# Patient Record
Sex: Male | Born: 1940
Health system: Southern US, Community
[De-identification: ages and names within clinical notes are randomized; demographics above are authoritative.]

## PROBLEM LIST (undated history)

## (undated) DIAGNOSIS — Z9289 Personal history of other medical treatment: Secondary | ICD-10-CM

## (undated) DIAGNOSIS — R001 Bradycardia, unspecified: Secondary | ICD-10-CM

## (undated) DIAGNOSIS — I209 Angina pectoris, unspecified: Secondary | ICD-10-CM

## (undated) DIAGNOSIS — Z85828 Personal history of other malignant neoplasm of skin: Secondary | ICD-10-CM

## (undated) DIAGNOSIS — M1990 Unspecified osteoarthritis, unspecified site: Secondary | ICD-10-CM

## (undated) DIAGNOSIS — L570 Actinic keratosis: Secondary | ICD-10-CM

## (undated) DIAGNOSIS — T148XXA Other injury of unspecified body region, initial encounter: Secondary | ICD-10-CM

## (undated) DIAGNOSIS — I2510 Atherosclerotic heart disease of native coronary artery without angina pectoris: Secondary | ICD-10-CM

## (undated) DIAGNOSIS — I10 Essential (primary) hypertension: Secondary | ICD-10-CM

## (undated) DIAGNOSIS — E785 Hyperlipidemia, unspecified: Secondary | ICD-10-CM

## (undated) DIAGNOSIS — I350 Nonrheumatic aortic (valve) stenosis: Secondary | ICD-10-CM

## (undated) DIAGNOSIS — Z8701 Personal history of pneumonia (recurrent): Secondary | ICD-10-CM

## (undated) HISTORY — DX: Hyperlipidemia, unspecified: E78.5

## (undated) HISTORY — DX: Actinic keratosis: L57.0

## (undated) HISTORY — PX: EYE SURGERY: SHX253

## (undated) HISTORY — DX: Essential (primary) hypertension: I10

## (undated) HISTORY — PX: INGUINAL HERNIA REPAIR: SUR1180

## (undated) HISTORY — DX: Personal history of pneumonia (recurrent): Z87.01

## (undated) HISTORY — DX: Atherosclerotic heart disease of native coronary artery without angina pectoris: I25.10

## (undated) HISTORY — DX: Other injury of unspecified body region, initial encounter: T14.8XXA

## (undated) HISTORY — DX: Nonrheumatic aortic (valve) stenosis: I35.0

## (undated) HISTORY — DX: Personal history of other medical treatment: Z92.89

## (undated) HISTORY — DX: Personal history of other malignant neoplasm of skin: Z85.828

---
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
## 2002-09-14 ENCOUNTER — Encounter: Payer: Self-pay | Admitting: Family Medicine

## 2002-09-14 ENCOUNTER — Encounter: Admission: RE | Admit: 2002-09-14 | Discharge: 2002-09-14 | Payer: Self-pay | Admitting: Family Medicine

## 2002-09-17 ENCOUNTER — Encounter: Admission: RE | Admit: 2002-09-17 | Discharge: 2002-09-17 | Payer: Self-pay | Admitting: Family Medicine

## 2002-09-17 ENCOUNTER — Encounter: Payer: Self-pay | Admitting: Family Medicine

## 2004-04-13 ENCOUNTER — Encounter: Admission: RE | Admit: 2004-04-13 | Discharge: 2004-04-13 | Payer: Self-pay | Admitting: Family Medicine

## 2007-10-13 ENCOUNTER — Encounter: Admission: RE | Admit: 2007-10-13 | Discharge: 2007-10-13 | Payer: Self-pay | Admitting: General Surgery

## 2007-10-17 ENCOUNTER — Encounter (INDEPENDENT_AMBULATORY_CARE_PROVIDER_SITE_OTHER): Payer: Self-pay | Admitting: General Surgery

## 2007-10-17 ENCOUNTER — Ambulatory Visit (HOSPITAL_BASED_OUTPATIENT_CLINIC_OR_DEPARTMENT_OTHER): Admission: RE | Admit: 2007-10-17 | Discharge: 2007-10-17 | Payer: Self-pay | Admitting: General Surgery

## 2008-12-09 ENCOUNTER — Ambulatory Visit: Payer: Self-pay | Admitting: Gastroenterology

## 2008-12-25 ENCOUNTER — Ambulatory Visit: Payer: Self-pay | Admitting: Gastroenterology

## 2010-08-18 ENCOUNTER — Encounter: Payer: Self-pay | Admitting: Cardiology

## 2010-12-11 ENCOUNTER — Encounter: Payer: Self-pay | Admitting: Cardiology

## 2010-12-22 ENCOUNTER — Encounter: Payer: Self-pay | Admitting: Cardiology

## 2010-12-23 ENCOUNTER — Encounter: Payer: Self-pay | Admitting: Cardiology

## 2010-12-23 ENCOUNTER — Ambulatory Visit
Admission: RE | Admit: 2010-12-23 | Discharge: 2010-12-23 | Payer: Self-pay | Source: Home / Self Care | Attending: Cardiology | Admitting: Cardiology

## 2010-12-23 DIAGNOSIS — R079 Chest pain, unspecified: Secondary | ICD-10-CM | POA: Insufficient documentation

## 2010-12-23 DIAGNOSIS — R0989 Other specified symptoms and signs involving the circulatory and respiratory systems: Secondary | ICD-10-CM | POA: Insufficient documentation

## 2010-12-23 DIAGNOSIS — R011 Cardiac murmur, unspecified: Secondary | ICD-10-CM | POA: Insufficient documentation

## 2011-01-04 ENCOUNTER — Telehealth (INDEPENDENT_AMBULATORY_CARE_PROVIDER_SITE_OTHER): Payer: Self-pay | Admitting: Radiology

## 2011-01-05 ENCOUNTER — Other Ambulatory Visit: Payer: Self-pay | Admitting: Cardiology

## 2011-01-05 ENCOUNTER — Ambulatory Visit
Admission: RE | Admit: 2011-01-05 | Discharge: 2011-01-05 | Payer: Self-pay | Source: Home / Self Care | Attending: Cardiology | Admitting: Cardiology

## 2011-01-05 ENCOUNTER — Encounter: Payer: Self-pay | Admitting: Cardiology

## 2011-01-05 ENCOUNTER — Ambulatory Visit: Admission: RE | Admit: 2011-01-05 | Discharge: 2011-01-05 | Payer: Self-pay | Source: Home / Self Care

## 2011-01-05 ENCOUNTER — Encounter (HOSPITAL_COMMUNITY)
Admission: RE | Admit: 2011-01-05 | Discharge: 2011-01-19 | Payer: Self-pay | Source: Home / Self Care | Attending: Cardiology | Admitting: Cardiology

## 2011-01-05 ENCOUNTER — Ambulatory Visit (HOSPITAL_COMMUNITY)
Admission: RE | Admit: 2011-01-05 | Discharge: 2011-01-05 | Payer: Self-pay | Source: Home / Self Care | Attending: Cardiology | Admitting: Cardiology

## 2011-01-05 ENCOUNTER — Encounter (INDEPENDENT_AMBULATORY_CARE_PROVIDER_SITE_OTHER): Payer: Self-pay | Admitting: *Deleted

## 2011-01-05 DIAGNOSIS — R9430 Abnormal result of cardiovascular function study, unspecified: Secondary | ICD-10-CM | POA: Insufficient documentation

## 2011-01-05 DIAGNOSIS — I359 Nonrheumatic aortic valve disorder, unspecified: Secondary | ICD-10-CM | POA: Insufficient documentation

## 2011-01-05 LAB — CBC WITH DIFFERENTIAL/PLATELET
Basophils Absolute: 0 10*3/uL (ref 0.0–0.1)
Basophils Relative: 0.4 % (ref 0.0–3.0)
Eosinophils Absolute: 0.1 10*3/uL (ref 0.0–0.7)
Eosinophils Relative: 1.8 % (ref 0.0–5.0)
HCT: 41 % (ref 39.0–52.0)
Hemoglobin: 14.2 g/dL (ref 13.0–17.0)
Lymphocytes Relative: 25.8 % (ref 12.0–46.0)
Lymphs Abs: 2.1 10*3/uL (ref 0.7–4.0)
MCHC: 34.6 g/dL (ref 30.0–36.0)
MCV: 95 fl (ref 78.0–100.0)
Monocytes Absolute: 0.9 10*3/uL (ref 0.1–1.0)
Monocytes Relative: 11 % (ref 3.0–12.0)
Neutro Abs: 5 10*3/uL (ref 1.4–7.7)
Neutrophils Relative %: 61 % (ref 43.0–77.0)
Platelets: 216 10*3/uL (ref 150.0–400.0)
RBC: 4.32 Mil/uL (ref 4.22–5.81)
RDW: 13.6 % (ref 11.5–14.6)
WBC: 8.3 10*3/uL (ref 4.5–10.5)

## 2011-01-05 LAB — BASIC METABOLIC PANEL
BUN: 14 mg/dL (ref 6–23)
CO2: 30 mEq/L (ref 19–32)
Calcium: 9.7 mg/dL (ref 8.4–10.5)
Chloride: 103 mEq/L (ref 96–112)
Creatinine, Ser: 1.1 mg/dL (ref 0.4–1.5)
GFR: 74.28 mL/min (ref >60.00)
Glucose, Bld: 88 mg/dL (ref 70–99)
Potassium: 5.3 mEq/L — ABNORMAL HIGH (ref 3.5–5.1)
Sodium: 139 mEq/L (ref 135–145)

## 2011-01-05 LAB — PROTIME-INR
INR: 1 ratio (ref 0.8–1.0)
Prothrombin Time: 11.3 s (ref 10.2–12.4)

## 2011-01-05 LAB — APTT: aPTT: 30.1 s — ABNORMAL HIGH (ref 21.7–28.8)

## 2011-01-06 ENCOUNTER — Telehealth: Payer: Self-pay | Admitting: Cardiology

## 2011-01-07 ENCOUNTER — Other Ambulatory Visit: Payer: Self-pay | Admitting: Cardiology

## 2011-01-07 DIAGNOSIS — I719 Aortic aneurysm of unspecified site, without rupture: Secondary | ICD-10-CM

## 2011-01-12 ENCOUNTER — Telehealth: Payer: Self-pay | Admitting: Cardiology

## 2011-01-12 ENCOUNTER — Ambulatory Visit
Admission: RE | Admit: 2011-01-12 | Payer: Self-pay | Source: Home / Self Care | Attending: Cardiology | Admitting: Cardiology

## 2011-01-21 ENCOUNTER — Other Ambulatory Visit: Payer: Self-pay | Admitting: Cardiology

## 2011-01-21 DIAGNOSIS — I719 Aortic aneurysm of unspecified site, without rupture: Secondary | ICD-10-CM

## 2011-01-21 NOTE — Miscellaneous (Signed)
  Clinical Lists Changes  Observations: Added new observation of CT SCAN:  IMPRESSION:   1.  Diverticula in the distal descending colon with minimal   surrounding strandiness suggestive of a very mild diverticulitis.  No   abscess.   2.  Degenerative change in the lower lumbar spine.  (04/13/2004 16:31)      Allergies: 1)  ! Pcn    CT Scan  Procedure date:  04/13/2004  Findings:       IMPRESSION:   1.  Diverticula in the distal descending colon with minimal   surrounding strandiness suggestive of a very mild diverticulitis.  No   abscess.   2.  Degenerative change in the lower lumbar spine.

## 2011-01-21 NOTE — Letter (Signed)
Summary: [facility] - Office Note  [facility] - Office Note   Imported By: Marylou Mccoy 12/22/2010 16:54:15  _____________________________________________________________________  External Attachment:    Type:   Image     Comment:   External Document

## 2011-01-21 NOTE — Letter (Signed)
Summary: [facility] - Office Note  [facility] - Office Note   Imported By: Marylou Mccoy 12/22/2010 16:53:05  _____________________________________________________________________  External Attachment:    Type:   Image     Comment:   External Document

## 2011-01-21 NOTE — Assessment & Plan Note (Signed)
Summary: Cardiology Nuclear Testing  Nuclear Med Background Indications for Stress Test: Evaluation for Ischemia    History Comments: No documented CAD  Symptoms: Chest Pain, Chest Pain with Exertion, Chest Tightness, Chest Tightness with Exertion  Symptoms Comments: CP radiating down lt. arm. Last episode of ZO:[id number].   Nuclear Pre-Procedure Cardiac Risk Factors: Family History - CAD, History of Smoking, Hypertension, TIA Caffeine/Decaff Intake: None NPO After: 8:00 PM Lungs: Slight expiratory wheeze.  O2 Sat 96% on RA. IV 0.9% NS with Angio Cath: 20g     IV Site: R Antecubital IV Started by: Irean Hong, RN Chest Size (in) 44     Height (in): 71 Weight (lb): 191 BMI: 26.74 Tech Comments: Carotid bruit  Nuclear Med Study 1 or 2 day study:  1 day     Stress Test Type:  Stress Reading MD:  Olga Millers, MD     Referring MD:  [name], MD Resting Radionuclide:  Technetium 43m Tetrofosmin     Resting Radionuclide Dose:  11 mCi  Stress Radionuclide:  Technetium 43m Tetrofosmin     Stress Radionuclide Dose:  33 mCi   Stress Protocol Exercise Time (min):  8:01 min     Max HR:  148 bpm     Predicted Max HR:  151 bpm  Max Systolic BP: 193 mm Hg     Percent Max HR:  98.01 %     METS: 10.1 Rate Pressure Product:  98119    Stress Test Technologist:  Rea College, CMA-N     Nuclear Technologist:  Domenic Polite, CNMT  Rest Procedure  Myocardial perfusion imaging was performed at rest 45 minutes following the intravenous administration of Technetium 3m Tetrofosmin.  Stress Procedure  The patient exercised for 8:01.  The patient stopped due to fatigue.  He did c/o chest tightness, 3/10, with exercise.  There were nonspecific ST-T wave changes and occasional PVC's and PAC's.  Technetium 68m Tetrofosmin was injected at peak exercise and myocardial perfusion imaging was performed after a brief delay.  QPS Raw Data Images:  Acquisition technically good; normal left  ventricular size. Stress Images:  There is decreased uptake in the inferior wall Rest Images:  Normal homogeneous uptake in all areas of the myocardium. Subtraction (SDS):  These findings are consistent with mild to moderate ischemia inferior. Transient Ischemic Dilatation:  1.08  (Normal <1.22)  Lung/Heart Ratio:  .27  (Normal <0.45)  Quantitative Gated Spect Images QGS EDV:  95 ml QGS ESV:  36 ml QGS EF:  62 % QGS cine images:  Normal wall motion.   Overall Impression  Exercise Capacity: Good exercise capacity. BP Response: Normal blood pressure response. Clinical Symptoms: There is chest pain ECG Impression: Significant ST abnormalities consistent with ischemia. Overall Impression: Abnormal stress nuclear study with mild to moderate inferior ischemia.  Appended Document: Cardiology Nuclear Testing cath 01/12/11

## 2011-01-21 NOTE — Progress Notes (Signed)
Summary: refill request  Phone Note Refill Request Message from:  Patient on January 06, 2011 10:19 AM  Refills Requested: Medication #1:  VALIUM 5 MG TABS take one tablet 30 minutes prior to MRA. [phone]   Method Requested: Telephone to Pharmacy Initial call taken by: Glynda Jaeger,  January 06, 2011 10:19 AM Caller: [facility] [address] Summary of Call: [phone] [facility]

## 2011-01-21 NOTE — Progress Notes (Signed)
Summary: Pravastatin increased  Phone Note Other Incoming   Caller: Mark in [employer] of Call: Per Loraine Leriche, Dr. Shirlee Latch just cathed the pt and is going to increase Pravastatin to 80mg  once daily. The pt wants this RX sent to [facility] on [address] w/ 3 rf's.  Initial call taken by: Sherri Rad, RN, BSN,  January 12, 2011 12:34 PM    New/Updated Medications: PRAVASTATIN SODIUM 80 MG TABS (PRAVASTATIN SODIUM) Take one tablet by mouth daily at bedtime Prescriptions: PRAVASTATIN SODIUM 80 MG TABS (PRAVASTATIN SODIUM) Take one tablet by mouth daily at bedtime  #90 x 3   Entered by:   Sherri Rad, RN, BSN   Authorized by:   [name], MD   Signed by:   Sherri Rad, RN, BSN on 01/12/2011   Method used:   Electronically to        [facility]  [phone]* (retail)       [address]       [place], [place]  [address]       Ph: [phone] or [phone]       Fax: [phone]   RxID:   [id number]

## 2011-01-21 NOTE — Letter (Signed)
Summary: Cardiac Catheterization Instructions- [facility]  [facility], Main Office  [address]   [place], [place] [address]   Phone: [phone]  Fax: [phone]     01/05/2011 MRN: [id number]  [facility] [address] [place], [place]  [address]  Dear Mr. Travis Armstrong,   You are scheduled for a Cardiac Catheterization on Tues 01/12/11 with Dr. Shirlee Latch.  Please arrive to the 1st floor of the Heart and Vascular Center at [facility] at 7:30 am on the day of your procedure. Please do not arrive before 6:30 a.m. Call the Heart and Vascular Center at [phone] if you are unable to make your appointmnet. The Code to get into the parking garage under the building is 0030. Take the elevators to the 1st floor. You must have someone to drive you home. Someone must be with you for the first 24 hours after you arrive home. Please wear clothes that are easy to get on and off and wear slip-on shoes. Do not eat or drink after midnight except water with your medications that morning. Bring all your medications and current insurance cards with you.  ___ DO NOT take these medications before your procedure: ________________________________________________________________  _x__ Make sure you take your aspirin.  __x_ You may take ALL of your medications with water that morning. ________________________________________________________________________________________________________________________________  ___ DO NOT take ANY medications before your procedure.  ___ Pre-med instructions:  ________________________________________________________________________________________________________________________________  The usual length of stay after your procedure is 2 to 3 hours. This can vary.  If you have any questions, please call the office at the number listed above.   Sherri Rad, RN, BSN

## 2011-01-21 NOTE — Letter (Signed)
Summary: Pre-Cath Orders  Pre-Cath Orders   Imported By: Marylou Mccoy 01/14/2011 10:50:23  _____________________________________________________________________  External Attachment:    Type:   Image     Comment:   External Document

## 2011-01-21 NOTE — Assessment & Plan Note (Signed)
Summary:  f/u echo/cartiod/nuc   Visit Type:  Follow-up Primary Provider:  Benedetto Goad, MD  CC:  chest pain.  History of Present Illness: 70 yo with history of HTN presented initially for evaluation of chest pain.  Patient has been having mild exertional chest pain since 8/11.  It has occurred with splitting wood and after walking about 1/4 mile on flat ground.  Given these exertional symptoms, I set him up for an ETT-myoview.  I reviewed this today.  This showed evidence for ischemia by exercise ECG, and there was a reversible apical perfusion defect suggestive of ischemia as well.  Echo to assess for cause of systolic murmur showed a bicuspid aortic valve with preserved LV systolic function and no significant aortic stenosis.    Labs (10/11): LDL 113, HDL 46, K 5.1, creatinine 1.1, LFTs normal  Current Medications (verified): 1)  Aspirin 81 Mg Tbec (Aspirin) .... Take One Tablet By Mouth Daily 2)  Lisinopril 10 Mg Tabs (Lisinopril) .... Take One Tablet By Mouth Daily 3)  Calcium Carbonate   Powd (Calcium Carbonate) .Marland Kitchen.. 1 Tab Qd 4)  Magnesium 500 Mg Tabs (Magnesium) .Marland Kitchen.. 1 Tab Qd 5)  Loratadine Allergy Relief .Marland Kitchen.. 1 Tab Once Daily 6)  Fish Oil 1000 Mg Caps (Omega-3 Fatty Acids) .... Once Daily 7)  Ocuvite-Lutein  Caps (Multiple Vitamins-Minerals) .... Once Daily  Allergies (verified): 1)  ! Pcn  Past History:  Past Medical History: 1. Actinic Keratosis 2. Multiple Muscle Strains  3. HTN 4. CAD: Anginal-type chest pain.  ETT-myoview (1/12) showed good exercise tolerance but also inferior and V5/V6 1 mm ST depression and chest pain with exercise.  Perfusion images showed reversible apical perfusion defect.  Findings suggest ischemia.   5. Bicuspid aortic valve: Echo (1/12) showed EF 60-65%, bicuspid aortic valve with minimal stenosis, grade II diastolic dysfunction, mild MR, mild LAE.   6. Carotid dopplers (1/12): No significant stenosis.  7.  Hyperlipidemia: Myalgias with Lipitor.     Family History: Reviewed history from 12/23/2010 and no changes required. 2 brothers with CAD diagnosed in their 27s.  Sister with diabetes, died of complications from this disease.   Social History: Reviewed history from 12/23/2010 and no changes required. 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   Vital Signs:  Patient profile:   70 year old male Height:      71 inches Weight:      191 pounds Pulse rate:   72 / minute BP sitting:   132 / 72  (left arm) Cuff size:   large  Vitals Entered By: Marrion Coy, CNA (January 05, 2011 11:18 AM)  Physical Exam  General:  Well developed, well nourished, in no acute distress. Neck:  Neck supple, no JVD. No masses, thyromegaly or abnormal cervical nodes. Lungs:  Clear bilaterally to auscultation and percussion. Heart:  Non-displaced PMI, chest non-tender; regular rate and rhythm, S1, S2.  2/6 systolic crescendo-decrescendo murmur RUSB.  S2 heard clearly. +S4. Carotid upstroke normal, bilateral carotid bruits versus radiated from the heart.  Pedals normal pulses. No edema, no varicosities. Abdomen:  Bowel sounds positive; abdomen soft and non-tender without masses, organomegaly, or hernias noted. No hepatosplenomegaly. Extremities:  No clubbing or cyanosis. Neurologic:  Alert and oriented x 3. Psych:  Normal affect.   Impression & Recommendations:  Problem # 1:  CHEST PAIN (ICD-786.50) Exertional chest pain suggestive of angina.  ETT-myoview suggestive of ischemia.  We discussed the findings and decided on cardiac catheterization to further evaluate.  I discussed with him the 12/998 risk of significant adverse events associated with catheterization.  I will have him continue ASA and lisinopril.  He will begin a statin (pravastatin, has had myalgias with Lipitor).  He  will begin Lopressor 25 mg two times a day to raise anginal threshold.   Problem # 2:  AORTIC VALVE DISORDERS (ICD-424.1) Bicuspid aortic valve with minimal stenosis and no regurgitation.  Given the association with aortic aneurysm, we will need an MRA chest to rule out significant dilatation of the thoracic aorta.    Other Orders: Cardiac Catheterization (Cardiac Cath) MRA (MRA) TLB-BMP (Basic Metabolic Panel-BMET) (80048-METABOL) TLB-CBC Platelet - w/Differential (85025-CBCD) TLB-PT (Protime) (85610-PTP) TLB-PTT (85730-PTTL)  Patient Instructions: 1)  Labwork today: bmet/cbc/pt/ptt (786.50;424.1). 2)  Your physician has requested that you have a cardiac catheterization.  Cardiac catheterization is used to diagnose and/or treat various heart conditions. Doctors may recommend this procedure for a number of different reasons. The most common reason is to evaluate chest pain. Chest pain can be a symptom of coronary artery disease (CAD), and cardiac catheterization can show whether plaque is narrowing or blocking your heart's arteries. This procedure is also used to evaluate the valves, as well as measure the blood flow and oxygen levels in different parts of your heart.  For further information please visit [email or web address]  Please follow instruction sheet, as given. 3)  You will need to have a MRA of your chest. 4)  Start metoprolol tart 25mg  two times a day. 5)  Start Pravastatin 40mg  once daily. 6)  Your physician recommends that you schedule a follow-up appointment in: 3 weeks. Prescriptions: VALIUM 5 MG TABS (DIAZEPAM) take one tablet 30 minutes prior to MRA  #1 x 0   Entered by:   Sherri Rad, RN, BSN   Authorized by:   [name], MD   Signed by:   Sherri Rad, RN, BSN on 01/05/2011   Method used:   Print then Give to Patient   RxID:   [id number] PRAVASTATIN SODIUM 40 MG TABS (PRAVASTATIN SODIUM) Take one tablet by mouth daily at bedtime  #90 x 3   Entered by:    Sherri Rad, RN, BSN   Authorized by:   [name], MD   Signed by:   Sherri Rad, RN, BSN on 01/05/2011   Method used:   Print then Give to Patient   RxID:   [id number] METOPROLOL TARTRATE 25 MG TABS (METOPROLOL TARTRATE) Take one tablet by mouth twice a day  #180 x 3   Entered by:   Sherri Rad, RN, BSN   Authorized by:   [name], MD   Signed by:   Sherri Rad, RN, BSN on 01/05/2011   Method used:   Print then Give to Patient   RxID:   [id number] LISINOPRIL 10 MG TABS (LISINOPRIL) Take one tablet by mouth daily  #90 x 3   Entered by:   Sherri Rad, RN, BSN   Authorized by:   [name], MD   Signed by:   Sherri Rad, RN, BSN on 01/05/2011   Method used:   Print then Give to Patient   RxID:   [id number]

## 2011-01-21 NOTE — Assessment & Plan Note (Signed)
Summary: np6/chest tightness -mb   Visit Type:  np Primary Provider:  Benedetto Goad, MD  CC:  some chest pain, down left arm, and .  History of Present Illness: 70 yo with history of HTN presents for evaluation of chest pain.  In 8/11, patient developed chest tightness that would come on after walking about 1/4 mile.  The pain would resolve with rest.  Nothing else really brought on the pain.  After a month or so, this seemed to subside.  He attributes it to straining his chest and arm working on a lawnmower.  Then about a month ago, he was splitting wood for the first time this year.  1-2 days later, his chest, right arm and right upper back were sore.  This continued on and off for about a week then completely resolved.  He still gets occasional, mild central chest tightness after walking 1/4-1/2 mile.  This now only occurs about once a week.  He actually split wood again last weekend with no chest pain.  He does not get chest pain playing golf or walking up steps.  No exertional dyspnea, no lightheadedness.   ECG: NSR, normal  Labs (10/11): LDL 113, HDL 46, K 5.1, creatinine 1.1, LFTs normal  Current Medications (verified): 1)  Aspirin 81 Mg Tbec (Aspirin) .... Take One Tablet By Mouth Daily 2)  Lisinopril 10 Mg Tabs (Lisinopril) .... Take One Tablet By Mouth Daily 3)  Calcium Carbonate   Powd (Calcium Carbonate) .Marland Kitchen.. 1 Tab Qd 4)  Magnesium 500 Mg Tabs (Magnesium) .Marland Kitchen.. 1 Tab Qd 5)  Loratadine Allergy Relief .Marland Kitchen.. 1 Tab Once Daily 6)  Fish Oil 1000 Mg Caps (Omega-3 Fatty Acids) .... Once Daily 7)  Ocuvite-Lutein  Caps (Multiple Vitamins-Minerals) .... Once Daily  Allergies (verified): 1)  ! Pcn  Past History:  Family History: Last updated: 12/23/2010 2 brothers with CAD diagnosed in their 36s.  Sister with diabetes, died of complications from this disease.   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 Actinic Keratosis 2. Multiple Muscle Strains  3. HTN 4. Chest pain  Family History: 2 brothers with CAD diagnosed in their 32s.  Sister with diabetes, died of complications from this disease.   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   Vital Signs:  Patient profile:   70 year old male Height:      71 inches Weight:      199 pounds BMI:     27.86 Pulse rate:   74 / minute BP sitting:   126 / 72  (left arm) Cuff size:   regular  Vitals Entered By: Caralee Ates CMA (December 23, 2010 1:48 PM)  Physical Exam  General:  Well developed, well nourished, in no acute distress. Head:  normocephalic and atraumatic Nose:  no deformity, discharge, inflammation, or lesions Mouth:  Teeth, gums and palate normal. Oral mucosa normal. Neck:  Neck supple, no JVD. No masses, thyromegaly or abnormal cervical nodes. Lungs:  Clear bilaterally to auscultation and percussion. Heart:  Non-displaced PMI, chest non-tender; regular rate and rhythm, S1, S2.  2/6 systolic crescendo-decrescendo murmur RUSB.  S2 heard clearly. +S4. Carotid upstroke  normal, bilateral carotid bruits versus radiated from the heart.  Pedals normal pulses. No edema, no varicosities. Abdomen:  Bowel sounds positive; abdomen soft and non-tender without masses, organomegaly, or hernias noted. No hepatosplenomegaly. Msk:  Back normal, normal gait. Muscle strength and tone normal. Extremities:  No clubbing or cyanosis. Neurologic:  Alert and oriented x 3. Skin:  Intact without lesions or rashes. Psych:  Normal affect.   Impression & Recommendations:  Problem # 1:  CHEST PAIN (ICD-786.50) Patient has exertional chest pain.  This seems to be rather mild and only  occurs about once a week now.  It always resolves with rest.  It happened more often in 8/11 and 9/11 but seems to have lessened considerably since then. The pain he got about a month ago chopping wood may have been muscle strain.   Overall, he has good exercise tolerance.  The chest tightness with walking certainly could represent mild angina.  I am going to get an ETT-myoview to risk stratify.  If myoview is normal or low risk, will treat medically.  Continue ASA.  May start statin/beta blocker but will await results of workup.   Problem # 2:  CARDIAC MURMUR (ICD-785.2) Aortic area murmur.  Will get echo to assess.   Problem # 3:  CAROTID BRUIT (ICD-785.9) Carotid bruits versus radiation from heart murmur.  Will get carotid dopplers.  No stroke-like symptoms.   Other Orders: Nuclear Stress Test (Nuc Stress Test) Echocardiogram (Echo) Carotid Duplex (Carotid Duplex)  Patient Instructions: 1)  Your physician has requested that you have an echocardiogram.  Echocardiography is a painless test that uses sound waves to create images of your heart. It provides your doctor with information about the size and shape of your heart and how well your heart's chambers and valves are working.  This procedure takes approximately one hour. There are no restrictions for this procedure. 2)  Your physician has requested that you have a carotid duplex. This test is an ultrasound of the carotid arteries in your neck. It looks at blood flow through these arteries that supply the brain with blood. Allow one hour for this exam. There are no restrictions or special instructions. 3)  Your physician has requested that you have an exercise stress myoview.  For further information please visit [email or web address]  Please follow instruction sheet, as given. 4)  Your physician recommends that you schedule a follow-up appointment in: 2 weeks with Dr Shirlee Latch.

## 2011-01-21 NOTE — Progress Notes (Signed)
Summary: nuc pre-procedure  Phone Note Outgoing Call   Call placed by: Domenic Polite, CNMT,  January 04, 2011 11:37 AM Call placed to: Patient Reason for Call: Confirm/change Appt Summary of Call: Reviewed information on Myoview Information Sheet (see scanned document for further details).  Spoke with patient's wife.      Nuclear Med Background Indications for Stress Test: Evaluation for Ischemia     Symptoms: Chest Pain, Chest Tightness with Exertion  Symptoms Comments: CP radiating down lt. arm   Nuclear Pre-Procedure Cardiac Risk Factors: Family History - CAD, History of Smoking, Hypertension Height (in): 71 Tech Comments: Carotid Bruit

## 2011-01-25 ENCOUNTER — Other Ambulatory Visit: Payer: Self-pay | Admitting: Family Medicine

## 2011-01-25 ENCOUNTER — Other Ambulatory Visit (HOSPITAL_COMMUNITY): Payer: Self-pay | Admitting: Oncology

## 2011-01-27 ENCOUNTER — Ambulatory Visit (HOSPITAL_COMMUNITY)
Admission: RE | Admit: 2011-01-27 | Discharge: 2011-01-27 | Disposition: A | Discharge status: Home / Self Care | Payer: Medicare Other | Source: Ambulatory Visit | Attending: Cardiology | Admitting: Cardiology

## 2011-01-27 ENCOUNTER — Inpatient Hospital Stay (HOSPITAL_COMMUNITY): Admission: RE | Admit: 2011-01-27 | Payer: Self-pay | Source: Ambulatory Visit

## 2011-01-27 DIAGNOSIS — Q231 Congenital insufficiency of aortic valve: Secondary | ICD-10-CM | POA: Insufficient documentation

## 2011-01-27 DIAGNOSIS — I719 Aortic aneurysm of unspecified site, without rupture: Secondary | ICD-10-CM

## 2011-01-27 MED ORDER — GADOBENATE DIMEGLUMINE 529 MG/ML IV SOLN
20.0000 mL | Freq: Once | INTRAVENOUS | Status: AC
  Administered 2011-01-27: 20 mL via INTRAVENOUS

## 2011-01-28 ENCOUNTER — Other Ambulatory Visit (HOSPITAL_COMMUNITY): Payer: Self-pay

## 2011-01-28 NOTE — Procedures (Signed)
NAMEJAMAEL, Travis Armstrong                  ACCOUNT NO.:  [id number]  MEDICAL RECORD NO.:  [id number]          PATIENT TYPE:  OIB  LOCATION:  1962                         FACILITY:  [facility]  PHYSICIAN:  [name], MD      DATE OF BIRTH:  28-Jun-1941  DATE OF PROCEDURE:  01/12/2011 DATE OF DISCHARGE:  01/12/2011                           CARDIAC CATHETERIZATION   PROCEDURES: 1. Left heart catheterization. 2. Coronary angiography 3. Left Ventriculography.  INDICATIONS:  This is a 70 year old with a history of stable exertional chest pain and abnormal Myoview suggesting apical ischemia.  PROCEDURE NOTE:  After informed consent was obtained, the patient underwent Allen testing on his right wrist.  This showed good collateral circulation from the ulnar artery to the radial side of the hand.  The patient's right wrist was then sterilely prepped and draped.  Lidocaine 1% was used to locally anesthetize the right radial area.  The right radial artery was entered using modified Seldinger technique and a 5- French arterial sheath was placed.  The patient then received 3 mg of intraarterial verapamil and 4000 units of IV heparin.  The right coronary artery was engaged using a JR-4 catheter and the left coronary artery was engaged using the JL-3.5 catheter.  Left ventricle was entered using angled pigtail catheter.  There were no complications.  FINDINGS: 1. Hemodynamics:  LV 110/22, aorta 97/51, mean gradient across the     aortic valve was 11 mmHg consistent with known mild aortic stenosis     from echocardiogram. 2. Left ventriculography:  EF was estimated to be 55% with no regional     wall motion abnormalities in the RAO projection.  The aortic valve     is bicuspid. 3. Right coronary artery:  The right coronary artery has luminal     irregularities and was a dominant vessel.  There was about 30%     proximal PDA stenosis. 4. Left main:  The left main had about 25% midvessel stenosis and  was     a fairly long vessel. 5. Left circumflex system:  The AV circumflex itself was a small     vessel with about 30% ostial stenosis and luminal irregularities     throughout it body.  There was a large ramus intermedius that     branched several times.  There was about 30% proximal stenosis in     this vessel. 6. LAD system:  The LAD was fairly heavily calcified with luminal     irregularities only in the proximal portion of the vessel.  Two     small to moderate diagonals came off the proximal LAD.  After the     second diagonal and the mid LAD, there was a long calcified up to     90% stenosis in the vessel.  The vessel was relatively small at     this point from 2-0.5 mm.  We did give intraarterial nitroglycerin     without much change in vessel caliber.  IMPRESSION:  The patient has calcified left anterior descending with up to 90% midvessel stenosis after the second diagonal.  The left anterior descending is small, measuring 2-0.5 mm.  I discussed the situation with Dr. Clifton James.  Given the small size of the vessel and the fact the patient has stable exertional pain with moderate activity, we will try medical management to begin with.  The patient actually has had significantly less chest pain since starting on metoprolol, therefore we will continue metoprolol at 25 mg b.i.d. and increase his pravastatin to 80 mg daily as he has tolerated 40 mg daily.     [name], MD     DM/MEDQ  D:  01/12/2011  T:  01/12/2011  Job:  [id number]  cc:   Gloriajean Dell. Andrey Campanile, M.D.  Electronically Signed by [name] MD on 01/28/2011 08:34:55 AM

## 2011-01-30 ENCOUNTER — Other Ambulatory Visit (HOSPITAL_COMMUNITY): Payer: Self-pay

## 2011-02-01 ENCOUNTER — Ambulatory Visit: Payer: Self-pay | Admitting: Cardiology

## 2011-02-01 ENCOUNTER — Encounter: Payer: Self-pay | Admitting: Cardiology

## 2011-02-01 ENCOUNTER — Ambulatory Visit (INDEPENDENT_AMBULATORY_CARE_PROVIDER_SITE_OTHER): Payer: Medicare Other | Admitting: Cardiology

## 2011-02-01 DIAGNOSIS — I2510 Atherosclerotic heart disease of native coronary artery without angina pectoris: Secondary | ICD-10-CM

## 2011-02-01 DIAGNOSIS — E785 Hyperlipidemia, unspecified: Secondary | ICD-10-CM | POA: Insufficient documentation

## 2011-02-05 ENCOUNTER — Other Ambulatory Visit (HOSPITAL_COMMUNITY): Payer: Self-pay

## 2011-02-10 NOTE — Assessment & Plan Note (Signed)
Summary: F3W/10:45/PER CHECK OUT/F/U MRA/SAF/AMD   Primary Provider:  Benedetto Goad, MD  CC:  F/U 3 WEEKS.  History of Present Illness: 70 yo with history of HTN presented initially for evaluation of chest pain.  Patient had been having mild exertional chest pain since 8/11.  It occurred with splitting wood and after walking about 1/4 mile on flat ground.  Given these exertional symptoms, I set him up for an ETT-myoview. This showed evidence for ischemia by exercise ECG, and there was a reversible inferior perfusion defect suggestive of ischemia as well.  Echo to assess for cause of systolic murmur showed a bicuspid aortic valve with preserved LV systolic function and no significant aortic stenosis.  I set him up for a left heart cath in 1/12, which showed heavy calcification in the LAD.  There was a long 90% mid LAD stenosis.  The LAD was small at this point, a 2-2.5 mm vessel.  After discussion with Dr. Clifton James, I decided to proceed with medical treatment as his symptoms were only with moderate exertion and as the LAD was a small caliber vessel at the site of the stenosis.  Given the bicuspid aortic valve, I did an MR angiogram of the thoracic aorta, which ruied out aortic aneurysm.    After the cath, I increased Mr Burdo pravastatin to 80 mg daily and started him on metoprolol 25 mg two times a day.  SInce that time, he has had no further chest pain.  He does occasionally get lightheaded immediately upon standing up.  This tends to resolve quickly.  No falls or syncope. HR is 49 today but has been running in the 50s-60s at home.   Labs (10/11): LDL 113, HDL 46, K 5.1, creatinine 1.1, LFTs normal Labs (1/12): K 5.3, creatinine 1.1  Current Medications (verified): 1)  Aspirin 81 Mg Tbec (Aspirin) .... Take One Tablet By Mouth Daily 2)  Lisinopril 10 Mg Tabs (Lisinopril) .... Take One Tablet By Mouth Daily 3)  Calcium Carbonate   Powd (Calcium Carbonate) .Marland Kitchen.. 1 Tab Qd 4)  Magnesium 500 Mg Tabs  (Magnesium) .Marland Kitchen.. 1 Tab Qd 5)  Loratadine Allergy Relief .Marland Kitchen.. 1 Tab Once Daily 6)  Fish Oil 1000 Mg Caps (Omega-3 Fatty Acids) .... Once Daily 7)  Ocuvite-Lutein  Caps (Multiple Vitamins-Minerals) .... 2 Capsules Once Daily 8)  Metoprolol Tartrate 25 Mg Tabs (Metoprolol Tartrate) .... Take One Tablet By Mouth Twice A Day 9)  Pravastatin Sodium 80 Mg Tabs (Pravastatin Sodium) .... Take One Tablet By Mouth Daily At Bedtime  Allergies (verified): 1)  ! Pcn  Past History:  Past Medical History: 1. Actinic Keratosis 2. Muscle strains  3. HTN 4. CAD: Anginal-type chest pain.  ETT-myoview (1/12) showed good exercise tolerance but also inferior and V5/V6 1 mm ST depression and chest pain with exercise.  Perfusion images showed reversible apical perfusion defect.  Findings suggested ischemia.  LHC (1/12): LAD heavily calcified with 90% calcified mid LAD stenosis after D2.  The LAD was small caliber (2-2.5 mm) at this point.  Because of this and his lack of unstable symptoms, I elected to manage him medically initially.  5. Bicuspid aortic valve: Echo (1/12) showed EF 60-65%, bicuspid aortic valve with minimal stenosis (mean gradient 9 mmHg), grade II diastolic dysfunction, mild MR, mild LAE.  MR angiogram chest: No thoracic aortic aneurysm.   6. Carotid dopplers (1/12): No significant stenosis.  7.  Hyperlipidemia: Myalgias with Lipitor.   Family History: Reviewed history from 12/23/2010 and no  changes required. 2 brothers with CAD diagnosed in their 1s.  Sister with diabetes, died of complications from this disease.   Social History: Reviewed history from 12/23/2010 and no changes required. 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   Vital Signs:  Patient profile:   70 year old male Height:      71 inches Weight:       200 pounds BMI:     28.00 O2 Sat:      98 % on Room air Pulse rate:   49 / minute Pulse rhythm:   regular BP sitting:   118 / 66  (left arm) Cuff size:   large  Vitals Entered By: Judithe Modest CMA (February 01, 2011 11:16 AM)  O2 Flow:  Room air  Physical Exam  General:  Well developed, well nourished, in no acute distress. Neck:  Neck supple, no JVD. No masses, thyromegaly or abnormal cervical nodes. Lungs:  Clear bilaterally to auscultation and percussion. Heart:  Non-displaced PMI, chest non-tender; regular rate and rhythm, S1, S2.  2/6 systolic crescendo-decrescendo murmur RUSB.  S2 heard clearly. +S4. Carotid upstroke normal, bilateral carotid bruits versus radiated from the heart.  Pedals normal pulses. No edema, no varicosities. Abdomen:  Bowel sounds positive; abdomen soft and non-tender without masses, organomegaly, or hernias noted. No hepatosplenomegaly. Extremities:  No clubbing or cyanosis. Neurologic:  Alert and oriented x 3. Psych:  Normal affect.   Impression & Recommendations:  Problem # 1:  AORTIC VALVE DISORDERS (ICD-424.1) Bicuspid aortic valve with minimal stenosis and no associated thoracic aortic aneurysm.  Will need to monitor valve probably every other year unless stenosis appears progressive.   Problem # 2:  CHEST PAIN (ICD-786.50) Coronary disease with 90% calcified mid LAD stenosis.  The vessel was small caliber at this point.  Given the small-sized vessel and stable symptoms, I planned to manage him medically initially.  After starting metoprolol, exertional chest pain seems to have resolved.  He has very mild orthostatic symptoms that are not causing him any significant problem. Continue ASA, statin, ACEI, metoprolol.  If exertional chest pain returns/worsens, PCI would be an option but with higher risk of need for revascularization down the road given the small caliber vessel.    Problem # 3:  HYPERLIPIDEMIA-MIXED (ICD-272.4) He is tolerating  pravastatin without recurrent myalgias.  Needs repeat lipids/LFTs in 3/12. We discussed a heart healthy diet for him to follow.    Followup in 3 months unless concerning symptoms develop.   Patient Instructions: 1)  Your physician recommends that you return for a FASTING lipid profile/liver profile   March 2012--414.01 2)  Your physician recommends that you schedule a follow-up appointment in: 3 months with Dr Shirlee Latch.

## 2011-02-25 NOTE — Progress Notes (Signed)
Summary: [facility]: Office Visit  [facility]: Office Visit   Imported By: Earl Many 02/15/2011 17:53:42  _____________________________________________________________________  External Attachment:    Type:   Image     Comment:   External Document

## 2011-02-25 NOTE — Progress Notes (Signed)
Summary: [facility]: Office Visit  [facility]: Office Visit   Imported By: Earl Many 02/15/2011 18:01:15  _____________________________________________________________________  External Attachment:    Type:   Image     Comment:   External Document

## 2011-02-26 ENCOUNTER — Telehealth: Payer: Self-pay | Admitting: Cardiology

## 2011-03-02 ENCOUNTER — Other Ambulatory Visit: Payer: Self-pay | Admitting: Cardiology

## 2011-03-02 ENCOUNTER — Other Ambulatory Visit (INDEPENDENT_AMBULATORY_CARE_PROVIDER_SITE_OTHER): Payer: Medicare Other

## 2011-03-02 ENCOUNTER — Encounter: Payer: Self-pay | Admitting: Cardiology

## 2011-03-02 DIAGNOSIS — I2510 Atherosclerotic heart disease of native coronary artery without angina pectoris: Secondary | ICD-10-CM

## 2011-03-02 LAB — HEPATIC FUNCTION PANEL
ALT: 19 U/L (ref 0–53)
Bilirubin, Direct: 0.2 mg/dL (ref 0.0–0.3)
Total Protein: 6.3 g/dL (ref 6.0–8.3)

## 2011-03-02 LAB — LIPID PANEL
Cholesterol: 101 mg/dL (ref 0–200)
HDL: 37.1 mg/dL — ABNORMAL LOW (ref >39.00)
LDL Cholesterol: 51 mg/dL (ref 0–99)
VLDL: 12.8 mg/dL (ref 0.0–40.0)

## 2011-03-02 NOTE — Progress Notes (Signed)
Summary: pt needs to discuss medication  Phone Note Call from Patient Call back at Home Phone [phone]   Caller: Patient Reason for Call: Talk to Nurse, Talk to Doctor Summary of Call: pt has a medication question cause when dose went up price went up on pravastatin and he wants to get lower dose and double the amount so it wont cost Initial call taken by: Omer Jack,  February 26, 2011 10:06 AM  Follow-up for Phone Call        Phone Call Completed PT WANTING PRAVASTAIN 40 MG  CALLED INTO [facility] WILL HAVE TO TAKE 2 TABS TO EQUAL THE  80 MG PT AWARE TOHE COST OF 80 MG WENT UP TO $52.00 FOR 90 DAY SUPPLY  WAS TOLD CAN GET THE  40 MG FOR $10.00 FOR 90 DAY SUPPLY Follow-up by: Scherrie Bateman, LPN,  February 26, 2011 11:18 AM    New/Updated Medications: PRAVASTATIN SODIUM 40 MG TABS (PRAVASTATIN SODIUM) 2 TABS once daily Prescriptions: PRAVASTATIN SODIUM 40 MG TABS (PRAVASTATIN SODIUM) 2 TABS once daily  #180 x 3   Entered by:   Scherrie Bateman, LPN   Authorized by:   [name], MD   Signed by:   Scherrie Bateman, LPN on 09/81/1914   Method used:   Electronically to        [facility]  [phone]* (retail)       [address]       [place], [place]  [address]       Ph: [phone] or [phone]       Fax: [phone]   RxID:   [id number]   Appended Document: pt needs to discuss medication That is fine.

## 2011-03-04 ENCOUNTER — Telehealth: Payer: Self-pay | Admitting: Cardiology

## 2011-03-08 ENCOUNTER — Telehealth: Payer: Self-pay | Admitting: Cardiology

## 2011-03-09 NOTE — Progress Notes (Addendum)
Summary: pt calling re dizzy spells  Phone Note Call from Patient   Caller: Patient [phone] Reason for Call: Talk to Nurse Summary of Call: pt having dizzy spells -last fews days after sitting or lying down-wonders if med Initial call taken by: Glynda Jaeger,  March 04, 2011 1:42 PM  Follow-up for Phone Call        pt states he has had problems with dizziness for the past week or so----he seems to be OK when he first gets up in the morning but during the day has dizziness--he states he felt dizzy while playing golf today--when he got home he checked his BP and it was 113/60 pulse 51--the dizziness had resolved by this time-he states he never feels like he is going to pass out and the dizziness seems to be short lasting--he states he felt this way some when he saw Dr Shirlee Latch in February and the dizziness had gotten better--he is asking if any of his medications may be the cause of the dizziness and if they should be adjusted--I will forward to Dr Shirlee Latch for review-     Appended Document: pt calling re dizzy spells Cut metoprolol in half to 12.5 mg two times a day.   Appended Document: pt calling re dizzy spells Tried to contact patient-no answer.  Will continue to try patient.   Appended Document: pt calling re dizzy spells LMTCB  Appended Document: pt calling re dizzy spells Left message to call back   Appended Document: pt calling re dizzy spells I talked with pt--he will decrease Metoprolol to 12.5mg  twice a day   Clinical Lists Changes  Medications: Changed medication from METOPROLOL TARTRATE 25 MG TABS (METOPROLOL TARTRATE) Take one tablet by mouth twice a day to METOPROLOL TARTRATE 25 MG TABS (METOPROLOL TARTRATE) Take one-half  tablet by mouth twice a day Observations: Added new observation of MEDRECON: current updated (03/08/2011 9:39)       Current Medications (verified): 1)  Aspirin 81 Mg Tbec (Aspirin) .... Take One Tablet By Mouth Daily 2)  Lisinopril 10 Mg  Tabs (Lisinopril) .... Take One Tablet By Mouth Daily 3)  Calcium Carbonate   Powd (Calcium Carbonate) .Marland Kitchen.. 1 Tab Qd 4)  Magnesium 500 Mg Tabs (Magnesium) .Marland Kitchen.. 1 Tab Qd 5)  Loratadine Allergy Relief .Marland Kitchen.. 1 Tab Once Daily 6)  Fish Oil 1000 Mg Caps (Omega-3 Fatty Acids) .... Once Daily 7)  Ocuvite-Lutein  Caps (Multiple Vitamins-Minerals) .... 2 Capsules Once Daily 8)  Metoprolol Tartrate 25 Mg Tabs (Metoprolol Tartrate) .... Take One-Half  Tablet By Mouth Twice A Day 9)  Pravastatin Sodium 40 Mg Tabs (Pravastatin Sodium) .... 2 Tabs Once Daily  Allergies: 1)  ! Pcn

## 2011-03-18 NOTE — Progress Notes (Signed)
Summary: pt rtn call from friday  Phone Note Call from Patient Call back at Home Phone [phone]   Caller: Patient Reason for Call: Talk to Nurse, Talk to Doctor Summary of Call: pt rtn call from friday Initial call taken by: Omer Jack,  March 08, 2011 8:32 AM  Follow-up for Phone Call        I talked with pt--see phone note 03/04/11

## 2011-05-01 ENCOUNTER — Encounter: Payer: Self-pay | Admitting: *Deleted

## 2011-05-01 ENCOUNTER — Encounter: Payer: Self-pay | Admitting: Cardiology

## 2011-05-04 ENCOUNTER — Ambulatory Visit (INDEPENDENT_AMBULATORY_CARE_PROVIDER_SITE_OTHER): Payer: Medicare Other | Admitting: Cardiology

## 2011-05-04 VITALS — BP 137/76 | HR 58 | Ht 71.0 in | Wt 192.0 lb

## 2011-05-04 DIAGNOSIS — I2510 Atherosclerotic heart disease of native coronary artery without angina pectoris: Secondary | ICD-10-CM

## 2011-05-04 DIAGNOSIS — I498 Other specified cardiac arrhythmias: Secondary | ICD-10-CM

## 2011-05-04 DIAGNOSIS — R001 Bradycardia, unspecified: Secondary | ICD-10-CM

## 2011-05-04 DIAGNOSIS — E785 Hyperlipidemia, unspecified: Secondary | ICD-10-CM

## 2011-05-04 DIAGNOSIS — R0989 Other specified symptoms and signs involving the circulatory and respiratory systems: Secondary | ICD-10-CM

## 2011-05-04 DIAGNOSIS — I359 Nonrheumatic aortic valve disorder, unspecified: Secondary | ICD-10-CM

## 2011-05-04 NOTE — Patient Instructions (Signed)
Your physician wants you to follow-up in: 6 months with Dr Shirlee Latch. (November 2012). You will receive a reminder letter in the mail two months in advance. If you don't receive a letter, please call our office to schedule the follow-up appointment.

## 2011-05-04 NOTE — Op Note (Signed)
NAMEDEMPSEY, AHONEN                  ACCOUNT NO.:  [id number]   MEDICAL RECORD NO.:  [id number]          PATIENT TYPE:  AMB   LOCATION:  [facility]                         FACILITY:  [facility]   PHYSICIAN:  Anselm Pancoast. Weatherly, M.D.DATE OF BIRTH:  Feb 11, 1941   DATE OF PROCEDURE:  10/17/2007  DATE OF DISCHARGE:                               OPERATIVE REPORT   PREOPERATIVE DIAGNOSIS:  Left inguinal hernia.   POSTOPERATIVE DIAGNOSIS:  Left inguinal hernia, it was an indirect.   OPERATION:  Left inguinal herniorrhaphy with mesh reinforcement.   Local with sedation.   SURGEON:  Anselm Pancoast. Zachery Dakins, M.D.   HISTORY:  Travis Armstrong is a 70 year old male who was referred to me by Dr.  Andrey Campanile at [facility] after the patient presented there.  He said for about a year he had had a little bit of discomfort in the  left groin.  Over the last few months he had been doing some strenuous  activities, doing some landscape work, Catering manager., and then was noticing that  he was definitely having a bulge in the left groin.  He had previously  been a Naval architect at [employer] and was not significantly overweight.  I could feel a definite indirect or weakness protruding down through the  external ring area and recommended that we repair this with local and  sedation.  He is here for the planned procedure.  His laboratory studies  and chest x-rays were all normal and an EKG was unremarkable.   He was given 400 mg of Cipro, being allergic to PENICILLIN.  The patient  was taken to the operative suite.  Sedation was given intravenously by  Dr. Rica Mast and then the left groin area had been marked and the patient  identified with the time-out, etc., and then we clipped the pubic hair  and then prepped him with Betadine surgical solution and draped him in a  sterile manner.  I first anesthetized the skin where the incision would  be with the Marcaine mixture 0.5% with 0.25% Xylocaine plain, and the  ilioinguinal nerve area was infiltrated with a blunted 21-gauge needle,  about 10 mL total used, all total about probably 25-30 mL of solution  was used.  Sharp dissection down through the skin, subcutaneous tissue,  the external oblique fascia identified, and then we opened this through  the external ring.  There was a little ilioinguinal nerve that came up  high, then went inferiorly, and we were able to kind of protect that  during the dissection.  I could elevate the cord structures.  There was  a little vein down at the base that was divided after clamping it,  ligated with fine Vicryl and then the cord structure was elevated.  There was an indirect hernia sac about 5-6 inches in length that was  separated from the cord structures, the sac opened, and then a high sac  ligation performed with 2-0 Surgilon.  I put a second suture just distal  and then removed the hernia sac.  The cord structures were under direct  vision and  I did not think we compromised them in any way.  Next I  reinforced the floor, this is predominantly an indirect hernia, with  kind of a running 2-0 Prolene starting at the symphysis, kind of  reconstructed the internal ring area, and then going back and suturing  the two ends together.  A piece of Prolene mesh shaped like a sail, slit  laterally, was then used to reinforce the floor starting at the  symphysis pubis and sutured in a with a running 2-0 Prolene.  The two  tails were then placed around the internal ring and sutured together  laterally but I did not get them tight at the internal ring area.  I  then used interrupted 2-0 Prolenes on the superior flaps, suturing them  to the conjoined tendon and rectus area and is lying flat and providing  __________  tension.  The little nerve that we had protected was still  intact.  The main iliohypogastric mesh is lying under that superiorly.  I then closed the external oblique with a running 2-0 Vicryl, Scarpa   fascia was closed using interrupted 2-0 Vicryl, 4-0 Dexon subcuticular,  Benzoin and Steri-Strips on the skin.  The testis was in its normal  position, and  we will release him after a short stay in recovery.  I will see him back  in the office in approximately 10 days.  He should not drive for the  next 2 or 3 days and limited activities, waiting about a month before  doing any real strenuous activities again.           ______________________________  Anselm Pancoast. Zachery Dakins, M.D.     WJW/MEDQ  D:  10/17/2007  T:  10/17/2007  Job:  [id number]   cc:   Gloriajean Dell. Andrey Campanile, M.D.  Fax: [phone]

## 2011-05-05 ENCOUNTER — Encounter: Payer: Self-pay | Admitting: Cardiology

## 2011-05-05 DIAGNOSIS — R001 Bradycardia, unspecified: Secondary | ICD-10-CM | POA: Insufficient documentation

## 2011-05-05 DIAGNOSIS — I2510 Atherosclerotic heart disease of native coronary artery without angina pectoris: Secondary | ICD-10-CM | POA: Insufficient documentation

## 2011-05-05 NOTE — Assessment & Plan Note (Signed)
Referred from AS murmur probably.  No significant stenosis on carotid dopplers.

## 2011-05-05 NOTE — Assessment & Plan Note (Signed)
Patient is mildly bradycardic.  Angina is well-suppressed with metoprolol.  We discussed changing metoprolol to Imdur but will continue metoprolol for now.  If he gets more frequent orthostatic-type lightheadedness, will stop metoprolol.

## 2011-05-05 NOTE — Assessment & Plan Note (Signed)
Coronary disease with 90% calcified mid LAD stenosis.  The vessel was small caliber at this point.  Given the small-sized vessel and stable symptoms, I planned to manage him medically initially.  After starting metoprolol, exertional chest pain seems to have resolved.  He has very mild orthostatic symptoms rarely that are not causing him any significant problem and he is not orthostatic in the office. Continue ASA, statin, ACEI, metoprolol at lower dose.  If exertional chest pain returns/worsens, PCI would be an option but with higher risk of need for revascularization down the road given the small caliber vessel.

## 2011-05-05 NOTE — Progress Notes (Signed)
PCP: Dr. Andrey Campanile  70 yo with history of HTN presented initially for evaluation of chest pain.  Patient had been having mild exertional chest pain since 8/11.  It occurred with splitting wood and after walking about 1/4 mile on flat ground.  Given these exertional symptoms, I set him up for an ETT-myoview. This showed evidence for ischemia by exercise ECG, and there was a reversible inferior perfusion defect suggestive of ischemia as well.  Echo to assess for cause of systolic murmur showed a bicuspid aortic valve with preserved LV systolic function and no significant aortic stenosis.  I set him up for a left heart cath in 1/12, which showed heavy calcification in the LAD.  There was a long 90% mid LAD stenosis.  The LAD was small at this point, a 2-2.5 mm vessel.  After discussion with Dr. Clifton James, I decided to proceed with medical treatment as his symptoms were only with moderate exertion and as the LAD was a small caliber vessel at the site of the stenosis.  Given the bicuspid aortic valve, I did an MR angiogram of the thoracic aorta, which ruied out aortic aneurysm.    After the cath, I had started Mr. Varkey on metoprolol 25 mg bid.  His HR was in the 50s and he reported occasional lightheadedness with standing, so I cut back on his metoprolol to 12.5 mg bid.  Today, he is doing well in general.  He still occasionally gets lightheaded with standing but not as significantly as before cutting down on metoprolol.  HR is in the high 40s-50s today.  He was not orthostatic when we checked in the office today.  Patient has had no chest pain or exertional dyspnea.  He just finished competing in the senior games where he ran track, played golf and horseshoes.  He got the silver medal in the 100 meter run.  He plays golf regularly and sometimes walks the course.  He feels like his exercise tolerance is excellent.  Labs (10/11): LDL 113, HDL 46, K 5.1, creatinine 1.1, LFTs normal Labs (1/12): K 5.3, creatinine  1.1 Labs (3/12): LDL 51, HDL 37  ECG: NSR at 48  Allergies (verified):  1)  ! Pcn  Past Medical History: 1. Actinic Keratosis 2. Muscle strains  3. HTN 4. CAD: Anginal-type chest pain.  ETT-myoview (1/12) showed good exercise tolerance but also inferior and V5/V6 1 mm ST depression and chest pain with exercise.  Perfusion images showed reversible apical perfusion defect.  Findings suggested ischemia.  LHC (1/12): LAD heavily calcified with 90% calcified mid LAD stenosis after D2.  The LAD was small caliber (2-2.5 mm) at this point.  Because of this and his lack of unstable symptoms, I elected to manage him medically initially.  5. Bicuspid aortic valve: Echo (1/12) showed EF 60-65%, bicuspid aortic valve with minimal stenosis (mean gradient 9 mmHg), grade II diastolic dysfunction, mild MR, mild LAE.  MR angiogram chest: No thoracic aortic aneurysm.   6. Carotid dopplers (1/12): No significant stenosis.  7.  Hyperlipidemia: Myalgias with Lipitor.  8.  Mild bradycardia  Family History: 2 brothers with CAD diagnosed in their 59s.  Sister with diabetes, died of complications from this disease.   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 aspirin 81 MG tablet Take 81 mg by mouth daily.        Marland Kitchen  lisinopril (PRINIVIL,ZESTRIL) 10 MG tablet Take 10 mg by mouth daily.        . magnesium gluconate (MAGONATE) 500 MG tablet Take 500 mg by mouth daily.        . metoprolol tartrate (LOPRESSOR) 25 MG tablet 1/2 tab po bid       . Multiple Vitamins-Minerals (OCUVITE PO) 2 tabs po qd       . Omega-3 Fatty Acids (FISH OIL) 1000 MG CAPS 1 tab po qd       . pravastatin (PRAVACHOL) 40 MG tablet 2 tabs po qd         BP 137/76  Pulse 58  Ht 5\' 11"  (1.803 m)  Wt 192 lb (87.091 kg)  BMI 26.78 kg/m2 General:  Well developed,  well nourished, in no acute distress. Neck:  Neck supple, no JVD. No masses, thyromegaly or abnormal cervical nodes. Lungs:  Clear bilaterally to auscultation and percussion. Heart:  Non-displaced PMI, chest non-tender; regular rate and rhythm, S1, S2.  2/6 systolic crescendo-decrescendo murmur RUSB.  S2 heard clearly. +S4. Carotid upstroke normal, bilateral carotid bruits versus radiated from the heart.  Pedals normal pulses. No edema, no varicosities. Abdomen:  Bowel sounds positive; abdomen soft and non-tender without masses, organomegaly, or hernias noted. No hepatosplenomegaly. Extremities:  No clubbing or cyanosis. Neurologic:  Alert and oriented x 3. Psych:  Normal affect.

## 2011-05-05 NOTE — Assessment & Plan Note (Signed)
LDL at goal (< 70).  Continue statin.

## 2011-05-05 NOTE — Assessment & Plan Note (Signed)
Bicuspid aortic valve with minimal stenosis and no associated thoracic aortic aneurysm.  Will need to monitor valve probably every other year unless stenosis appears progressive.

## 2011-09-29 LAB — BASIC METABOLIC PANEL
Calcium: 10.1
Chloride: 104
Creatinine, Ser: 1.18
GFR calc Af Amer: >60
GFR calc non Af Amer: >60

## 2011-09-29 LAB — DIFFERENTIAL
Lymphs Abs: 1.7
Monocytes Relative: 11
Neutro Abs: 3.9
Neutrophils Relative %: 61

## 2011-09-29 LAB — CBC
RBC: 4.34
WBC: 6.4

## 2011-10-15 ENCOUNTER — Telehealth: Payer: Self-pay | Admitting: Cardiology

## 2011-10-15 NOTE — Telephone Encounter (Signed)
Travis Armstrong is calling complaining of frequent urination x a couple months and bil back pain (mid-lower) x 2-3 weeks.  He is also complaining of concentrated urine but admits to not drinking a lot.  Denies pain with voiding, urgency or hematuria.  He states the back pain gets worse with lifting.  I recommended he contact his pcp or go to urgent care to be evaluated.

## 2011-10-15 NOTE — Telephone Encounter (Signed)
New message: He has a question about some of the medication he is on.  May be having kidney problems. Please call him regarding same.

## 2011-11-02 ENCOUNTER — Ambulatory Visit (INDEPENDENT_AMBULATORY_CARE_PROVIDER_SITE_OTHER): Payer: Medicare Other | Admitting: Cardiology

## 2011-11-02 ENCOUNTER — Encounter: Payer: Self-pay | Admitting: Cardiology

## 2011-11-02 ENCOUNTER — Telehealth: Payer: Self-pay | Admitting: Cardiology

## 2011-11-02 DIAGNOSIS — R079 Chest pain, unspecified: Secondary | ICD-10-CM

## 2011-11-02 DIAGNOSIS — I2510 Atherosclerotic heart disease of native coronary artery without angina pectoris: Secondary | ICD-10-CM

## 2011-11-02 DIAGNOSIS — E785 Hyperlipidemia, unspecified: Secondary | ICD-10-CM

## 2011-11-02 MED ORDER — RANOLAZINE ER 500 MG PO TB12: 500.0000 mg | ORAL_TABLET | Freq: Two times a day (BID) | ORAL | Status: DC

## 2011-11-02 NOTE — Patient Instructions (Addendum)
Start Ranexa (ranolazine) 500mg  twice a day. You have the prescription.  Your physician has requested that you have en exercise stress myoview. For further information please visit [email or web address] Please follow instruction sheet, as given.    Your physician recommends that you schedule a follow-up appointment in: 2 weeks with Dr Shirlee Latch.

## 2011-11-02 NOTE — Telephone Encounter (Signed)
I talked with pt. Pt states Ranexa 500mg  bid at [facility] with co-pay card was going to be $165.00 out of pocket. Pt is requesting a prescription for Ranexa 500mg  bid for  30 days with refills  be faxed to Dr Alphonsa Gin at the [facility] [phone]. I will fax this in the morning along with Dr Alford Highland note from today.

## 2011-11-02 NOTE — Assessment & Plan Note (Addendum)
Coronary disease with 90% calcified mid LAD stenosis on prior cath.  The vessel was small caliber at this point.  Given the small-sized vessel and stable symptoms, I planned to manage him medically initially.  After starting metoprolol, exertional chest pain resolved.  He has been having chest pain again for the last two weeks.  This time, the chest pain is somewhat atypical (not exertional).  I am going to have him start ranolazine 500 mg bid (he does not want to take Imdur as he occasionally uses Viagra).  He will continue ASA 81, lisinopril, metoprolol, and statin.  Will get ETT-myoview to see if there are any new findings that would be suggestive of progressive disease.  His LAD lesion is not a great PCI target; however, PCI would be an option but with higher risk of need for revascularization down the road given the small caliber vessel.  Followup in 2 weeks.

## 2011-11-02 NOTE — Assessment & Plan Note (Signed)
Goal LDL < 70.  Had lipids done recently at [facility].  I will try to obtain a copy from the [place].

## 2011-11-02 NOTE — Telephone Encounter (Signed)
New message: pt called and was to give info regarding his visit this am. Please call him for this information.

## 2011-11-02 NOTE — Progress Notes (Signed)
PCP: Dr. Andrey Campanile  70 yo with history of HTN presented initially for evaluation of chest pain in 2011.  It occurred with splitting wood and after walking about 1/4 mile on flat ground.  Given these exertional symptoms, I set him up for an ETT-myoview. This showed evidence for ischemia by exercise ECG, and there was a small reversible apical perfusion defect possibly suggestive of ischemia as well.  Echo to assess for cause of systolic murmur showed a bicuspid aortic valve with preserved LV systolic function and no significant aortic stenosis.  I set him up for a left heart cath in 1/12, which showed heavy calcification in the LAD.  There was a long 90% mid LAD stenosis.  The LAD was small at this point, a 2-2.5 mm vessel.  After discussion with Dr. Clifton James, I decided to proceed with medical treatment as his symptoms were only with moderate exertion and as the LAD was a small caliber vessel at the site of the stenosis.  Given the bicuspid aortic valve, I did an Travis angiogram of the thoracic aorta, which ruied out aortic aneurysm.    Travis Armstrong initially did very well on low dose metoprolol with no further chest discomfort.  He is quite active, playing golf and walking for exercise.  However, for the last two weeks, he has noted episodes of mild substernal chest tightness.  This time, it does not seem to be exertional.  About a week ago, he had very mild chest pain that lasted for several hours.  However, the pain typically lasts a few minutes then resolves.  The chest pain seems to be able to happen at any time.  It is not related to meals. He has played golf and bowled in the last 2 weeks with no exertional chest pain.   BP is under good control.  Patient is taking all his meds.   Labs (10/11): LDL 113, HDL 46, K 5.1, creatinine 1.1, LFTs normal Labs (1/12): K 5.3, creatinine 1.1 Labs (3/12): LDL 51, HDL 37  ECG: NSR at 53, QTc normal  Allergies (verified):  1)  ! Pcn  Past Medical History: 1. Actinic  Keratosis 2. Muscle strains  3. HTN 4. CAD: Anginal-type chest pain.  ETT-myoview (1/12) showed good exercise tolerance but also inferior and V5/V6 1 mm ST depression and chest pain with exercise.  Perfusion images showed reversible apical perfusion defect.  Findings suggested ischemia.  LHC (1/12): LAD heavily calcified with 90% calcified mid LAD stenosis after D2.  The LAD was small caliber (2-2.5 mm) at this point.  Because of this and his lack of unstable symptoms, I elected to manage him medically initially.  5. Bicuspid aortic valve: Echo (1/12) showed EF 60-65%, bicuspid aortic valve with minimal stenosis (mean gradient 9 mmHg), grade II diastolic dysfunction, mild Travis, mild LAE.  Travis angiogram chest: No thoracic aortic aneurysm.   6. Carotid dopplers (1/12): No significant stenosis.  7.  Hyperlipidemia: Myalgias with Lipitor.  8.  Mild bradycardia  Family History: 2 brothers with CAD diagnosed in their 20s.  Sister with diabetes, died of complications from this disease.   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   Current Outpatient Prescriptions  Medication Sig Dispense Refill  . aspirin 81 MG tablet Take 81 mg by mouth daily.        Marland Kitchen  ciprofloxacin (CIPRO) 500 MG tablet Take 500 mg by mouth as directed.        . fluticasone (FLONASE) 50 MCG/ACT nasal spray Place 2 sprays into the nose daily.        Marland Kitchen lisinopril (PRINIVIL,ZESTRIL) 10 MG tablet Take 10 mg by mouth daily.        Marland Kitchen loratadine (CLARITIN) 10 MG tablet Take 10 mg by mouth daily.        . magnesium gluconate (MAGONATE) 500 MG tablet Take 500 mg by mouth daily.        . metoprolol tartrate (LOPRESSOR) 25 MG tablet 1/2 tab po bid       . Multiple Vitamins-Minerals (OCUVITE PO) 2 tabs po qd       . Omega-3 Fatty Acids (FISH OIL) 1000 MG CAPS 1 tab po qd         . pravastatin (PRAVACHOL) 40 MG tablet 2 tabs po qd       . ranolazine (RANEXA) 500 MG 12 hr tablet Take 1 tablet (500 mg total) by mouth 2 (two) times daily.  60 tablet  6    BP 126/64  Pulse 53  Ht 5\' 11"  (1.803 m)  Wt 87.544 kg (193 lb)  BMI 26.92 kg/m2 General:  Well developed, well nourished, in no acute distress. Neck:  Neck supple, no JVD. No masses, thyromegaly or abnormal cervical nodes. Lungs:  Clear bilaterally to auscultation and percussion. Heart:  Non-displaced PMI, chest non-tender; regular rate and rhythm, S1, S2.  2/6 systolic crescendo-decrescendo murmur RUSB.  S2 heard clearly. +S4. Carotid upstroke normal, bilateral carotid bruits versus radiated from the heart.  Pedals normal pulses. No edema, no varicosities. Abdomen:  Bowel sounds positive; abdomen soft and non-tender without masses, organomegaly, or hernias noted. No hepatosplenomegaly. Extremities:  No clubbing or cyanosis. Neurologic:  Alert and oriented x 3. Psych:  Normal affect.

## 2011-11-03 MED ORDER — RANOLAZINE ER 500 MG PO TB12: 500.0000 mg | ORAL_TABLET | Freq: Two times a day (BID) | ORAL | Status: DC

## 2011-11-03 NOTE — Telephone Encounter (Signed)
Pt is aware that I faxed prescription for Ranexa to the [place].

## 2011-11-03 NOTE — Telephone Encounter (Signed)
[facility] (follow up from previous call): Returning call to [facility]. Please call back.

## 2011-11-05 ENCOUNTER — Encounter (HOSPITAL_COMMUNITY): Payer: Medicare Other | Admitting: Radiology

## 2011-11-08 ENCOUNTER — Telehealth: Payer: Self-pay | Admitting: Cardiology

## 2011-11-08 NOTE — Telephone Encounter (Signed)
Have him start Imdur 30 mg daily instead.  He needs to hold off on Imdur x 24 hours if he plans to use Viagra.

## 2011-11-08 NOTE — Telephone Encounter (Signed)
Pt calling wanting to speak with nurse regarding pt medicaiton. Please return pt call to discuss further.

## 2011-11-08 NOTE — Telephone Encounter (Signed)
LMTCB

## 2011-11-08 NOTE — Telephone Encounter (Signed)
I talked with pt. Pt states he is unable to get Ranexa from the [place]. [facility] is [facility] (he thinks this is right)  instead of Ranexa. I will forward to Dr Shirlee Latch for review.

## 2011-11-09 NOTE — Telephone Encounter (Signed)
Pt rtning your call from yesterday °

## 2011-11-09 NOTE — Telephone Encounter (Signed)
I talked with pt. Pt states he feels his chest pain is better and he declined Imdur right now. Pt will have myoview 11/10/11 and will see Dr Shirlee Latch in follow-up 11/19/11. Pt will call if any changes in his symptoms. I will forward to Dr Shirlee Latch for review.

## 2011-11-10 ENCOUNTER — Ambulatory Visit (HOSPITAL_COMMUNITY): Payer: Medicare Other | Attending: Cardiology | Admitting: Radiology

## 2011-11-10 DIAGNOSIS — I10 Essential (primary) hypertension: Secondary | ICD-10-CM | POA: Insufficient documentation

## 2011-11-10 DIAGNOSIS — R5383 Other fatigue: Secondary | ICD-10-CM | POA: Insufficient documentation

## 2011-11-10 DIAGNOSIS — Z8249 Family history of ischemic heart disease and other diseases of the circulatory system: Secondary | ICD-10-CM | POA: Insufficient documentation

## 2011-11-10 DIAGNOSIS — R079 Chest pain, unspecified: Secondary | ICD-10-CM

## 2011-11-10 DIAGNOSIS — R5381 Other malaise: Secondary | ICD-10-CM | POA: Insufficient documentation

## 2011-11-10 DIAGNOSIS — E785 Hyperlipidemia, unspecified: Secondary | ICD-10-CM | POA: Insufficient documentation

## 2011-11-10 DIAGNOSIS — I2510 Atherosclerotic heart disease of native coronary artery without angina pectoris: Secondary | ICD-10-CM

## 2011-11-10 DIAGNOSIS — R0789 Other chest pain: Secondary | ICD-10-CM | POA: Insufficient documentation

## 2011-11-10 DIAGNOSIS — Z87891 Personal history of nicotine dependence: Secondary | ICD-10-CM | POA: Insufficient documentation

## 2011-11-10 MED ORDER — TECHNETIUM TC 99M TETROFOSMIN IV KIT
33.0000 | PACK | Freq: Once | INTRAVENOUS | Status: AC | PRN
  Administered 2011-11-10: 33 via INTRAVENOUS

## 2011-11-10 MED ORDER — TECHNETIUM TC 99M TETROFOSMIN IV KIT
11.0000 | PACK | Freq: Once | INTRAVENOUS | Status: AC | PRN
  Administered 2011-11-10: 11 via INTRAVENOUS

## 2011-11-10 NOTE — Progress Notes (Addendum)
[facility] SITE 3 NUCLEAR MED [address] [place] [address] [phone]  Cardiology Nuclear Med Thor Nannini Harville is a 70 y.o. male [id number] 1941/05/08   Nuclear Med Background Indication for Stress Test:  Evaluation for Ischemia History: 1/12  Echo:EF=60-65%, 01/05/11 Myocardial Perfusion Study: Apical ischemia, EF=62%> Cath: LAD mid 90%, residual N/O CAD, treat medically Cardiac Risk Factors: Family History - CAD, History of Smoking, Hypertension and Lipids  Symptoms:  Chest Tightness with and without  Exertion (last date of chest discomfort 2 days ago) and Fatigue with Exertion   Nuclear Pre-Procedure Caffeine/Decaff Intake:  None NPO After: 7:00pm   Lungs: Clear IV 0.9% NS with Angio Cath:  20g  IV Site: R Antecubital  IV Started by:  Milana Na, EMT-P  Chest Size (in):  44 Cup Size: n/a  Height: 6\' 1"  (1.854 m)  Weight:  191 lb (86.637 kg)  BMI:  Body mass index is 25.20 kg/(m^2). Tech Comments:  Held Lopressor x 24 hrs    Nuclear Med Study 1 or 2 day study: 1 day  Stress Test Type:  Stress  Reading MD: Cassell Clement, MD  Order Authorizing Provider:  [name], MD  Resting Radionuclide: Technetium 64m Tetrofosmin  Resting Radionuclide Dose: 11.0 mCi   Stress Radionuclide:  Technetium 38m Tetrofosmin  Stress Radionuclide Dose: 33.0 mCi           Stress Protocol Rest HR: 49 Stress HR: 137  Rest BP: 123/62 Stress BP: 176/72  Exercise Time (min): 9:30 METS: 10.9   Predicted Max HR: 150 bpm % Max HR: 91.33 bpm Rate Pressure Product: 21308   Dose of Adenosine (mg):  n/a Dose of Lexiscan: n/a mg  Dose of Atropine (mg): n/a Dose of Dobutamine: n/a mcg/kg/min (at max HR)  Stress Test Technologist: Irean Hong, RN  Nuclear Technologist:  Domenic Polite, CNMT     Rest Procedure:  Myocardial perfusion imaging was performed at rest 45 minutes following the intravenous administration of Technetium 80m Tetrofosmin. Rest ECG:  Marked Sinus Bradycardia  Stress Procedure:  The patient exercised for 9 minutes and 30 seconds, RPE=15.  The patient stopped due to Fatigue and complained of  chest tightness 2-3/10.  There were significant ST-T wave changes. There were occasional PVC's, PAC's and 3 beat PSVT. Technetium 25m Tetrofosmin was injected at peak exercise and myocardial perfusion imaging was performed after a brief delay. Stress ECG: Significant ST abnormalities consistent with ischemia.  QPS Raw Data Images:  Normal; no motion artifact; normal heart/lung ratio. Stress Images:  There is decreased uptake in the apex. Rest Images:  Normal homogeneous uptake in all areas of the myocardium. Subtraction (SDS):  These findings are consistent with ischemia. Transient Ischemic Dilatation (Normal <1.22):  0.99 Lung/Heart Ratio (Normal <0.45):  0.30  Quantitative Gated Spect Images QGS EDV:  100 ml QGS ESV:  32 ml QGS cine images:  Apical hypokinesis QGS EF: 67%  Impression Exercise Capacity:  Good exercise capacity. BP Response:  Normal blood pressure response. Clinical Symptoms:  Mild chest pain/dyspnea. ECG Impression:  Significant ST abnormalities consistent with ischemia. Comparison with Prior Nuclear Study: No significant change from previous study  Overall Impression:  Abnormal stress nuclear study.  Patient has reversible apical ischemia associated with EKG changes.  Images are similar to prior study of 01/05/11.  Both studies also show small amount of reversible inferior wall ischemia. EF normal.  Cassell Clement     Similar to prior nuclear study (no change).  If still having chest pain after starting Imdur, will cath.  Plan to see next week.  [place] [employer]

## 2011-11-12 MED ORDER — ISOSORBIDE MONONITRATE ER 30 MG PO TB24: 30.0000 mg | ORAL_TABLET | Freq: Every day | ORAL | Status: DC

## 2011-11-12 NOTE — Telephone Encounter (Signed)
F/u:  Pt called and stated that he would like to discuss the  Medication that was suggested.

## 2011-11-12 NOTE — Telephone Encounter (Signed)
Pt requesting prescription for imdur now. He states he hasn't felt just right after the [facility] 11/10/11. He has an occ tightness in his chest that is relieved with rubbing the area on his chest that hurts. Pt states he was not symptomatic during the myoview and he started feeling this way about the middle of the day yesterday. He also states he has not had to limit his physical activity because of this.Pt denies any other symptoms. I reviewed with Dr Shirlee Latch. Pt will start imdur 30mg  daily. Pt is aware he cannot use viagra  within 24 hours of taking imdur. He has an appt to see Dr Shirlee Latch 11/19/11.

## 2011-11-15 NOTE — Progress Notes (Signed)
I talked with pt 11/12/11. Prescription for Imdur to pharmacy. Pt has an appt with Dr Shirlee Latch 11/19/11

## 2011-11-16 ENCOUNTER — Telehealth: Payer: Self-pay | Admitting: Cardiology

## 2011-11-16 NOTE — Telephone Encounter (Signed)
FU Call: Pt returning call to [facility]. Please return pt call to discuss further.

## 2011-11-16 NOTE — Telephone Encounter (Signed)
I talked with pt about recent myoview. Pt states he has started Imdur. He states his chest feels OK.

## 2011-11-19 ENCOUNTER — Ambulatory Visit (INDEPENDENT_AMBULATORY_CARE_PROVIDER_SITE_OTHER): Payer: Medicare Other | Admitting: Cardiology

## 2011-11-19 ENCOUNTER — Encounter: Payer: Self-pay | Admitting: *Deleted

## 2011-11-19 ENCOUNTER — Encounter: Payer: Self-pay | Admitting: Cardiology

## 2011-11-19 DIAGNOSIS — R079 Chest pain, unspecified: Secondary | ICD-10-CM

## 2011-11-19 DIAGNOSIS — I2510 Atherosclerotic heart disease of native coronary artery without angina pectoris: Secondary | ICD-10-CM

## 2011-11-19 DIAGNOSIS — I359 Nonrheumatic aortic valve disorder, unspecified: Secondary | ICD-10-CM

## 2011-11-19 DIAGNOSIS — E785 Hyperlipidemia, unspecified: Secondary | ICD-10-CM

## 2011-11-19 LAB — CBC WITH DIFFERENTIAL/PLATELET
Eosinophils Relative: 2.2 % (ref 0.0–5.0)
HCT: 39.7 % (ref 39.0–52.0)
Hemoglobin: 13.4 g/dL (ref 13.0–17.0)
Lymphs Abs: 1.3 10*3/uL (ref 0.7–4.0)
MCV: 96.2 fl (ref 78.0–100.0)
Monocytes Absolute: 0.7 10*3/uL (ref 0.1–1.0)
Neutro Abs: 4.3 10*3/uL (ref 1.4–7.7)
Platelets: 168 10*3/uL (ref 150.0–400.0)
RDW: 13.6 % (ref 11.5–14.6)
WBC: 6.5 10*3/uL (ref 4.5–10.5)

## 2011-11-19 LAB — BASIC METABOLIC PANEL
BUN: 12 mg/dL (ref 6–23)
Chloride: 106 mEq/L (ref 96–112)
Glucose, Bld: 113 mg/dL — ABNORMAL HIGH (ref 70–99)
Potassium: 4.2 mEq/L (ref 3.5–5.1)
Sodium: 139 mEq/L (ref 135–145)

## 2011-11-19 LAB — PROTIME-INR: Prothrombin Time: 11 s (ref 10.2–12.4)

## 2011-11-19 MED ORDER — SODIUM CHLORIDE 0.9 % IJ SOLN: 3.0000 mL | INTRAMUSCULAR | Status: DC | PRN

## 2011-11-19 MED ORDER — SODIUM CHLORIDE 0.9 % IV SOLN: 250.0000 mL | INTRAVENOUS | Status: DC | PRN

## 2011-11-19 MED ORDER — AMLODIPINE BESYLATE 2.5 MG PO TABS: 2.5000 mg | ORAL_TABLET | Freq: Every day | ORAL | Status: DC

## 2011-11-19 MED ORDER — SODIUM CHLORIDE 0.9 % IJ SOLN: 3.0000 mL | Freq: Two times a day (BID) | INTRAMUSCULAR | Status: DC

## 2011-11-19 NOTE — Assessment & Plan Note (Signed)
Coronary disease with 90% calcified mid LAD stenosis on prior cath.  The vessel was small caliber.  Given the small-sized vessel and stable symptoms, I planned to manage him medically initially.  After starting metoprolol, exertional chest pain initially resolved.  However, over the last few weeks, he has redeveloped mild exertional and nonexertional chest pain that occurs on most days.  ETT-myoview is similar to the prior study in 1/12 with ischemic ECG and a small area of apical ischemia on perfusion images.  He was unable to tolerate Imdur and ranolazine was too expensive.  Given the worsening symptoms, I think we need to go ahead and repeat a coronary angiogram.  If he has progressive disease, would re-explore PCI, but given the small caliber of the vessel (with increased risk of developing problems with the stent), LIMA-LAD may be a better option for long-term durability and symptom control.   - LHC early next week - start amlodipine 2.5 mg daily for angina - Continue ASA, statin, metoprolol, ACEI.  Have not been able to increase metoprolol farther due to bradycardia.

## 2011-11-19 NOTE — Assessment & Plan Note (Signed)
Goal LDL < 70.  Had lipids done recently at [facility].  Still trying to obtain copy from [place].

## 2011-11-19 NOTE — Progress Notes (Signed)
PCP: Dr. Wilson  70 yo with history of HTN presented initially for evaluation of chest pain in 2011.  It occurred with splitting wood and after walking about 1/4 mile on flat ground.  Given these exertional symptoms, I set him up for an ETT-myoview. This showed evidence for ischemia by exercise ECG, and there was a small reversible apical perfusion defect possibly suggestive of ischemia as well.  Echo to assess for cause of systolic murmur showed a bicuspid aortic valve with preserved LV systolic function and no significant aortic stenosis.  I set him up for a left heart cath in 1/12, which showed heavy calcification in the LAD.  There was a long 90% mid LAD stenosis.  The LAD was small at this point, a 2-2.5 mm vessel.  I decided to proceed with medical treatment as his symptoms were only with moderate exertion and as the LAD was a small caliber vessel at the site of the stenosis.  Given the bicuspid aortic valve, I did an Travis angiogram of the thoracic aorta, which ruied out aortic aneurysm.    Travis Armstrong initially did very well on low dose metoprolol with no further chest discomfort.  He is quite active, playing golf and walking for exercise.  However, for the last several weeks, he has noted episodes of mild substernal chest tightness.  Sometimes this is with exertion and sometimes it is at rest.  It is happening every day and is clearly worse.  It is not definitively triggered by a certain amount of exertion: he has been bowling and playing golf without chest discomfort, but has noted some chest discomfort with walking on occasion. He is fairly stoic and per his wife may be minimizing his symptoms.  I initially wanted him to try ranolazine, but it was too expensive. Imdur gave him a bad headache.  I had him do an ETT-myoview: he had good exercise tolerance but developed mild chest discomfort.  There were significant ECG changes.  There was a small area of apical ischemia on myoview images that was similar to the  prior myoview done before his cath.   Labs (10/11): LDL 113, HDL 46, K 5.1, creatinine 1.1, LFTs normal Labs (1/12): K 5.3, creatinine 1.1 Labs (3/12): LDL 51, HDL 37  ECG: NSR at 53, QTc normal  Allergies (verified):  1)  ! Pcn  Past Medical History: 1. Actinic Keratosis 2. Muscle strains  3. HTN 4. CAD: Anginal-type chest pain.  ETT-myoview (1/12) showed good exercise tolerance but also inferior and V5/V6 1 mm ST depression and chest pain with exercise.  Perfusion images showed reversible apical perfusion defect.  Findings suggested ischemia.  LHC (1/12): LAD heavily calcified with 90% calcified mid LAD stenosis after D2.  The LAD was small caliber (2-2.5 mm) at this point.  Because of this and his lack of unstable symptoms, I elected to manage him medically initially.  ETT-myoview (11/12): 9'30" exercise with mild chest pain and ischemic ST changes.  There was a small reversible apical perfusion defect, similar to the 1/12 study.  5. Bicuspid aortic valve: Echo (1/12) showed EF 60-65%, bicuspid aortic valve with minimal stenosis (mean gradient 9 mmHg), grade II diastolic dysfunction, mild Travis, mild LAE.  Travis angiogram chest: No thoracic aortic aneurysm.   6. Carotid dopplers (1/12): No significant stenosis.  7.  Hyperlipidemia: Myalgias with Lipitor.  8.  Mild bradycardia  Family History: 2 brothers with CAD diagnosed in their 60s.  Sister with diabetes, died of complications from this   disease.   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   Current Outpatient Prescriptions  Medication Sig Dispense Refill  . aspirin 81 MG tablet Take 81 mg by mouth daily.        . ciprofloxacin (CIPRO) 500 MG tablet Take 500 mg by mouth as directed.        . fluticasone (FLONASE) 50 MCG/ACT nasal spray Place 2 sprays into the nose  daily.        . lisinopril (PRINIVIL,ZESTRIL) 10 MG tablet Take 10 mg by mouth daily.        . loratadine (CLARITIN) 10 MG tablet Take 10 mg by mouth daily.        . magnesium gluconate (MAGONATE) 500 MG tablet Take 500 mg by mouth daily.        . metoprolol tartrate (LOPRESSOR) 25 MG tablet 1/2 tab po bid       . Multiple Vitamins-Minerals (OCUVITE PO) 2 tabs po qd       . Omega-3 Fatty Acids (FISH OIL) 1000 MG CAPS 1 tab po qd       . pravastatin (PRAVACHOL) 40 MG tablet 2 tabs po qd       . amLODipine (NORVASC) 2.5 MG tablet Take 1 tablet (2.5 mg total) by mouth daily.  30 tablet  6   Current Facility-Administered Medications  Medication Dose Route Frequency Provider Last Rate Last Dose  . 0.9 %  sodium chloride infusion  250 mL Intravenous PRN Shanai Lartigue, MD      . sodium chloride 0.9 % injection 3 mL  3 mL Intravenous Q12H Matraca Hunkins, MD      . sodium chloride 0.9 % injection 3 mL  3 mL Intravenous PRN Lessly Stigler, MD        BP 144/64  Pulse 64  Ht 5' 11" (1.803 m)  Wt 88.451 kg (195 lb)  BMI 27.20 kg/m2 General:  Well developed, well nourished, in no acute distress. Neck:  Neck supple, no JVD. No masses, thyromegaly or abnormal cervical nodes. Lungs:  Clear bilaterally to auscultation and percussion. Heart:  Non-displaced PMI, chest non-tender; regular rate and rhythm, S1, S2.  2/6 early systolic crescendo-decrescendo murmur RUSB.  S2 heard clearly. +S4. Carotid upstroke normal, bilateral carotid bruits versus radiated from the heart.  Pedals normal pulses. No edema, no varicosities. Abdomen:  Bowel sounds positive; abdomen soft and non-tender without masses, organomegaly, or hernias noted. No hepatosplenomegaly. Extremities:  No clubbing or cyanosis. Neurologic:  Alert and oriented x 3. Psych:  Normal affect. 

## 2011-11-19 NOTE — Assessment & Plan Note (Signed)
Bicuspid aortic valve with minimal stenosis and no associated thoracic aortic aneurysm.  Will need to monitor valve probably every other year unless stenosis appears progressive.

## 2011-11-19 NOTE — Patient Instructions (Signed)
Start amlodipine 2.5mg  daily.  Lab today--BMET/CBC/PT 786.50  414.01  Your physician has requested that you have a cardiac catheterization. Cardiac catheterization is used to diagnose and/or treat various heart conditions. Doctors may recommend this procedure for a number of different reasons. The most common reason is to evaluate chest pain. Chest pain can be a symptom of coronary artery disease (CAD), and cardiac catheterization can show whether plaque is narrowing or blocking your heart's arteries. This procedure is also used to evaluate the valves, as well as measure the blood flow and oxygen levels in different parts of your heart. For further information please visit [email or web address] Please follow instruction sheet, as given. Tuesday December 4,2012  Your physician recommends that you schedule a follow-up appointment in: 3 weeks with Dr Shirlee Latch.

## 2011-11-23 ENCOUNTER — Encounter (HOSPITAL_COMMUNITY): Payer: Self-pay | Admitting: General Practice

## 2011-11-23 ENCOUNTER — Encounter (HOSPITAL_BASED_OUTPATIENT_CLINIC_OR_DEPARTMENT_OTHER)
Admission: RE | Disposition: A | Discharge status: Hospice | Payer: Self-pay | Source: Ambulatory Visit | Attending: Cardiology

## 2011-11-23 ENCOUNTER — Inpatient Hospital Stay (HOSPITAL_BASED_OUTPATIENT_CLINIC_OR_DEPARTMENT_OTHER)
Admission: RE | Admit: 2011-11-23 | Discharge: 2011-11-23 | Disposition: A | Discharge status: Hospice | Payer: Medicare Other | Source: Ambulatory Visit | Attending: Cardiology | Admitting: Cardiology

## 2011-11-23 ENCOUNTER — Other Ambulatory Visit: Payer: Self-pay

## 2011-11-23 ENCOUNTER — Ambulatory Visit (HOSPITAL_COMMUNITY)
Admission: RE | Admit: 2011-11-23 | Discharge: 2011-11-24 | Disposition: A | Discharge status: Home / Self Care | Payer: Medicare Other | Source: Other Acute Inpatient Hospital | Attending: Cardiology | Admitting: Cardiology

## 2011-11-23 ENCOUNTER — Encounter (HOSPITAL_COMMUNITY)
Admission: RE | Disposition: A | Discharge status: Home / Self Care | Payer: Self-pay | Source: Other Acute Inpatient Hospital | Attending: Cardiology

## 2011-11-23 DIAGNOSIS — I209 Angina pectoris, unspecified: Secondary | ICD-10-CM | POA: Insufficient documentation

## 2011-11-23 DIAGNOSIS — I200 Unstable angina: Secondary | ICD-10-CM | POA: Insufficient documentation

## 2011-11-23 DIAGNOSIS — I2510 Atherosclerotic heart disease of native coronary artery without angina pectoris: Secondary | ICD-10-CM | POA: Insufficient documentation

## 2011-11-23 DIAGNOSIS — R079 Chest pain, unspecified: Secondary | ICD-10-CM

## 2011-11-23 DIAGNOSIS — Z01810 Encounter for preprocedural cardiovascular examination: Secondary | ICD-10-CM | POA: Insufficient documentation

## 2011-11-23 HISTORY — DX: Bradycardia, unspecified: R00.1

## 2011-11-23 HISTORY — PX: CORONARY ANGIOPLASTY WITH STENT PLACEMENT: SHX49

## 2011-11-23 HISTORY — PX: PERCUTANEOUS CORONARY STENT INTERVENTION (PCI-S): SHX5485

## 2011-11-23 SURGERY — PERCUTANEOUS CORONARY STENT INTERVENTION (PCI-S)
Anesthesia: LOCAL

## 2011-11-23 SURGERY — JV LEFT HEART CATHETERIZATION WITH CORONARY ANGIOGRAM
Anesthesia: Moderate Sedation

## 2011-11-23 SURGERY — JV CORONARY ANGIOGRAM
Anesthesia: Moderate Sedation | Laterality: Bilateral

## 2011-11-23 MED ORDER — CLOPIDOGREL BISULFATE 75 MG PO TABS
75.0000 mg | ORAL_TABLET | Freq: Every day | ORAL | Status: DC
  Administered 2011-11-24: 75 mg via ORAL
  Filled 2011-11-23: qty 1

## 2011-11-23 MED ORDER — OMEGA-3-ACID ETHYL ESTERS 1 G PO CAPS
1.0000 g | ORAL_CAPSULE | Freq: Every day | ORAL | Status: DC
  Administered 2011-11-24: 1 g via ORAL
  Filled 2011-11-23 (×2): qty 1

## 2011-11-23 MED ORDER — FLUTICASONE PROPIONATE 50 MCG/ACT NA SUSP
2.0000 | Freq: Every day | NASAL | Status: DC
  Administered 2011-11-24: 2 via NASAL
  Filled 2011-11-23: qty 16

## 2011-11-23 MED ORDER — FENTANYL CITRATE 0.05 MG/ML IJ SOLN
INTRAMUSCULAR | Status: AC
Start: 2011-11-23 — End: 2011-11-23
  Filled 2011-11-23: qty 2

## 2011-11-23 MED ORDER — AMLODIPINE BESYLATE 2.5 MG PO TABS
2.5000 mg | ORAL_TABLET | Freq: Every day | ORAL | Status: DC
  Administered 2011-11-24: 2.5 mg via ORAL
  Filled 2011-11-23 (×2): qty 1

## 2011-11-23 MED ORDER — ASPIRIN 81 MG PO TABS: 81.0000 mg | ORAL_TABLET | Freq: Every day | ORAL | Status: DC

## 2011-11-23 MED ORDER — NITROGLYCERIN 0.2 MG/ML ON CALL CATH LAB
INTRAVENOUS | Status: AC
  Filled 2011-11-23: qty 1

## 2011-11-23 MED ORDER — TICAGRELOR 90 MG PO TABS
180.0000 mg | ORAL_TABLET | Freq: Once | ORAL | Status: AC
  Administered 2011-11-23: 180 mg via ORAL
  Filled 2011-11-23: qty 2

## 2011-11-23 MED ORDER — ASPIRIN EC 81 MG PO TBEC
81.0000 mg | DELAYED_RELEASE_TABLET | Freq: Every day | ORAL | Status: DC
  Filled 2011-11-23: qty 1

## 2011-11-23 MED ORDER — ONDANSETRON HCL 4 MG/2ML IJ SOLN: 4.0000 mg | Freq: Four times a day (QID) | INTRAMUSCULAR | Status: DC | PRN

## 2011-11-23 MED ORDER — MAGNESIUM OXIDE 400 MG PO TABS
400.0000 mg | ORAL_TABLET | Freq: Every day | ORAL | Status: DC
  Administered 2011-11-24 (×2): 400 mg via ORAL
  Filled 2011-11-23 (×2): qty 1

## 2011-11-23 MED ORDER — SODIUM CHLORIDE 0.9 % IV SOLN
INTRAVENOUS | Status: AC
  Administered 2011-11-23: 15:00:00 via INTRAVENOUS

## 2011-11-23 MED ORDER — OCUVITE PO TABS
1.0000 | ORAL_TABLET | Freq: Every day | ORAL | Status: DC
  Administered 2011-11-24: 1 via ORAL
  Filled 2011-11-23 (×2): qty 1

## 2011-11-23 MED ORDER — LIDOCAINE HCL (PF) 1 % IJ SOLN
INTRAMUSCULAR | Status: AC
  Filled 2011-11-23: qty 30

## 2011-11-23 MED ORDER — CIPROFLOXACIN HCL 500 MG PO TABS: 500.0000 mg | ORAL_TABLET | Freq: Two times a day (BID) | ORAL | Status: DC

## 2011-11-23 MED ORDER — ASPIRIN 81 MG PO CHEW: 324.0000 mg | CHEWABLE_TABLET | ORAL | Status: DC

## 2011-11-23 MED ORDER — MIDAZOLAM HCL 2 MG/2ML IJ SOLN
INTRAMUSCULAR | Status: AC
  Filled 2011-11-23: qty 2

## 2011-11-23 MED ORDER — HEPARIN (PORCINE) IN NACL 2-0.9 UNIT/ML-% IJ SOLN
INTRAMUSCULAR | Status: AC
  Filled 2011-11-23: qty 2000

## 2011-11-23 MED ORDER — SODIUM CHLORIDE 0.9 % IV SOLN: INTRAVENOUS | Status: DC

## 2011-11-23 MED ORDER — MORPHINE SULFATE 2 MG/ML IJ SOLN: 1.0000 mg | INTRAMUSCULAR | Status: DC | PRN

## 2011-11-23 MED ORDER — LISINOPRIL 10 MG PO TABS
10.0000 mg | ORAL_TABLET | Freq: Every day | ORAL | Status: DC
  Administered 2011-11-24: 10 mg via ORAL
  Filled 2011-11-23 (×2): qty 1

## 2011-11-23 MED ORDER — ACETAMINOPHEN 325 MG PO TABS: 650.0000 mg | ORAL_TABLET | ORAL | Status: DC | PRN

## 2011-11-23 MED ORDER — TICAGRELOR 90 MG PO TABS: 180.0000 mg | ORAL_TABLET | Freq: Once | ORAL | Status: DC

## 2011-11-23 MED ORDER — METOPROLOL TARTRATE 25 MG PO TABS: 25.0000 mg | ORAL_TABLET | Freq: Two times a day (BID) | ORAL | Status: DC

## 2011-11-23 MED ORDER — BIVALIRUDIN 250 MG IV SOLR
INTRAVENOUS | Status: AC
  Filled 2011-11-23: qty 250

## 2011-11-23 MED ORDER — LORATADINE 10 MG PO TABS
10.0000 mg | ORAL_TABLET | Freq: Every day | ORAL | Status: DC
  Filled 2011-11-23: qty 1

## 2011-11-23 MED ORDER — METOPROLOL TARTRATE 12.5 MG HALF TABLET
12.5000 mg | ORAL_TABLET | Freq: Two times a day (BID) | ORAL | Status: DC
  Administered 2011-11-24: 12.5 mg via ORAL
  Filled 2011-11-23 (×4): qty 1

## 2011-11-23 MED ORDER — VERAPAMIL HCL 2.5 MG/ML IV SOLN
INTRAVENOUS | Status: AC
  Filled 2011-11-23: qty 2

## 2011-11-23 MED ORDER — MAGNESIUM GLUCONATE 500 MG PO TABS: 500.0000 mg | ORAL_TABLET | Freq: Every day | ORAL | Status: DC

## 2011-11-23 MED ORDER — ROSUVASTATIN CALCIUM 20 MG PO TABS
20.0000 mg | ORAL_TABLET | Freq: Every day | ORAL | Status: DC
  Filled 2011-11-23 (×2): qty 1

## 2011-11-23 NOTE — Op Note (Signed)
CARDIAC CATH NOTE  Name: Travis Armstrong MRN: [id number] DOB: 06/25/1941  Procedure: PTCA and stenting of the LAD  Indication: 70 year old white male who presents with progressive angina. Diagnostic cardiac catheterization demonstrated high-grade stenosis in the mid LAD of 95% following the second diagonal branch. The LAD was heavily calcified.  Procedural Details: The right wrist was prepped, draped, and anesthetized with 1% lidocaine. A 6 Fr sheath was introduced into the radial artery in exchange for his 5 [place] diagnostic sheath. 3 mg verapamil was administered through the radial sheath. Weight-based bivalirudin was given for anticoagulation. Once a therapeutic ACT was achieved, a 6 [place] XB LAD 3.5 guide catheter was inserted.  A [place] coronary guidewire was used to cross the lesion.  The lesion was predilated with a 2.0 balloon.  However, the lesion could not be crossed with the stent. There was a focal area of stenosis at the takeoff of the second diagonal which was heavily calcified and resistant to dilatation. We attempted to dilate this with a 2.5 x 15 mm [place] Quantum balloon up to 18 atmospheres, a 2.5 x 12 mm [place] track balloon up to 18 atmospheres, and finally got the lesion to yield with a 2.5 x 10 mm Empira balloon at 20 atmospheres. The lesion was then stented with a 2.5 x 28 mm Promus element stent.  The stent was postdilated with a 2.5 mm noncompliant balloon to 14 atmospheres.  Following PCI, there was 0% residual stenosis and TIMI-3 flow. Final angiography confirmed an excellent result. The patient tolerated the procedure well. There were no immediate procedural complications. A TR band was used for radial hemostasis. The patient was transferred to the post catheterization recovery area for further monitoring.  Lesion Data: Vessel: Mid LAD Percent stenosis (pre): 95% TIMI-flow (pre):  3 Stent:  2.5 x 28 mm Promus element Percent stenosis (post): 0% TIMI-flow (post): 3  Conclusions:  Successful intracoronary stenting of the mid LAD with a drug-eluting stent.  Recommendations: Dual antiplatelet therapy for 1 year.  [name] [place] 11/23/2011, 3:05 PM

## 2011-11-23 NOTE — H&P (View-Only) (Signed)
PCP: Dr. Andrey Campanile  70 yo with history of HTN presented initially for evaluation of chest pain in 2011.  It occurred with splitting wood and after walking about 1/4 mile on flat ground.  Given these exertional symptoms, I set him up for an ETT-myoview. This showed evidence for ischemia by exercise ECG, and there was a small reversible apical perfusion defect possibly suggestive of ischemia as well.  Echo to assess for cause of systolic murmur showed a bicuspid aortic valve with preserved LV systolic function and no significant aortic stenosis.  I set him up for a left heart cath in 1/12, which showed heavy calcification in the LAD.  There was a long 90% mid LAD stenosis.  The LAD was small at this point, a 2-2.5 mm vessel.  I decided to proceed with medical treatment as his symptoms were only with moderate exertion and as the LAD was a small caliber vessel at the site of the stenosis.  Given the bicuspid aortic valve, I did an MR angiogram of the thoracic aorta, which ruied out aortic aneurysm.    [name] initially did very well on low dose metoprolol with no further chest discomfort.  He is quite active, playing golf and walking for exercise.  However, for the last several weeks, he has noted episodes of mild substernal chest tightness.  Sometimes this is with exertion and sometimes it is at rest.  It is happening every day and is clearly worse.  It is not definitively triggered by a certain amount of exertion: he has been bowling and playing golf without chest discomfort, but has noted some chest discomfort with walking on occasion. He is fairly stoic and per his wife may be minimizing his symptoms.  I initially wanted him to try ranolazine, but it was too expensive. Imdur gave him a bad headache.  I had him do an ETT-myoview: he had good exercise tolerance but developed mild chest discomfort.  There were significant ECG changes.  There was a small area of apical ischemia on myoview images that was similar to the  prior myoview done before his cath.   Labs (10/11): LDL 113, HDL 46, K 5.1, creatinine 1.1, LFTs normal Labs (1/12): K 5.3, creatinine 1.1 Labs (3/12): LDL 51, HDL 37  ECG: NSR at 53, QTc normal  Allergies (verified):  1)  ! Pcn  Past Medical History: 1. Actinic Keratosis 2. Muscle strains  3. HTN 4. CAD: Anginal-type chest pain.  ETT-myoview (1/12) showed good exercise tolerance but also inferior and V5/V6 1 mm ST depression and chest pain with exercise.  Perfusion images showed reversible apical perfusion defect.  Findings suggested ischemia.  LHC (1/12): LAD heavily calcified with 90% calcified mid LAD stenosis after D2.  The LAD was small caliber (2-2.5 mm) at this point.  Because of this and his lack of unstable symptoms, I elected to manage him medically initially.  ETT-myoview (11/12): 9'30" exercise with mild chest pain and ischemic ST changes.  There was a small reversible apical perfusion defect, similar to the 1/12 study.  5. Bicuspid aortic valve: Echo (1/12) showed EF 60-65%, bicuspid aortic valve with minimal stenosis (mean gradient 9 mmHg), grade II diastolic dysfunction, mild MR, mild LAE.  MR angiogram chest: No thoracic aortic aneurysm.   6. Carotid dopplers (1/12): No significant stenosis.  7.  Hyperlipidemia: Myalgias with Lipitor.  8.  Mild bradycardia  Family History: 2 brothers with CAD diagnosed in their 1s.  Sister with diabetes, died of complications from this  disease.   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   Current Outpatient Prescriptions  Medication Sig Dispense Refill  . aspirin 81 MG tablet Take 81 mg by mouth daily.        . ciprofloxacin (CIPRO) 500 MG tablet Take 500 mg by mouth as directed.        . fluticasone (FLONASE) 50 MCG/ACT nasal spray Place 2 sprays into the nose  daily.        Marland Kitchen lisinopril (PRINIVIL,ZESTRIL) 10 MG tablet Take 10 mg by mouth daily.        Marland Kitchen loratadine (CLARITIN) 10 MG tablet Take 10 mg by mouth daily.        . magnesium gluconate (MAGONATE) 500 MG tablet Take 500 mg by mouth daily.        . metoprolol tartrate (LOPRESSOR) 25 MG tablet 1/2 tab po bid       . Multiple Vitamins-Minerals (OCUVITE PO) 2 tabs po qd       . Omega-3 Fatty Acids (FISH OIL) 1000 MG CAPS 1 tab po qd       . pravastatin (PRAVACHOL) 40 MG tablet 2 tabs po qd       . amLODipine (NORVASC) 2.5 MG tablet Take 1 tablet (2.5 mg total) by mouth daily.  30 tablet  6   Current Facility-Administered Medications  Medication Dose Route Frequency Provider Last Rate Last Dose  . 0.9 %  sodium chloride infusion  250 mL Intravenous PRN [name], MD      . sodium chloride 0.9 % injection 3 mL  3 mL Intravenous Q12H [name], MD      . sodium chloride 0.9 % injection 3 mL  3 mL Intravenous PRN [name], MD        BP 144/64  Pulse 64  Ht 5\' 11"  (1.803 m)  Wt 88.451 kg (195 lb)  BMI 27.20 kg/m2 General:  Well developed, well nourished, in no acute distress. Neck:  Neck supple, no JVD. No masses, thyromegaly or abnormal cervical nodes. Lungs:  Clear bilaterally to auscultation and percussion. Heart:  Non-displaced PMI, chest non-tender; regular rate and rhythm, S1, S2.  2/6 early systolic crescendo-decrescendo murmur RUSB.  S2 heard clearly. +S4. Carotid upstroke normal, bilateral carotid bruits versus radiated from the heart.  Pedals normal pulses. No edema, no varicosities. Abdomen:  Bowel sounds positive; abdomen soft and non-tender without masses, organomegaly, or hernias noted. No hepatosplenomegaly. Extremities:  No clubbing or cyanosis. Neurologic:  Alert and oriented x 3. Psych:  Normal affect.

## 2011-11-23 NOTE — Interval H&P Note (Signed)
History and Physical Interval Note:  11/23/2011 12:14 PM  Travis Armstrong  has presented today for surgery, with the diagnosis of Angina  The various methods of treatment have been discussed with the patient and family. After consideration of risks, benefits and other options for treatment, the patient has consented to  Procedure(s): JV CORONARY ANGIOGRAM as a surgical intervention .  The patients' history has been reviewed, patient examined, no change in status, stable for surgery.  I have reviewed the patients' chart and labs.  Questions were answered to the patient's satisfaction.     Keeven Matty [employer]

## 2011-11-23 NOTE — Progress Notes (Signed)
Transported to main cath lab via stretcher and monitor.  Clothing and family went with patient.

## 2011-11-23 NOTE — Procedures (Signed)
Cardiac Catheterization Procedure Note  Name: NICHOLIS STEPANEK MRN: [id number] DOB: 1941/03/31  Procedure: Left Heart Cath, Selective Coronary Angiography, LV angiography  Indication: Worsening exertional chest pain, known CAD.    Procedural Details: The right wrist was prepped, draped, and anesthetized with 1% lidocaine. Using the modified Seldinger technique, a 5 French sheath was introduced into the right radial artery. 3 mg of verapamil was administered through the sheath, weight-based unfractionated heparin was administered intravenously. Standard Judkins catheters were used for selective coronary angiography and left ventriculography. Catheter exchanges were performed over an exchange length guidewire. There were no immediate procedural complications. A TR band was used for radial hemostasis at the completion of the procedure.  The patient was transferred to the post catheterization recovery area for further monitoring.  Procedural Findings: Hemodynamics: AO 106/50 LV 122/16  Coronary angiography: Coronary dominance: right  Left mainstem: 20% mid vessel stenosis.  Left anterior descending (LAD): Heavy LAD calcification.  50% ostial small D2.  Long diseased segment in the mid LAD reaching 95% stenosis.  The mid to distal LAD appeared small initially but did increase in caliber with intracoronary nitroglycerine.   Left circumflex (LCx): Large ramus with 30% ostial stenosis.  Small AV circumflex with minimal disease.   Right coronary artery (RCA): Luminal irregularities in the RCA with 50% proximal PDA stenosis.   Left ventriculography: Left ventricular systolic function is normal, LVEF is estimated at 55-65%, there is no significant mitral regurgitation   Final Conclusions:  Worsening exertional chest pain with inability to tolerate Imdur.  Coronary angiography shows some progression of the tight mid-LAD lesion.  The mid to distal LAD appeared small on initial imaging but increased in  caliber with intracoronary nitroglycerin.   Recommendations: Plan PCI today to mid LAD.  Discussed with Dr. [place] who will do the procedure.   [name] 11/23/2011, 1:08 PM

## 2011-11-24 ENCOUNTER — Other Ambulatory Visit: Payer: Self-pay

## 2011-11-24 DIAGNOSIS — R079 Chest pain, unspecified: Secondary | ICD-10-CM

## 2011-11-24 DIAGNOSIS — I2510 Atherosclerotic heart disease of native coronary artery without angina pectoris: Secondary | ICD-10-CM

## 2011-11-24 LAB — CBC
HCT: 35.3 % — ABNORMAL LOW (ref 39.0–52.0)
MCHC: 35.1 g/dL (ref 30.0–36.0)
MCV: 90.3 fL (ref 78.0–100.0)
Platelets: 120 10*3/uL — ABNORMAL LOW (ref 150–400)
RDW: 13 % (ref 11.5–15.5)

## 2011-11-24 LAB — BASIC METABOLIC PANEL
BUN: 15 mg/dL (ref 6–23)
Calcium: 8.8 mg/dL (ref 8.4–10.5)
Creatinine, Ser: 0.98 mg/dL (ref 0.50–1.35)
GFR calc Af Amer: >90 mL/min (ref >90)
GFR calc non Af Amer: 81 mL/min — ABNORMAL LOW (ref >90)

## 2011-11-24 MED ORDER — NITROGLYCERIN 0.4 MG SL SUBL: 0.4000 mg | SUBLINGUAL_TABLET | SUBLINGUAL | Status: DC | PRN

## 2011-11-24 MED ORDER — CLOPIDOGREL BISULFATE 75 MG PO TABS: 75.0000 mg | ORAL_TABLET | Freq: Every day | ORAL | Status: DC

## 2011-11-24 MED FILL — Dextrose Inj 5%: INTRAVENOUS | Qty: 50 | Status: AC

## 2011-11-24 NOTE — Progress Notes (Signed)
CARDIAC REHAB PHASE I   PRE:  Rate/Rhythm: 53 SB  BP:  Supine:   Sitting: R 118/96 L 130/57  Standing:    SaO2: 95% RA  MODE:  Ambulation: 340  ft   POST:  Rate/Rhythem: 54 SB  BP:  Supine:   Sitting: L 143/68  Standing:    SaO2: 100% RA 0730 - 0830 Pt ambulated steady with minimal assist, tolerated well. Education done with understanding. Ok for out pt rehab.   Rosalie Doctor

## 2011-11-24 NOTE — Progress Notes (Signed)
Subjective:  No chest pain.  Doing well.  No new symptoms.  Objective:  Vital Signs in the last 24 hours: Temp:  [97.4 F (36.3 C)-98.5 F (36.9 C)] 97.7 F (36.5 C) (12/05 0417) Pulse Rate:  [48-77] 77  (12/05 0417) Resp:  [11-21] 16  (12/05 0417) BP: (114-139)/(44-80) 127/58 mmHg (12/05 0417) SpO2:  [96 %-100 %] 98 % (12/05 0417) Weight:  [86.2 kg (190 lb 0.6 oz)-88 kg (194 lb 0.1 oz)] 190 lb 0.6 oz (86.2 kg) (12/05 0500)  Intake/Output from previous day: 12/04 0701 - 12/05 0700 In: 1760 [P.O.:960; I.V.:800] Out: 2550 [Urine:2550]   Physical Exam: General: Well developed, well nourished, in no acute distress. Head:  Normocephalic and atraumatic. Lungs: Clear to auscultation and percussion. Heart: Normal S1 and S2.  No murmur, rubs or gallops.  Pulses:  Radial site looks good.  Extremities: No clubbing or cyanosis. No edema. Neurologic: Alert and oriented x 3.    Lab Results:  [facility] 11/24/11 0549  WBC 8.2  HGB 12.4*  PLT 120*    Basename 11/24/11 0549  NA 137  K 4.1  CL 104  CO2 26  GLUCOSE 89  BUN 15  CREATININE 0.98   No results found for this basename: TROPONINI:2,CK,MB:2 in the last 72 hours Hepatic Function Panel No results found for this basename: PROT,ALBUMIN,AST,ALT,ALKPHOS,BILITOT,BILIDIR,IBILI in the last 72 hours No results found for this basename: CHOL in the last 72 hours No results found for this basename: PROTIME in the last 72 hours  Imaging: No results found.  EKG:  SB, otherwise normal  Cardiac Studies:  Neg enzymes.   Assessment/Plan:  CAD Status post PCI for Class III AP.   Plan:  Discharge today.             Follow up Dr. Shirlee Latch           DAPT          Continue pravachol. Importance of DAPT stressed.    Patient Active Hospital Problem List: No active hospital problems.      Shawnie Pons, MD, [facility], FSCAI 11/24/2011, 8:36 AM

## 2011-11-24 NOTE — Discharge Summary (Signed)
Physician Discharge Summary  Patient ID: Jessejames Steelman Feeley MRN: [id number] DOB/AGE: 1941/06/19 70 y.o.  Admit date: 11/23/2011 Discharge date: 11/24/2011  Primary Discharge Diagnosis: Chest pain Secondary Discharge Diagnosis:  Patient Active Problem List  Diagnoses  . AORTIC VALVE DISORDERS  . CARDIAC MURMUR  . CAROTID BRUIT  . CHEST PAIN  . NONSPECIFIC ABNORMAL UNSPEC CV FUNCTION STUDY  . HYPERLIPIDEMIA-MIXED  . Coronary artery disease 90% LAD by cath in January 2012, medical therapy recommended   . Bradycardia     Significant Diagnostic Studies: Cardiac catheterization, coronary arteriogram, left ventriculogram, PTCA and 2.5 x 28 mm part PROMUS drug-eluting stent to the LAD  Hospital Course: Mr. Hetzer is a 70 year old male with a history of coronary artery disease. His last cath was in January 2012 and medical therapy was recommended. He did not tolerate Imdur or and Ranexa was cost prohibitive. He had EKG changes on an exercise Myoview and was brought to the hospital for  Cath.  Cardiac cath:Left mainstem: 20% mid vessel stenosis.  Left anterior descending (LAD): Heavy LAD calcification. 50% ostial small D2. Long diseased segment in the mid LAD reaching 95% stenosis. The mid to distal LAD appeared small initially but did increase in caliber with intracoronary nitroglycerine.  Left circumflex (LCx): Large ramus with 30% ostial stenosis. Small AV circumflex with minimal disease.  Right coronary artery (RCA): Luminal irregularities in the RCA with 50% proximal PDA stenosis.  Left ventriculography: Left ventricular systolic function is normal, LVEF is estimated at 55-65%, there is no significant mitral regurgitation  Final Conclusions: Worsening exertional chest pain with inability to tolerate Imdur. Coronary angiography shows some progression of the tight mid-LAD lesion. The mid to distal LAD appeared small on initial imaging but increased in caliber with intracoronary nitroglycerin.   Dr.  [place] reviewed the results with Dr. Lucien Mons and elected to proceed with percutaneous intervention. Mr. Gallon had the drug-eluting stent described above placed to the mid LAD with 0% residual stenosis.  On 11/24/2011 Mr. Kramlich was seen by Dr. Riley Kill. He was ambulating without chest pain or shortness of breath. His post procedure labs were stable. Dr. Riley Kill feels that Mr. Grabel is stable for discharge, to followup as an outpatient.  Labs: Lab Results  Component Value Date   WBC 8.2 11/24/2011   HGB 12.4* 11/24/2011   HCT 35.3* 11/24/2011   MCV 90.3 11/24/2011   PLT 120* 11/24/2011    Lab 11/24/11 0549  NA 137  K 4.1  CL 104  CO2 26  BUN 15  CREATININE 0.98  CALCIUM 8.8  PROT --  BILITOT --  ALKPHOS --  ALT --  AST --  GLUCOSE 89    FOLLOW UP PLANS AND APPOINTMENTS Discharge Orders    Future Appointments: Provider: Department: Dept Phone: Center:   12/09/2011 8:30 AM [name], MD [facility] [phone] [facility]     Future Orders Please Complete By Expires   Diet - low sodium heart healthy      Increase activity slowly      Call MD for:  redness, tenderness, or signs of infection (pain, swelling, redness, odor or green/yellow discharge around incision site)        Allergies  Allergen Reactions    Imdur   headache   . Penicillins      Current Discharge Medication List    START taking these medications   Details  clopidogrel (PLAVIX) 75 MG tablet Take 1 tablet (75 mg total) by mouth daily with breakfast. Qty:  30 tablet, Refills: 11    nitroGLYCERIN (NITROSTAT) 0.4 MG SL tablet Place 1 tablet (0.4 mg total) under the tongue every 5 (five) minutes as needed for chest pain. Qty: 25 tablet, Refills: 3      CONTINUE these medications which have NOT CHANGED   Details  amLODipine (NORVASC) 2.5 MG tablet Take 1 tablet (2.5 mg total) by mouth daily. Qty: 30 tablet, Refills: 6   Associated Diagnoses: Chest pain, unspecified; Coronary atherosclerosis of  native coronary artery    aspirin 81 MG tablet Take 81 mg by mouth daily.      fluticasone (FLONASE) 50 MCG/ACT nasal spray Place 2 sprays into the nose daily.      lisinopril (PRINIVIL,ZESTRIL) 10 MG tablet Take 10 mg by mouth daily.      loratadine (CLARITIN) 10 MG tablet Take 10 mg by mouth daily.      magnesium gluconate (MAGONATE) 500 MG tablet Take 500 mg by mouth daily.      metoprolol tartrate (LOPRESSOR) 25 MG tablet Take 12.5 mg by mouth 2 (two) times daily.      Multiple Vitamins-Minerals (MULTIVITAMINS THER. W/MINERALS) TABS Take 2 tablets by mouth daily.      omega-3 acid ethyl esters (LOVAZA) 1 G capsule Take 1 g by mouth daily.      pravastatin (PRAVACHOL) 40 MG tablet Take 80 mg by mouth daily.         BRING ALL MEDICATIONS WITH YOU TO FOLLOW UP APPOINTMENTS  Time spent with patient to include physician time: 45 min Signed: Theodore Demark 11/24/2011, 9:02 AM Co-Sign MD

## 2011-12-06 ENCOUNTER — Other Ambulatory Visit: Payer: Self-pay | Admitting: *Deleted

## 2011-12-06 MED ORDER — LISINOPRIL 10 MG PO TABS: 10.0000 mg | ORAL_TABLET | Freq: Every day | ORAL | Status: DC

## 2011-12-09 ENCOUNTER — Ambulatory Visit (INDEPENDENT_AMBULATORY_CARE_PROVIDER_SITE_OTHER): Payer: Medicare Other | Admitting: Cardiology

## 2011-12-09 ENCOUNTER — Encounter: Payer: Self-pay | Admitting: Cardiology

## 2011-12-09 VITALS — BP 120/68 | HR 64 | Ht 71.0 in | Wt 193.0 lb

## 2011-12-09 DIAGNOSIS — I359 Nonrheumatic aortic valve disorder, unspecified: Secondary | ICD-10-CM

## 2011-12-09 DIAGNOSIS — R0989 Other specified symptoms and signs involving the circulatory and respiratory systems: Secondary | ICD-10-CM

## 2011-12-09 DIAGNOSIS — E785 Hyperlipidemia, unspecified: Secondary | ICD-10-CM

## 2011-12-09 DIAGNOSIS — I2510 Atherosclerotic heart disease of native coronary artery without angina pectoris: Secondary | ICD-10-CM

## 2011-12-09 MED ORDER — CLOPIDOGREL BISULFATE 75 MG PO TABS: 75.0000 mg | ORAL_TABLET | Freq: Every day | ORAL | Status: DC

## 2011-12-09 NOTE — Progress Notes (Signed)
PCP: Dr. Andrey Campanile  70 yo with history of HTN presented initially for evaluation of chest pain in 2011.  It occurred with splitting wood and after walking about 1/4 mile on flat ground.  Given these exertional symptoms, I set him up for an ETT-myoview. This showed evidence for ischemia by exercise ECG, and there was a small reversible apical perfusion defect possibly suggestive of ischemia as well.  Echo to assess for cause of systolic murmur showed a bicuspid aortic valve with preserved LV systolic function and no significant aortic stenosis.  I set him up for a left heart cath in 1/12, which showed heavy calcification in the LAD.  There was a long 90% mid LAD stenosis.  The LAD was small at this point, a 2-2.5 mm vessel.  I decided to proceed with medical treatment as his symptoms were only with moderate exertion and as the LAD was a small caliber vessel at the site of the stenosis.  Given the bicuspid aortic valve, I did an Travis angiogram of the thoracic aorta, which ruied out aortic aneurysm.    Travis Armstrong initially did very well on low dose metoprolol with no further chest discomfort.  He is quite active, playing golf and walking for exercise.  However, this fall, he noted episodes of mild substernal chest tightness.  Sometimes this was with exertion and sometimes it was at rest.  I initially wanted him to try ranolazine, but it was too expensive. Imdur gave him a bad headache.  I had him do an ETT-myoview: he had good exercise tolerance but developed mild chest discomfort.  There were significant ECG changes.  There was a small area of apical ischemia on myoview images that was similar to the prior myoview done before his cath.  I repeated a cath, this showed worsening of the mid LAD stenosis up to 95%.  He underwent PCI with Promus DES to the mid LAD.    Since PCI, his chest pain has resolved.  He has noted some lightheadedness with standing.  At last appointment prior to PCI, I had started him on amlodipine  for angina.    Labs (10/11): LDL 113, HDL 46, K 5.1, creatinine 1.1, LFTs normal Labs (1/12): K 5.3, creatinine 1.1 Labs (3/12): LDL 51, HDL 37 Labs (12/12): K 4.1, creatinine 0.98  Allergies (verified):  1)  ! Pcn  Past Medical History: 1. Actinic Keratosis 2. Muscle strains  3. HTN 4. CAD: Anginal-type chest pain.  ETT-myoview (1/12) showed good exercise tolerance but also inferior and V5/V6 1 mm ST depression and chest pain with exercise.  Perfusion images showed reversible apical perfusion defect.  Findings suggested ischemia.  LHC (1/12): LAD heavily calcified with 90% calcified mid LAD stenosis after D2.  The LAD was small caliber (2-2.5 mm) at this point.  Because of this and his lack of unstable symptoms, I elected to manage him medically initially.  ETT-myoview (11/12): 9'30" exercise with mild chest pain and ischemic ST changes.  There was a small reversible apical perfusion defect, similar to the 1/12 study.  LHC repeated 12/12 with 95% mid LAD stenosis after D2.  Promus DES to mid LAD.  5. Bicuspid aortic valve: Echo (1/12) showed EF 60-65%, bicuspid aortic valve with minimal stenosis (mean gradient 9 mmHg), grade II diastolic dysfunction, mild Travis, mild LAE.  Travis angiogram chest: No thoracic aortic aneurysm.   6. Carotid dopplers (1/12): No significant stenosis.  7.  Hyperlipidemia: Myalgias with Lipitor.  8.  Mild bradycardia  Family  History: 2 brothers with CAD diagnosed in their 45s.  Sister with diabetes, died of complications from this disease.   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   Current Outpatient Prescriptions  Medication Sig Dispense Refill  . amLODipine (NORVASC) 2.5 MG tablet Take 1 tablet (2.5 mg total) by mouth daily.  30 tablet  6  . aspirin 81 MG tablet Take 81 mg by mouth daily.         . clopidogrel (PLAVIX) 75 MG tablet Take 1 tablet (75 mg total) by mouth daily with breakfast.  30 tablet  11  . fluticasone (FLONASE) 50 MCG/ACT nasal spray Place 2 sprays into the nose daily.        Marland Kitchen lisinopril (PRINIVIL,ZESTRIL) 10 MG tablet Take 1 tablet (10 mg total) by mouth daily.  30 tablet  11  . loratadine (CLARITIN) 10 MG tablet Take 10 mg by mouth daily.        . magnesium gluconate (MAGONATE) 500 MG tablet Take 500 mg by mouth daily.        . metoprolol tartrate (LOPRESSOR) 25 MG tablet Take 12.5 mg by mouth 2 (two) times daily.        . Multiple Vitamins-Minerals (MULTIVITAMINS THER. W/MINERALS) TABS Take 2 tablets by mouth daily.        . nitroGLYCERIN (NITROSTAT) 0.4 MG SL tablet Place 1 tablet (0.4 mg total) under the tongue every 5 (five) minutes as needed for chest pain.  25 tablet  3  . omega-3 acid ethyl esters (LOVAZA) 1 G capsule Take 1 g by mouth daily.        . pravastatin (PRAVACHOL) 40 MG tablet Take 80 mg by mouth daily.          BP 120/68  Pulse 64  Ht 5\' 11"  (1.803 m)  Wt 87.544 kg (193 lb)  BMI 26.92 kg/m2 General:  Well developed, well nourished, in no acute distress. Neck:  Neck supple, no JVD. No masses, thyromegaly or abnormal cervical nodes. Lungs:  Clear bilaterally to auscultation and percussion. Heart:  Non-displaced PMI, chest non-tender; regular rate and rhythm, S1, S2.  2/6 early systolic crescendo-decrescendo murmur RUSB.  S2 heard clearly. +S4. Carotid upstroke normal, bilateral carotid bruits versus radiated from the heart.  Pedals normal pulses. No edema, no varicosities. Abdomen:  Bowel sounds positive; abdomen soft and non-tender without masses, organomegaly, or hernias noted. No hepatosplenomegaly. Extremities:  No clubbing or cyanosis. Neurologic:  Alert and oriented x 3. Psych:  Normal affect.

## 2011-12-09 NOTE — Assessment & Plan Note (Signed)
Bicuspid aortic valve with minimal stenosis and no associated thoracic aortic aneurysm.  Will need to monitor valve probably every other year unless stenosis appears progressive. 

## 2011-12-09 NOTE — Assessment & Plan Note (Signed)
Chest pain has resolved since LAD PCI.  He will need to continue Plavix for at least a year.  He has had some lightheaded that seems orthostatic on amlodipine.  I will have him stop amlodipine (he was on this for anginal relief).  Continue ASA 81, metoprolol, lisinopril, and statin.  He will start cardiac rehab.

## 2011-12-09 NOTE — Assessment & Plan Note (Signed)
Still trying to get lipids results from the [place].  He will bring a copy by.  Goal LDL < 70.

## 2011-12-09 NOTE — Assessment & Plan Note (Signed)
Mild stenosis only in 1/12.

## 2011-12-09 NOTE — Patient Instructions (Signed)
Stop amlodipine.  Dr Shirlee Latch recommends participation in Cardiac Rehab at [facility].  Your physician wants you to follow-up in: 4 months with Dr Shirlee Latch. (April 2013). You will receive a reminder letter in the mail two months in advance. If you don't receive a letter, please call our office to schedule the follow-up appointment.

## 2011-12-15 ENCOUNTER — Telehealth: Payer: Self-pay | Admitting: Cardiology

## 2011-12-15 NOTE — Telephone Encounter (Signed)
Patient's wife was called .[facility] [place] [facility] reason for Plavix [facility]. Copy of 12/09/11 office note faxed to [phone].

## 2011-12-15 NOTE — Telephone Encounter (Signed)
New Msg: Pt calling wanting to discuss clopidogrel. [facility] said [facility] reason why we put in RX request of this medication. Please return pt call to discuss further.

## 2012-01-06 ENCOUNTER — Encounter (HOSPITAL_COMMUNITY)
Admission: RE | Admit: 2012-01-06 | Discharge: 2012-01-06 | Disposition: A | Discharge status: Home / Self Care | Payer: Medicare Other | Source: Ambulatory Visit | Attending: Cardiology | Admitting: Cardiology

## 2012-01-06 ENCOUNTER — Encounter (HOSPITAL_COMMUNITY): Payer: Self-pay

## 2012-01-06 DIAGNOSIS — Z5189 Encounter for other specified aftercare: Secondary | ICD-10-CM | POA: Insufficient documentation

## 2012-01-06 DIAGNOSIS — I359 Nonrheumatic aortic valve disorder, unspecified: Secondary | ICD-10-CM | POA: Insufficient documentation

## 2012-01-06 DIAGNOSIS — R0989 Other specified symptoms and signs involving the circulatory and respiratory systems: Secondary | ICD-10-CM | POA: Insufficient documentation

## 2012-01-06 DIAGNOSIS — Z9861 Coronary angioplasty status: Secondary | ICD-10-CM | POA: Insufficient documentation

## 2012-01-06 DIAGNOSIS — R011 Cardiac murmur, unspecified: Secondary | ICD-10-CM | POA: Insufficient documentation

## 2012-01-06 DIAGNOSIS — E785 Hyperlipidemia, unspecified: Secondary | ICD-10-CM | POA: Insufficient documentation

## 2012-01-06 DIAGNOSIS — I2510 Atherosclerotic heart disease of native coronary artery without angina pectoris: Secondary | ICD-10-CM | POA: Insufficient documentation

## 2012-01-06 DIAGNOSIS — I200 Unstable angina: Secondary | ICD-10-CM | POA: Insufficient documentation

## 2012-01-10 ENCOUNTER — Encounter (HOSPITAL_COMMUNITY)
Admission: RE | Admit: 2012-01-10 | Discharge: 2012-01-10 | Disposition: A | Discharge status: Home / Self Care | Payer: Medicare Other | Source: Ambulatory Visit | Attending: Cardiology | Admitting: Cardiology

## 2012-01-10 NOTE — Progress Notes (Signed)
Pt started cardiac rehab today.  Pt tolerated light exercise without difficulty. Monitor shows SR with no noted ectopy during exercise.  Continue to monitor. Karlene Lineman RN

## 2012-01-12 ENCOUNTER — Encounter (HOSPITAL_COMMUNITY)
Admission: RE | Admit: 2012-01-12 | Discharge: 2012-01-12 | Disposition: A | Discharge status: Home / Self Care | Payer: Medicare Other | Source: Ambulatory Visit | Attending: Cardiology | Admitting: Cardiology

## 2012-01-14 ENCOUNTER — Encounter (HOSPITAL_COMMUNITY)
Admission: RE | Admit: 2012-01-14 | Discharge: 2012-01-14 | Disposition: A | Discharge status: Home / Self Care | Payer: Medicare Other | Source: Ambulatory Visit | Attending: Cardiology | Admitting: Cardiology

## 2012-01-17 ENCOUNTER — Encounter (HOSPITAL_COMMUNITY)
Admission: RE | Admit: 2012-01-17 | Discharge: 2012-01-17 | Disposition: A | Discharge status: Home / Self Care | Payer: Medicare Other | Source: Ambulatory Visit | Attending: Cardiology | Admitting: Cardiology

## 2012-01-17 NOTE — Progress Notes (Signed)
Travis Armstrong 71 y.o. male Nutrition Note  Spoke with pt.  Nutrition Plan and Nutrition Survey reviewed with pt. Pt is following a step 1 heart healthy diet. Weight loss tips discussed. Pt expressed understanding.   Nutrition Diagnosis   Food-and nutrition-related knowledge deficit related to lack of exposure to information as related to diagnosis of: ? CVD   Overweight related to excessive energy intake as evidenced by a BMI of 27.0  Nutrition RX/ Estimated Daily Nutrition Needs for: wt loss  1500-2000 Kcal , Total Fat 40-55gm, Saturated Fat 11-15 gm, Trans Fat 1.4-2.0 gm,  Sodium less than 1500 mg   Nutrition Intervention   Pt's individual nutrition plan including cholesterol goals reviewed with pt.   Benefits of adopting Therapeutic Lifestyle Changes discussed when Medficts reviewed.   Pt to attend the Portion Distortion class   Pt to attend the  ? Nutrition I class                     ? Nutrition II class   Pt given handouts for: ? wt loss    Continue client-centered nutrition education by RD, as part of interdisciplinary care. Goal(s)   Pt to identify food quantities necessary to achieve: ? wt loss to a goal wt of 182-186 lb (82.7-84.5 kg) at graduation from cardiac rehab.    Pt to describe the benefit of including fruits, vegetables, whole grains, and low-fat dairy products in a heart healthy meal plan. Monitor and Evaluate progress toward nutrition goal with team.

## 2012-01-17 NOTE — Progress Notes (Signed)
Travis Armstrong 71 y.o. male       Nutrition Screen                                                                    YES  NO Do you live in a nursing home?  X   Do you eat out more than 3 times/week?    X If yes, how many times per week do you eat out?  Do you have food allergies?   X If yes, what are you allergic to?  Have you gained or lost more than 10 lbs without trying?               X If yes, how much weight have you lost and over what time period?  lbs gained or lost over  weeks/month  Do you want to lose weight?    X  If yes, what is a goal weight or amount of weight you would like to lose? 8 lbs  Do you eat alone most of the time?   X   Do you eat less than 2 meals/day?  X If yes, how many meals do you eat?  Do you drink more than 3 alcohol drinks/day?  X If yes, how many drinks per day?  Are you having trouble with constipation? *  X If yes, what are you doing to help relieve constipation?  Do you have financial difficulties with buying food?*    X   Are you experiencing regular nausea/ vomiting?*     X   Do you have a poor appetite? *                                        X   Do you have trouble chewing/swallowing? *   X    Pt with diagnoses of:  X Stent/ PTCA X Dyslipidemia  / HDL< 40 / LDL>70 / High TG      X %  Body fat >goal / Body Mass Index >25 X HTN / BP >120/80       Pt Risk Score   1       Diagnosis Risk Score  20       Total Risk Score   21                         High Risk               X Low Risk              HT: 71" Ht Readings from Last 1 Encounters:  01/06/12 5\' 11"  (1.803 m)    WT:   193.2 lb (87.8 kg) Wt Readings from Last 3 Encounters:  01/06/12 193 lb 9 oz (87.8 kg)  12/09/11 193 lb (87.544 kg)  11/24/11 190 lb 0.6 oz (86.2 kg)     IBW 78.2 112%IBW BMI 27 29.1%body fat   Meds reviewed: Fish oil, Lutein, Calcium, Magnesium  Past Medical History  Diagnosis Date  . Keratosis, actinic   . Muscle strain   . HTN (hypertension)   . CAD  (coronary  artery disease)      Anginal-type chest pain.  ETT-myoview (1/12) showed good exercise tolerance but also inferior and  V5/V6 1 mm ST depression and chest pain with exercise.  Perfusion images showed reversible apical  perfusion defect.  Findings suggested ischemia.  LHC (1/12): LAD heavily calcified with 90% calcified mid  LAD stenosis after D2.  The LAD was small caliber (2-2.5 mm) at this point.    . Hyperlipidemia   . Angina   . Pneumonia 1958  . Bradycardia, sinus       Activity level: Pt is active  Wt goal: 182-186 lb ( 82.7-84.5 kg) Current tobacco use? No      Food/Drug Interaction? No      Labs:  Lipid Panel     Component Value Date/Time   CHOL 101 03/02/2011 0830   TRIG 64.0 03/02/2011 0830   HDL 37.10* 03/02/2011 0830   CHOLHDL 3 03/02/2011 0830   VLDL 12.8 03/02/2011 0830   LDLCALC 51 03/02/2011 0830   No results found for this basename: HGBA1C   11/24/11 Glucose 89   LDL goal: < 100      MI, DM, Carotid or PVD and > 2:      HTN, HDL, family h/o, > 71 yo male Estimated Daily Nutrition Needs for: ? wt loss  1500-2000 Kcal , Total Fat 40-55gm, Saturated Fat 11-15 gm, Trans Fat 1.4-2.0 gm,  Sodium less than 1500 mg

## 2012-01-19 ENCOUNTER — Encounter (HOSPITAL_COMMUNITY)
Admission: RE | Admit: 2012-01-19 | Discharge: 2012-01-19 | Disposition: A | Discharge status: Home / Self Care | Payer: Medicare Other | Source: Ambulatory Visit | Attending: Cardiology | Admitting: Cardiology

## 2012-01-21 ENCOUNTER — Encounter (HOSPITAL_COMMUNITY)
Admission: RE | Admit: 2012-01-21 | Discharge: 2012-01-21 | Disposition: A | Discharge status: Home / Self Care | Payer: Medicare Other | Source: Ambulatory Visit | Attending: Cardiology | Admitting: Cardiology

## 2012-01-21 DIAGNOSIS — I200 Unstable angina: Secondary | ICD-10-CM | POA: Insufficient documentation

## 2012-01-21 DIAGNOSIS — Z5189 Encounter for other specified aftercare: Secondary | ICD-10-CM | POA: Insufficient documentation

## 2012-01-21 DIAGNOSIS — R0989 Other specified symptoms and signs involving the circulatory and respiratory systems: Secondary | ICD-10-CM | POA: Insufficient documentation

## 2012-01-21 DIAGNOSIS — E785 Hyperlipidemia, unspecified: Secondary | ICD-10-CM | POA: Insufficient documentation

## 2012-01-21 DIAGNOSIS — I2510 Atherosclerotic heart disease of native coronary artery without angina pectoris: Secondary | ICD-10-CM | POA: Insufficient documentation

## 2012-01-21 DIAGNOSIS — Z9861 Coronary angioplasty status: Secondary | ICD-10-CM | POA: Insufficient documentation

## 2012-01-21 DIAGNOSIS — I359 Nonrheumatic aortic valve disorder, unspecified: Secondary | ICD-10-CM | POA: Insufficient documentation

## 2012-01-21 DIAGNOSIS — R011 Cardiac murmur, unspecified: Secondary | ICD-10-CM | POA: Insufficient documentation

## 2012-01-24 ENCOUNTER — Encounter (HOSPITAL_COMMUNITY)
Admission: RE | Admit: 2012-01-24 | Discharge: 2012-01-24 | Disposition: A | Discharge status: Home / Self Care | Payer: Medicare Other | Source: Ambulatory Visit | Attending: Cardiology | Admitting: Cardiology

## 2012-01-26 ENCOUNTER — Encounter (HOSPITAL_COMMUNITY)
Admission: RE | Admit: 2012-01-26 | Discharge: 2012-01-26 | Disposition: A | Discharge status: Home / Self Care | Payer: Medicare Other | Source: Ambulatory Visit | Attending: Cardiology | Admitting: Cardiology

## 2012-01-28 ENCOUNTER — Encounter (HOSPITAL_COMMUNITY)
Admission: RE | Admit: 2012-01-28 | Discharge: 2012-01-28 | Disposition: A | Discharge status: Home / Self Care | Payer: Medicare Other | Source: Ambulatory Visit | Attending: Cardiology | Admitting: Cardiology

## 2012-01-31 ENCOUNTER — Encounter (HOSPITAL_COMMUNITY)
Admission: RE | Admit: 2012-01-31 | Discharge: 2012-01-31 | Disposition: A | Discharge status: Home / Self Care | Payer: Medicare Other | Source: Ambulatory Visit | Attending: Cardiology | Admitting: Cardiology

## 2012-02-02 ENCOUNTER — Encounter (HOSPITAL_COMMUNITY)
Admission: RE | Admit: 2012-02-02 | Discharge: 2012-02-02 | Disposition: A | Discharge status: Home / Self Care | Payer: Medicare Other | Source: Ambulatory Visit | Attending: Cardiology | Admitting: Cardiology

## 2012-02-04 ENCOUNTER — Encounter (HOSPITAL_COMMUNITY): Payer: Medicare Other

## 2012-02-07 ENCOUNTER — Encounter (HOSPITAL_COMMUNITY): Payer: Medicare Other

## 2012-02-09 ENCOUNTER — Encounter (HOSPITAL_COMMUNITY): Payer: Medicare Other

## 2012-02-11 ENCOUNTER — Encounter (HOSPITAL_COMMUNITY): Payer: Medicare Other

## 2012-02-14 ENCOUNTER — Encounter (HOSPITAL_COMMUNITY)
Admission: RE | Admit: 2012-02-14 | Discharge: 2012-02-14 | Disposition: A | Discharge status: Home / Self Care | Payer: Medicare Other | Source: Ambulatory Visit | Attending: Cardiology | Admitting: Cardiology

## 2012-02-14 NOTE — Progress Notes (Signed)
Pt returned to exercise today after brief absence due to travel.  Pt tolerated exercise with no complaints.

## 2012-02-14 NOTE — Progress Notes (Signed)
Reviewed home exercise with pt today.  Pt plans to walk at home and go bowling ]for exercise.  Reviewed THR, pulse, RPE, sign and symptoms, NTG use, and when to call 911 or MD.  Pt voiced understanding. Electronically signed by Harriett Sine MS on Monday February 14 2012 at 0739 EST

## 2012-02-16 ENCOUNTER — Encounter (HOSPITAL_COMMUNITY)
Admission: RE | Admit: 2012-02-16 | Discharge: 2012-02-16 | Disposition: A | Discharge status: Home / Self Care | Payer: Medicare Other | Source: Ambulatory Visit | Attending: Cardiology | Admitting: Cardiology

## 2012-02-18 ENCOUNTER — Encounter (HOSPITAL_COMMUNITY)
Admission: RE | Admit: 2012-02-18 | Discharge: 2012-02-18 | Disposition: A | Discharge status: Home / Self Care | Payer: Medicare Other | Source: Ambulatory Visit | Attending: Cardiology | Admitting: Cardiology

## 2012-02-18 DIAGNOSIS — I200 Unstable angina: Secondary | ICD-10-CM | POA: Insufficient documentation

## 2012-02-18 DIAGNOSIS — Z9861 Coronary angioplasty status: Secondary | ICD-10-CM | POA: Insufficient documentation

## 2012-02-18 DIAGNOSIS — Z5189 Encounter for other specified aftercare: Secondary | ICD-10-CM | POA: Insufficient documentation

## 2012-02-18 DIAGNOSIS — E785 Hyperlipidemia, unspecified: Secondary | ICD-10-CM | POA: Insufficient documentation

## 2012-02-18 DIAGNOSIS — I359 Nonrheumatic aortic valve disorder, unspecified: Secondary | ICD-10-CM | POA: Insufficient documentation

## 2012-02-18 DIAGNOSIS — R011 Cardiac murmur, unspecified: Secondary | ICD-10-CM | POA: Insufficient documentation

## 2012-02-18 DIAGNOSIS — I2510 Atherosclerotic heart disease of native coronary artery without angina pectoris: Secondary | ICD-10-CM | POA: Insufficient documentation

## 2012-02-18 DIAGNOSIS — R0989 Other specified symptoms and signs involving the circulatory and respiratory systems: Secondary | ICD-10-CM | POA: Insufficient documentation

## 2012-02-21 ENCOUNTER — Encounter (HOSPITAL_COMMUNITY)
Admission: RE | Admit: 2012-02-21 | Discharge: 2012-02-21 | Disposition: A | Discharge status: Home / Self Care | Payer: Medicare Other | Source: Ambulatory Visit | Attending: Cardiology | Admitting: Cardiology

## 2012-02-23 ENCOUNTER — Encounter (HOSPITAL_COMMUNITY)
Admission: RE | Admit: 2012-02-23 | Discharge: 2012-02-23 | Disposition: A | Discharge status: Home / Self Care | Payer: Medicare Other | Source: Ambulatory Visit | Attending: Cardiology | Admitting: Cardiology

## 2012-02-25 ENCOUNTER — Encounter (HOSPITAL_COMMUNITY)
Admission: RE | Admit: 2012-02-25 | Discharge: 2012-02-25 | Disposition: A | Discharge status: Home / Self Care | Payer: Medicare Other | Source: Ambulatory Visit | Attending: Cardiology | Admitting: Cardiology

## 2012-02-28 ENCOUNTER — Encounter (HOSPITAL_COMMUNITY)
Admission: RE | Admit: 2012-02-28 | Discharge: 2012-02-28 | Disposition: A | Discharge status: Home / Self Care | Payer: Medicare Other | Source: Ambulatory Visit | Attending: Cardiology | Admitting: Cardiology

## 2012-02-29 ENCOUNTER — Other Ambulatory Visit: Payer: Self-pay | Admitting: Cardiology

## 2012-03-01 ENCOUNTER — Encounter (HOSPITAL_COMMUNITY)
Admission: RE | Admit: 2012-03-01 | Discharge: 2012-03-01 | Disposition: A | Discharge status: Home / Self Care | Payer: Medicare Other | Source: Ambulatory Visit | Attending: Cardiology | Admitting: Cardiology

## 2012-03-03 ENCOUNTER — Encounter (HOSPITAL_COMMUNITY)
Admission: RE | Admit: 2012-03-03 | Discharge: 2012-03-03 | Disposition: A | Discharge status: Home / Self Care | Payer: Medicare Other | Source: Ambulatory Visit | Attending: Cardiology | Admitting: Cardiology

## 2012-03-06 ENCOUNTER — Encounter (HOSPITAL_COMMUNITY)
Admission: RE | Admit: 2012-03-06 | Discharge: 2012-03-06 | Disposition: A | Discharge status: Home / Self Care | Payer: Medicare Other | Source: Ambulatory Visit | Attending: Cardiology | Admitting: Cardiology

## 2012-03-08 ENCOUNTER — Encounter (HOSPITAL_COMMUNITY)
Admission: RE | Admit: 2012-03-08 | Discharge: 2012-03-08 | Disposition: A | Discharge status: Home / Self Care | Payer: Medicare Other | Source: Ambulatory Visit | Attending: Cardiology | Admitting: Cardiology

## 2012-03-10 ENCOUNTER — Encounter (HOSPITAL_COMMUNITY)
Admission: RE | Admit: 2012-03-10 | Discharge: 2012-03-10 | Disposition: A | Discharge status: Home / Self Care | Payer: Medicare Other | Source: Ambulatory Visit | Attending: Cardiology | Admitting: Cardiology

## 2012-03-13 ENCOUNTER — Encounter (HOSPITAL_COMMUNITY)
Admission: RE | Admit: 2012-03-13 | Discharge: 2012-03-13 | Disposition: A | Discharge status: Home / Self Care | Payer: Medicare Other | Source: Ambulatory Visit | Attending: Cardiology | Admitting: Cardiology

## 2012-03-15 ENCOUNTER — Encounter (HOSPITAL_COMMUNITY)
Admission: RE | Admit: 2012-03-15 | Discharge: 2012-03-15 | Disposition: A | Discharge status: Home / Self Care | Payer: Medicare Other | Source: Ambulatory Visit | Attending: Cardiology | Admitting: Cardiology

## 2012-03-17 ENCOUNTER — Encounter (HOSPITAL_COMMUNITY)
Admission: RE | Admit: 2012-03-17 | Discharge: 2012-03-17 | Disposition: A | Discharge status: Home / Self Care | Payer: Medicare Other | Source: Ambulatory Visit | Attending: Cardiology | Admitting: Cardiology

## 2012-03-20 ENCOUNTER — Encounter (HOSPITAL_COMMUNITY)
Admission: RE | Admit: 2012-03-20 | Discharge: 2012-03-20 | Disposition: A | Discharge status: Home / Self Care | Payer: Medicare Other | Source: Ambulatory Visit | Attending: Cardiology | Admitting: Cardiology

## 2012-03-20 DIAGNOSIS — Z9861 Coronary angioplasty status: Secondary | ICD-10-CM | POA: Insufficient documentation

## 2012-03-20 DIAGNOSIS — I359 Nonrheumatic aortic valve disorder, unspecified: Secondary | ICD-10-CM | POA: Insufficient documentation

## 2012-03-20 DIAGNOSIS — I2510 Atherosclerotic heart disease of native coronary artery without angina pectoris: Secondary | ICD-10-CM | POA: Insufficient documentation

## 2012-03-20 DIAGNOSIS — E785 Hyperlipidemia, unspecified: Secondary | ICD-10-CM | POA: Insufficient documentation

## 2012-03-20 DIAGNOSIS — R011 Cardiac murmur, unspecified: Secondary | ICD-10-CM | POA: Insufficient documentation

## 2012-03-20 DIAGNOSIS — Z5189 Encounter for other specified aftercare: Secondary | ICD-10-CM | POA: Insufficient documentation

## 2012-03-20 DIAGNOSIS — I200 Unstable angina: Secondary | ICD-10-CM | POA: Insufficient documentation

## 2012-03-20 DIAGNOSIS — R0989 Other specified symptoms and signs involving the circulatory and respiratory systems: Secondary | ICD-10-CM | POA: Insufficient documentation

## 2012-03-22 ENCOUNTER — Encounter (HOSPITAL_COMMUNITY)
Admission: RE | Admit: 2012-03-22 | Discharge: 2012-03-22 | Disposition: A | Discharge status: Home / Self Care | Payer: Medicare Other | Source: Ambulatory Visit | Attending: Cardiology | Admitting: Cardiology

## 2012-03-24 ENCOUNTER — Encounter (HOSPITAL_COMMUNITY)
Admission: RE | Admit: 2012-03-24 | Discharge: 2012-03-24 | Disposition: A | Discharge status: Home / Self Care | Payer: Medicare Other | Source: Ambulatory Visit | Attending: Cardiology | Admitting: Cardiology

## 2012-03-27 ENCOUNTER — Encounter (HOSPITAL_COMMUNITY)
Admission: RE | Admit: 2012-03-27 | Discharge: 2012-03-27 | Disposition: A | Discharge status: Home / Self Care | Payer: Medicare Other | Source: Ambulatory Visit | Attending: Cardiology | Admitting: Cardiology

## 2012-03-28 ENCOUNTER — Ambulatory Visit (INDEPENDENT_AMBULATORY_CARE_PROVIDER_SITE_OTHER): Payer: Medicare Other | Admitting: Cardiology

## 2012-03-28 ENCOUNTER — Encounter: Payer: Self-pay | Admitting: Cardiology

## 2012-03-28 VITALS — BP 125/65 | HR 51 | Ht 71.0 in | Wt 194.0 lb

## 2012-03-28 DIAGNOSIS — E7800 Pure hypercholesterolemia, unspecified: Secondary | ICD-10-CM

## 2012-03-28 DIAGNOSIS — I359 Nonrheumatic aortic valve disorder, unspecified: Secondary | ICD-10-CM

## 2012-03-28 DIAGNOSIS — R0989 Other specified symptoms and signs involving the circulatory and respiratory systems: Secondary | ICD-10-CM

## 2012-03-28 DIAGNOSIS — E785 Hyperlipidemia, unspecified: Secondary | ICD-10-CM

## 2012-03-28 DIAGNOSIS — I2510 Atherosclerotic heart disease of native coronary artery without angina pectoris: Secondary | ICD-10-CM

## 2012-03-28 LAB — LIPID PANEL
Cholesterol: 113 mg/dL (ref 0–200)
LDL Cholesterol: 53 mg/dL (ref 0–99)
Total CHOL/HDL Ratio: 3
Triglycerides: 75 mg/dL (ref 0.0–149.0)
VLDL: 15 mg/dL (ref 0.0–40.0)

## 2012-03-28 LAB — BASIC METABOLIC PANEL
BUN: 12 mg/dL (ref 6–23)
Creatinine, Ser: 1.2 mg/dL (ref 0.4–1.5)
GFR: 65.33 mL/min (ref >60.00)
Potassium: 4.2 mEq/L (ref 3.5–5.1)

## 2012-03-28 NOTE — Progress Notes (Signed)
PCP: Dr. Andrey Campanile  71 yo with history of HTN presented initially for evaluation of chest pain in 2011.  It occurred with splitting wood and after walking about 1/4 mile on flat ground.  Given these exertional symptoms, I set him up for an ETT-myoview. This showed evidence for ischemia by exercise ECG, and there was a small reversible apical perfusion defect possibly suggestive of ischemia as well.  Echo to assess for cause of systolic murmur showed a bicuspid aortic valve with preserved LV systolic function and no significant aortic stenosis.  I set him up for a left heart cath in 1/12, which showed heavy calcification in the LAD.  There was a long 90% mid LAD stenosis.  The LAD was small at this point, a 2-2.5 mm vessel.  I decided to proceed with medical treatment as his symptoms were only with moderate exertion and as the LAD was a small caliber vessel at the site of the stenosis.  Given the bicuspid aortic valve, I did an MR angiogram of the thoracic aorta, which ruied out aortic aneurysm.    [name] initially did very well on low dose metoprolol with no further chest discomfort.  He is quite active, playing golf and walking for exercise.  However, last fall, he noted episodes of mild substernal chest tightness.  Sometimes this was with exertion and sometimes it was at rest.  I initially wanted him to try ranolazine, but it was too expensive. Imdur gave him a bad headache.  I had him do an ETT-myoview: he had good exercise tolerance but developed mild chest discomfort.  There were significant ECG changes.  There was a small area of apical ischemia on myoview images that was similar to the prior myoview done before his cath.  I repeated a cath, this showed worsening of the mid LAD stenosis up to 95%.  He underwent PCI with Promus DES to the mid LAD.    Since PCI, his chest pain has resolved.  He has been doing cardiac rehab.  No exertional dyspnea.  He plans to participate in the [employer] later this month  (running, bowling, shot put, etc).  He does report some muscle soreness with heavy activity like bowling or chopping wood.      Labs (10/11): LDL 113, HDL 46, K 5.1, creatinine 1.1, LFTs normal Labs (1/12): K 5.3, creatinine 1.1 Labs (3/12): LDL 51, HDL 37 Labs (12/12): K 4.1, creatinine 0.98  Allergies (verified):  1)  ! Pcn  Past Medical History: 1. Actinic Keratosis 2. Muscle strains  3. HTN 4. CAD: Anginal-type chest pain.  ETT-myoview (1/12) showed good exercise tolerance but also inferior and V5/V6 1 mm ST depression and chest pain with exercise.  Perfusion images showed reversible apical perfusion defect.  Findings suggested ischemia.  LHC (1/12): LAD heavily calcified with 90% calcified mid LAD stenosis after D2.  The LAD was small caliber (2-2.5 mm) at this point.  Because of this and his lack of unstable symptoms, I elected to manage him medically initially.  ETT-myoview (11/12): 9'30" exercise with mild chest pain and ischemic ST changes.  There was a small reversible apical perfusion defect, similar to the 1/12 study.  LHC repeated 12/12 with 95% mid LAD stenosis after D2.  Promus DES to mid LAD.  5. Bicuspid aortic valve: Echo (1/12) showed EF 60-65%, bicuspid aortic valve with minimal stenosis (mean gradient 9 mmHg), grade II diastolic dysfunction, mild MR, mild LAE.  MR angiogram chest: No thoracic aortic aneurysm.  6. Carotid dopplers (1/12): No significant stenosis.  7.  Hyperlipidemia: Myalgias with Lipitor.  8.  Mild bradycardia  Family History: 2 brothers with CAD diagnosed in their 69s.  Sister with diabetes, died of complications from this disease.   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 clopidogrel (PLAVIX) 75 MG tablet Take 1 tablet (75 mg total) by mouth daily.  90 tablet  3  .  fluticasone (FLONASE) 50 MCG/ACT nasal spray Place 2 sprays into the nose daily.        Marland Kitchen lisinopril (PRINIVIL,ZESTRIL) 10 MG tablet Take 1 tablet (10 mg total) by mouth daily.  30 tablet  11  . loratadine (CLARITIN) 10 MG tablet Take 10 mg by mouth daily.        . magnesium gluconate (MAGONATE) 500 MG tablet Take 500 mg by mouth daily.        . metoprolol tartrate (LOPRESSOR) 25 MG tablet TAKE ONE TABLET BY MOUTH TWICE DAILY  180 tablet  6  . Multiple Vitamins-Minerals (MULTIVITAMINS THER. W/MINERALS) TABS Take 2 tablets by mouth daily.        . nitroGLYCERIN (NITROSTAT) 0.4 MG SL tablet Place 1 tablet (0.4 mg total) under the tongue every 5 (five) minutes as needed for chest pain.  25 tablet  3  . omega-3 acid ethyl esters (LOVAZA) 1 G capsule Take 1 g by mouth daily.        . pravastatin (PRAVACHOL) 40 MG tablet TAKE TWO TABLETS BY MOUTH EVERY DAY  180 tablet  6  . aspirin 81 MG tablet Take 81 mg by mouth daily.        . Coenzyme Q10 200 MG TABS One tablet daily    0    BP 125/65  Pulse 51  Ht 5\' 11"  (1.803 m)  Wt 194 lb (87.998 kg)  BMI 27.06 kg/m2 General:  Well developed, well nourished, in no acute distress. Neck:  Neck supple, no JVD. No masses, thyromegaly or abnormal cervical nodes. Lungs:  Clear bilaterally to auscultation and percussion. Heart:  Non-displaced PMI, chest non-tender; regular rate and rhythm, S1, S2.  2/6 early systolic crescendo-decrescendo murmur RUSB.  S2 heard clearly. +S4. Carotid upstroke normal, bilateral carotid bruits versus radiated from the heart.  Pedals normal pulses. No edema, no varicosities. Abdomen:  Bowel sounds positive; abdomen soft and non-tender without masses, organomegaly, or hernias noted. No hepatosplenomegaly. Extremities:  No clubbing or cyanosis. Neurologic:  Alert and oriented x 3. Psych:  Normal affect.

## 2012-03-28 NOTE — Assessment & Plan Note (Signed)
Chest pain has resolved since LAD PCI.  He will need to continue Plavix for at least a year.  Continue ASA 81, metoprolol, lisinopril, and statin.

## 2012-03-28 NOTE — Assessment & Plan Note (Signed)
Myalgias with heavy exertion.  Could be statin-related.  Will get lipids/LFTs fasting today.  He will start coenzyme Q10 200 mg daily.  Will consider switch to Crestor as may have less symptoms with a different statin.

## 2012-03-28 NOTE — Assessment & Plan Note (Addendum)
Bicuspid aortic valve with minimal stenosis and no associated thoracic aortic aneurysm.  Will need to monitor valve probably every other year unless stenosis appears progressive. 

## 2012-03-28 NOTE — Patient Instructions (Signed)
Take coenzyme Q10 200mg  daily for muscle soreness.  Your physician recommends that you have a FASTING lipid profile /BMET 414.01  272.0 today.  Your physician wants you to follow-up in: 6 months with Dr Shirlee Latch. (October 2013). You will receive a reminder letter in the mail two months in advance. If you don't receive a letter, please call our office to schedule the follow-up appointment.

## 2012-03-28 NOTE — Assessment & Plan Note (Signed)
Mild stenosis only in 1/12.  

## 2012-03-29 ENCOUNTER — Encounter (HOSPITAL_COMMUNITY)
Admission: RE | Admit: 2012-03-29 | Discharge: 2012-03-29 | Disposition: A | Discharge status: Home / Self Care | Payer: Medicare Other | Source: Ambulatory Visit | Attending: Cardiology | Admitting: Cardiology

## 2012-03-31 ENCOUNTER — Encounter (HOSPITAL_COMMUNITY)
Admission: RE | Admit: 2012-03-31 | Discharge: 2012-03-31 | Disposition: A | Discharge status: Home / Self Care | Payer: Medicare Other | Source: Ambulatory Visit | Attending: Cardiology | Admitting: Cardiology

## 2012-04-03 ENCOUNTER — Encounter (HOSPITAL_COMMUNITY)
Admission: RE | Admit: 2012-04-03 | Discharge: 2012-04-03 | Disposition: A | Discharge status: Home / Self Care | Payer: Medicare Other | Source: Ambulatory Visit | Attending: Cardiology | Admitting: Cardiology

## 2012-04-04 ENCOUNTER — Telehealth: Payer: Self-pay | Admitting: Cardiology

## 2012-04-04 NOTE — Telephone Encounter (Signed)
Spoke with pt about recent lab results 

## 2012-04-04 NOTE — Telephone Encounter (Signed)
Pt rtn call from yesterday, pls call [phone]

## 2012-04-05 ENCOUNTER — Encounter (HOSPITAL_COMMUNITY)
Admission: RE | Admit: 2012-04-05 | Discharge: 2012-04-05 | Disposition: A | Discharge status: Home / Self Care | Payer: Medicare Other | Source: Ambulatory Visit | Attending: Cardiology | Admitting: Cardiology

## 2012-04-07 ENCOUNTER — Encounter (HOSPITAL_COMMUNITY)
Admission: RE | Admit: 2012-04-07 | Discharge: 2012-04-07 | Disposition: A | Discharge status: Home / Self Care | Payer: Medicare Other | Source: Ambulatory Visit | Attending: Cardiology | Admitting: Cardiology

## 2012-04-07 NOTE — Progress Notes (Signed)
Pt c/o increased bruising present on upper arms bilaterally.  No pain, swelling, hematomas present. Pt reassured.  Pt will continue current regimen. Pt advised to contact Dr.McLean if bruising persists or worsens. Understanding verbalized.  Will continue to monitor.

## 2012-04-10 ENCOUNTER — Encounter (HOSPITAL_COMMUNITY)
Admission: RE | Admit: 2012-04-10 | Discharge: 2012-04-10 | Disposition: A | Discharge status: Home / Self Care | Payer: Medicare Other | Source: Ambulatory Visit | Attending: Cardiology | Admitting: Cardiology

## 2012-04-12 ENCOUNTER — Encounter (HOSPITAL_COMMUNITY): Payer: Medicare Other

## 2012-04-13 NOTE — Progress Notes (Signed)
Addendum to Nutrition Section of Cardiac Rehab Program Progress Report  Pt following a step 1 Therapeutic Lifestyle Changes diet on admission to rehab. No post-rehab data available to evaluate.

## 2012-04-14 ENCOUNTER — Encounter (HOSPITAL_COMMUNITY): Payer: Medicare Other

## 2012-06-15 ENCOUNTER — Telehealth: Payer: Self-pay | Admitting: Cardiology

## 2012-06-15 NOTE — Telephone Encounter (Signed)
Please return call to patient at [phone] regarding veterans association documents

## 2012-06-15 NOTE — Telephone Encounter (Signed)
Pt is inquiring about his VA paperwork?????

## 2012-06-16 NOTE — Telephone Encounter (Signed)
Spoke with pt. Pt states [employer] is requesting records to for a ?disability determination. I forwarded pt to HIM to help pt get needed medical records. Dr Shirlee Latch does not received a  form from [place]  to complete-pt is aware.

## 2012-09-25 ENCOUNTER — Encounter: Payer: Self-pay | Admitting: *Deleted

## 2012-09-25 ENCOUNTER — Encounter: Payer: Self-pay | Admitting: Cardiology

## 2012-09-25 ENCOUNTER — Ambulatory Visit (INDEPENDENT_AMBULATORY_CARE_PROVIDER_SITE_OTHER): Payer: Medicare Other | Admitting: Cardiology

## 2012-09-25 VITALS — BP 124/88 | HR 53 | Ht 71.0 in | Wt 196.8 lb

## 2012-09-25 DIAGNOSIS — R079 Chest pain, unspecified: Secondary | ICD-10-CM

## 2012-09-25 DIAGNOSIS — I359 Nonrheumatic aortic valve disorder, unspecified: Secondary | ICD-10-CM

## 2012-09-25 DIAGNOSIS — E785 Hyperlipidemia, unspecified: Secondary | ICD-10-CM

## 2012-09-25 DIAGNOSIS — I2510 Atherosclerotic heart disease of native coronary artery without angina pectoris: Secondary | ICD-10-CM

## 2012-09-25 DIAGNOSIS — R0989 Other specified symptoms and signs involving the circulatory and respiratory systems: Secondary | ICD-10-CM

## 2012-09-25 LAB — BASIC METABOLIC PANEL
Calcium: 9.5 mg/dL (ref 8.4–10.5)
GFR: 73.91 mL/min (ref >60.00)
Sodium: 137 mEq/L (ref 135–145)

## 2012-09-25 LAB — CBC WITH DIFFERENTIAL/PLATELET
Basophils Relative: 0.5 % (ref 0.0–3.0)
Eosinophils Relative: 1.3 % (ref 0.0–5.0)
Hemoglobin: 13.2 g/dL (ref 13.0–17.0)
Lymphocytes Relative: 24.3 % (ref 12.0–46.0)
Monocytes Relative: 13.6 % — ABNORMAL HIGH (ref 3.0–12.0)
Neutro Abs: 4.1 10*3/uL (ref 1.4–7.7)
RBC: 4.19 Mil/uL — ABNORMAL LOW (ref 4.22–5.81)

## 2012-09-25 NOTE — Patient Instructions (Addendum)
Your physician recommends that you have  lab work today--BMET/CBC/PT/INR.  Your physician has requested that you have a cardiac catheterization. Cardiac catheterization is used to diagnose and/or treat various heart conditions. Doctors may recommend this procedure for a number of different reasons. The most common reason is to evaluate chest pain. Chest pain can be a symptom of coronary artery disease (CAD), and cardiac catheterization can show whether plaque is narrowing or blocking your heart's arteries. This procedure is also used to evaluate the valves, as well as measure the blood flow and oxygen levels in different parts of your heart. For further information please visit [email or web address] Please follow instruction sheet, as given. Friday October 11,2013  Your physician recommends that you schedule a follow-up appointment in: 3 weeks with Dr Shirlee Latch.

## 2012-09-25 NOTE — Progress Notes (Signed)
Patient ID: Travis Armstrong, male   DOB: 1941-04-01, 71 y.o.   MRN: [id number] PCP: Dr. Andrey Armstrong  71 yo with history of HTN presented initially for evaluation of chest pain in 2011.  It occurred with splitting wood and after walking about 1/4 mile on flat ground.  Given these exertional symptoms, I set him up for an ETT-myoview. This showed evidence for ischemia by exercise ECG, and there was a small reversible apical perfusion defect possibly suggestive of ischemia as well.  Echo to assess for cause of systolic murmur showed a bicuspid aortic valve with preserved LV systolic function and no significant aortic stenosis.  I set him up for a left heart cath in 1/12, which showed heavy calcification in the LAD.  There was a long 90% mid LAD stenosis.  The LAD was small at this point, a 2-2.5 mm vessel.  I decided to proceed with medical treatment as his symptoms were only with moderate exertion and as the LAD was a small caliber vessel at the site of the stenosis.  Given the bicuspid aortic valve, I did an Travis angiogram of the thoracic aorta, which ruled out aortic aneurysm.    Travis Armstrong initially did very well on low dose metoprolol with no further chest discomfort. However, last fall, he noted episodes of mild substernal chest tightness.  Sometimes this was with exertion and sometimes it was at rest.  I had him do an ETT-myoview: he had good exercise tolerance but developed mild chest discomfort.  There were significant ECG changes.  There was a small area of apical ischemia on myoview images that was similar to the prior myoview done before his cath.  I repeated a cath, this showed worsening of the mid LAD stenosis up to 95%.  He underwent PCI with Promus DES to the mid LAD in 12/12.    After PCI, his chest pain resolved.  He participated in the [employer] [place] in 5/13.  However, about 3 months ago, he again began to develop episodes of exertional chest pain.  This has been worse over the last 2-3 weeks.  He will get chest  tightness after walking about 1/4 mile that will resolve with rest.  He got chest tightness Saturday when walking up a hill.  He has gotten chest tightness with yardwork and with housework like mopping.  He feels like it is actually worse now than prior to his stent last December.  No syncope/lightheadedness.  No chest pain at rest.  No prolonged chest pain.  He has not taken NTG.    ECG: NSR, slightly biphasic anteroseptal T waves  Labs (10/11): LDL 113, HDL 46, K 5.1, creatinine 1.1, LFTs normal Labs (1/12): K 5.3, creatinine 1.1 Labs (3/12): LDL 51, HDL 37 Labs (12/12): K 4.1, creatinine 0.98 Labs (4/13): LDL 53, HDL 45 Labs (9/13): LDL 54, HDL 46, LFTs normal, TSH normal, K 4.7, creatinine 0.9  Allergies (verified):  1)  ! Pcn  Past Medical History: 1. Actinic Keratosis 2. Muscle strains  3. HTN 4. CAD: Anginal-type chest pain.  ETT-myoview (1/12) showed good exercise tolerance but also inferior and V5/V6 1 mm ST depression and chest pain with exercise.  Perfusion images showed reversible apical perfusion defect.  Findings suggested ischemia.  LHC (1/12): LAD heavily calcified with 90% calcified mid LAD stenosis after D2.  The LAD was small caliber (2-2.5 mm) at this point.  Because of this and his lack of unstable symptoms, I elected to manage him medically initially.  ETT-myoview (11/12): 9'30" exercise with mild chest pain and ischemic ST changes.  There was a small reversible apical perfusion defect, similar to the 1/12 study.  LHC repeated 12/12 with 95% mid LAD stenosis after D2.  Promus DES to mid LAD.  5. Bicuspid aortic valve: Echo (1/12) showed EF 60-65%, bicuspid aortic valve with minimal stenosis (mean gradient 9 mmHg), grade II diastolic dysfunction, mild Travis, mild LAE.  Travis angiogram chest: No thoracic aortic aneurysm.   6. Carotid dopplers (1/12): No significant stenosis.  7.  Hyperlipidemia: Myalgias with Lipitor.  8.  Mild bradycardia  Family History: 2 brothers with CAD  diagnosed in their 36s.  Sister with diabetes, died of complications from this disease.   Social History: denied alcohol use caffeine use former smoker but quit over 30 years ago Lives in [place] Retired truck driver Travis Armstrong veteran  ROS:  All systems reviewed and negative except as per HPI.    Current Outpatient Prescriptions  Medication Sig Dispense Refill  . aspirin 81 MG tablet Take 81 mg by mouth daily.        . clopidogrel (PLAVIX) 75 MG tablet Take 1 tablet (75 mg total) by mouth daily.  90 tablet  3  . fluticasone (FLONASE) 50 MCG/ACT nasal spray Place 2 sprays into the nose daily.        Marland Kitchen lisinopril (PRINIVIL,ZESTRIL) 10 MG tablet Take 1 tablet (10 mg total) by mouth daily.  30 tablet  11  . loratadine (CLARITIN) 10 MG tablet Take 10 mg by mouth daily.        . magnesium gluconate (MAGONATE) 500 MG tablet Take 500 mg by mouth daily.        . metoprolol tartrate (LOPRESSOR) 25 MG tablet TAKE ONE TABLET BY MOUTH TWICE DAILY  180 tablet  6  . Multiple Vitamins-Minerals (MULTIVITAMINS THER. W/MINERALS) TABS Take 2 tablets by mouth daily.        . nitroGLYCERIN (NITROSTAT) 0.4 MG SL tablet Place 1 tablet (0.4 mg total) under the tongue every 5 (five) minutes as needed for chest pain.  25 tablet  3  . omega-3 acid ethyl esters (LOVAZA) 1 G capsule Take 1 g by mouth daily.        . pravastatin (PRAVACHOL) 40 MG tablet TAKE TWO TABLETS BY MOUTH EVERY DAY  180 tablet  6    BP 124/88  Pulse 53  Ht 5\' 11"  (1.803 m)  Wt 196 lb 12.8 oz (89.268 kg)  BMI 27.45 kg/m2 General:  Well developed, well nourished, in no acute distress. Neck:  Neck supple, no JVD. No masses, thyromegaly or abnormal cervical nodes. Lungs:  Clear bilaterally to auscultation and percussion. Heart:  Non-displaced PMI, chest non-tender; regular rate and rhythm, S1, S2.  2/6 early systolic crescendo-decrescendo murmur RUSB.  S2 heard clearly. +S4. Carotid upstroke normal, bilateral carotid bruits versus  radiated from the heart.  Pedals normal pulses. No edema, no varicosities. Abdomen:  Bowel sounds positive; abdomen soft and non-tender without masses, organomegaly, or hernias noted. No hepatosplenomegaly. Extremities:  No clubbing or cyanosis. Neurologic:  Alert and oriented x 3. Psych:  Normal affect.  Assessment/Plan:  AORTIC VALVE DISORDERS Bicuspid aortic valve with minimal stenosis and no associated thoracic aortic aneurysm. Will need to monitor valve probably every other year unless stenosis appears progressive.  Will plan echo 1/14.  CAROTID BRUIT  Mild stenosis only in 1/12.  Coronary artery disease  Patient has had a recurrence of anginal-type typical chest pain that has been  somewhat progressive over the last 2-3 weeks.  He has not been able to tolerate Imdur due to headache and ranolazine was too expensive.  HR already 53 on metoprolol.  I will plan on left heart cath, using radial access.  I talked to Travis Stennett about risks/benefits of procedure today.  I worry that he has had in-stent restenosis in the small-caliber LAD.  If this is the case, I will need to consider single vessel CABG with LIMA-LAD as I think repeat PCI to the same site would be unlikely to give long-term success.  Continue ASA 81, Plavix, metoprolol, lisinopril, and statin.  HYPERLIPIDEMIA-MIXED  LDL at goal (< 70) when checked this month.  He has mild myalgias occasionally that are tolerable.   [name] 09/25/2012 12:27 PM

## 2012-09-29 ENCOUNTER — Inpatient Hospital Stay (HOSPITAL_BASED_OUTPATIENT_CLINIC_OR_DEPARTMENT_OTHER)
Admission: RE | Admit: 2012-09-29 | Discharge: 2012-09-29 | Disposition: A | Discharge status: Home / Self Care | Payer: Medicare Other | Source: Ambulatory Visit | Attending: Cardiology | Admitting: Cardiology

## 2012-09-29 ENCOUNTER — Encounter (HOSPITAL_BASED_OUTPATIENT_CLINIC_OR_DEPARTMENT_OTHER)
Admission: RE | Disposition: A | Discharge status: Home / Self Care | Payer: Self-pay | Source: Ambulatory Visit | Attending: Cardiology

## 2012-09-29 DIAGNOSIS — T82897A Other specified complication of cardiac prosthetic devices, implants and grafts, initial encounter: Secondary | ICD-10-CM | POA: Insufficient documentation

## 2012-09-29 DIAGNOSIS — I359 Nonrheumatic aortic valve disorder, unspecified: Secondary | ICD-10-CM | POA: Insufficient documentation

## 2012-09-29 DIAGNOSIS — I2510 Atherosclerotic heart disease of native coronary artery without angina pectoris: Secondary | ICD-10-CM

## 2012-09-29 DIAGNOSIS — Z9861 Coronary angioplasty status: Secondary | ICD-10-CM | POA: Insufficient documentation

## 2012-09-29 DIAGNOSIS — Q231 Congenital insufficiency of aortic valve: Secondary | ICD-10-CM | POA: Insufficient documentation

## 2012-09-29 DIAGNOSIS — Y831 Surgical operation with implant of artificial internal device as the cause of abnormal reaction of the patient, or of later complication, without mention of misadventure at the time of the procedure: Secondary | ICD-10-CM | POA: Insufficient documentation

## 2012-09-29 LAB — CBC
HCT: 35.7 % — ABNORMAL LOW (ref 39.0–52.0)
MCV: 89 fL (ref 78.0–100.0)
Platelets: 129 10*3/uL — ABNORMAL LOW (ref 150–400)
RBC: 4.01 MIL/uL — ABNORMAL LOW (ref 4.22–5.81)
WBC: 6.9 10*3/uL (ref 4.0–10.5)

## 2012-09-29 LAB — POCT ACTIVATED CLOTTING TIME: Activated Clotting Time: 209 seconds

## 2012-09-29 SURGERY — JV LEFT HEART CATHETERIZATION WITH CORONARY ANGIOGRAM
Anesthesia: Moderate Sedation

## 2012-09-29 MED ORDER — SODIUM CHLORIDE 0.9 % IV SOLN: INTRAVENOUS | Status: DC

## 2012-09-29 MED ORDER — ONDANSETRON HCL 4 MG/2ML IJ SOLN: 4.0000 mg | Freq: Four times a day (QID) | INTRAMUSCULAR | Status: DC | PRN

## 2012-09-29 MED ORDER — HEPARIN (PORCINE) IN NACL 100-0.45 UNIT/ML-% IJ SOLN
1100.0000 [IU]/h | INTRAMUSCULAR | Status: DC
  Administered 2012-09-29: 1100 [IU]/h via INTRAVENOUS
  Filled 2012-09-29: qty 250

## 2012-09-29 MED ORDER — ACETAMINOPHEN 325 MG PO TABS: 650.0000 mg | ORAL_TABLET | ORAL | Status: DC | PRN

## 2012-09-29 NOTE — Progress Notes (Signed)
PCI cancelled for today.  ACT 209, will recheck in 30 mins to try and pull arterial radial line.

## 2012-09-29 NOTE — OR Nursing (Signed)
Discharge instructions reviewed and signed, pt stated understanding, ambulated in hall without difficulty, site level 0, transported to wife's car via wheelchair 

## 2012-09-29 NOTE — OR Nursing (Signed)
+  Allen's test right hand 

## 2012-09-29 NOTE — Progress Notes (Signed)
Right radial sheath removed by Kingsley Plan, 15cc of air in TR band.

## 2012-09-29 NOTE — Progress Notes (Signed)
ANTICOAGULATION CONSULT NOTE - Initial Consult  Pharmacy Consult for Heparin Indication: PCI  Allergies  Allergen Reactions  . Penicillins     RASH ON FEET    Patient Measurements: Height: 5\' 11"  (180.3 cm) Weight: 196 lb (88.905 kg) IBW/kg (Calculated) : 75.3  Heparin Dosing Weight: 88.9 kg  Vital Signs: BP: 141/70 mmHg (10/11 0837)  Labs: No results found for this basename: HGB:2,HCT:3,PLT:3,APTT:3,LABPROT:3,INR:3,HEPARINUNFRC:3,CREATININE:3,CKTOTAL:3,CKMB:3,TROPONINI:3 in the last 72 hours  Estimated Creatinine Clearance: 65.6 ml/min (by C-G formula based on Cr of 1.1).   Medical History: Past Medical History  Diagnosis Date  . Keratosis, actinic   . Muscle strain   . HTN (hypertension)   . CAD (coronary artery disease)      Anginal-type chest pain.  ETT-myoview (1/12) showed good exercise tolerance but also inferior and  V5/V6 1 mm ST depression and chest pain with exercise.  Perfusion images showed reversible apical  perfusion defect.  Findings suggested ischemia.  LHC (1/12): LAD heavily calcified with 90% calcified mid  LAD stenosis after D2.  The LAD was small caliber (2-2.5 mm) at this point.    . Hyperlipidemia   . Angina   . Pneumonia 1958  . Bradycardia, sinus       Assessment: 71 yo male with exertional chest pain, progressive over last two weeks to start on heparin infusion after cardiac cath which found severe stenosis in proximal LAD just before stented segment. Hx of LAD stent placed 12/12. Planning for PCI of LAD stenosis. Labs from 10/7: SCr 1.1 wnl (CrCl ~66 ml/min), CBC ok (plts 181), INR 1.0. No bolus s/p diagnostic cath.   Goal of Therapy:  Heparin level 0.3-0.7 units/ml Monitor platelets by anticoagulation protocol: Yes   Plan:  1. Start Heparin infusion at 1100 units/hr (=11 ml/hr), NO bolus 2. Heparin level in 8h with CBC 3. F/u need for daily heparin level and CBC  Regino Schultze, Takiah Maiden L 09/29/2012,11:02 AM

## 2012-09-29 NOTE — Interval H&P Note (Signed)
History and Physical Interval Note:  09/29/2012 9:48 AM  Travis Armstrong  has presented today for surgery, with the diagnosis of chest pain  The various methods of treatment have been discussed with the patient and family. After consideration of risks, benefits and other options for treatment, the patient has consented to  Procedure(s) (LRB) with comments: JV LEFT HEART CATHETERIZATION WITH CORONARY ANGIOGRAM (N/A) as a surgical intervention .  The patient's history has been reviewed, patient examined, no change in status, stable for surgery.  I have reviewed the patient's chart and labs.  Questions were answered to the patient's satisfaction.     Ardis Lawley [employer]

## 2012-09-29 NOTE — H&P (View-Only) (Signed)
Patient ID: Travis Armstrong, male   DOB: 05/09/1941, 71 y.o.   MRN: [id number] PCP: Dr. Wilson  71 yo with history of HTN presented initially for evaluation of chest pain in 2011.  It occurred with splitting wood and after walking about 1/4 mile on flat ground.  Given these exertional symptoms, I set him up for an ETT-myoview. This showed evidence for ischemia by exercise ECG, and there was a small reversible apical perfusion defect possibly suggestive of ischemia as well.  Echo to assess for cause of systolic murmur showed a bicuspid aortic valve with preserved LV systolic function and no significant aortic stenosis.  I set him up for a left heart cath in 1/12, which showed heavy calcification in the LAD.  There was a long 90% mid LAD stenosis.  The LAD was small at this point, a 2-2.5 mm vessel.  I decided to proceed with medical treatment as his symptoms were only with moderate exertion and as the LAD was a small caliber vessel at the site of the stenosis.  Given the bicuspid aortic valve, I did an Travis angiogram of the thoracic aorta, which ruled out aortic aneurysm.    Travis Armstrong initially did very well on low dose metoprolol with no further chest discomfort. However, last fall, he noted episodes of mild substernal chest tightness.  Sometimes this was with exertion and sometimes it was at rest.  I had him do an ETT-myoview: he had good exercise tolerance but developed mild chest discomfort.  There were significant ECG changes.  There was a small area of apical ischemia on myoview images that was similar to the prior myoview done before his cath.  I repeated a cath, this showed worsening of the mid LAD stenosis up to 95%.  He underwent PCI with Promus DES to the mid LAD in 12/12.    After PCI, his chest pain resolved.  He participated in the [employer] [place] in 5/13.  However, about 3 months ago, he again began to develop episodes of exertional chest pain.  This has been worse over the last 2-3 weeks.  He will get chest  tightness after walking about 1/4 mile that will resolve with rest.  He got chest tightness Saturday when walking up a hill.  He has gotten chest tightness with yardwork and with housework like mopping.  He feels like it is actually worse now than prior to his stent last December.  No syncope/lightheadedness.  No chest pain at rest.  No prolonged chest pain.  He has not taken NTG.    ECG: NSR, slightly biphasic anteroseptal T waves  Labs (10/11): LDL 113, HDL 46, K 5.1, creatinine 1.1, LFTs normal Labs (1/12): K 5.3, creatinine 1.1 Labs (3/12): LDL 51, HDL 37 Labs (12/12): K 4.1, creatinine 0.98 Labs (4/13): LDL 53, HDL 45 Labs (9/13): LDL 54, HDL 46, LFTs normal, TSH normal, K 4.7, creatinine 0.9  Allergies (verified):  1)  ! Pcn  Past Medical History: 1. Actinic Keratosis 2. Muscle strains  3. HTN 4. CAD: Anginal-type chest pain.  ETT-myoview (1/12) showed good exercise tolerance but also inferior and V5/V6 1 mm ST depression and chest pain with exercise.  Perfusion images showed reversible apical perfusion defect.  Findings suggested ischemia.  LHC (1/12): LAD heavily calcified with 90% calcified mid LAD stenosis after D2.  The LAD was small caliber (2-2.5 mm) at this point.  Because of this and his lack of unstable symptoms, I elected to manage him medically initially.    ETT-myoview (11/12): 9'30" exercise with mild chest pain and ischemic ST changes.  There was a small reversible apical perfusion defect, similar to the 1/12 study.  LHC repeated 12/12 with 95% mid LAD stenosis after D2.  Promus DES to mid LAD.  5. Bicuspid aortic valve: Echo (1/12) showed EF 60-65%, bicuspid aortic valve with minimal stenosis (mean gradient 9 mmHg), grade II diastolic dysfunction, mild Travis, mild LAE.  Travis angiogram chest: No thoracic aortic aneurysm.   6. Carotid dopplers (1/12): No significant stenosis.  7.  Hyperlipidemia: Myalgias with Lipitor.  8.  Mild bradycardia  Family History: 2 brothers with CAD  diagnosed in their 60s.  Sister with diabetes, died of complications from this disease.   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    Current Outpatient Prescriptions  Medication Sig Dispense Refill  . aspirin 81 MG tablet Take 81 mg by mouth daily.        . clopidogrel (PLAVIX) 75 MG tablet Take 1 tablet (75 mg total) by mouth daily.  90 tablet  3  . fluticasone (FLONASE) 50 MCG/ACT nasal spray Place 2 sprays into the nose daily.        . lisinopril (PRINIVIL,ZESTRIL) 10 MG tablet Take 1 tablet (10 mg total) by mouth daily.  30 tablet  11  . loratadine (CLARITIN) 10 MG tablet Take 10 mg by mouth daily.        . magnesium gluconate (MAGONATE) 500 MG tablet Take 500 mg by mouth daily.        . metoprolol tartrate (LOPRESSOR) 25 MG tablet TAKE ONE TABLET BY MOUTH TWICE DAILY  180 tablet  6  . Multiple Vitamins-Minerals (MULTIVITAMINS THER. W/MINERALS) TABS Take 2 tablets by mouth daily.        . nitroGLYCERIN (NITROSTAT) 0.4 MG SL tablet Place 1 tablet (0.4 mg total) under the tongue every 5 (five) minutes as needed for chest pain.  25 tablet  3  . omega-3 acid ethyl esters (LOVAZA) 1 G capsule Take 1 g by mouth daily.        . pravastatin (PRAVACHOL) 40 MG tablet TAKE TWO TABLETS BY MOUTH EVERY DAY  180 tablet  6    BP 124/88  Pulse 53  Ht 5' 11" (1.803 m)  Wt 196 lb 12.8 oz (89.268 kg)  BMI 27.45 kg/m2 General:  Well developed, well nourished, in no acute distress. Neck:  Neck supple, no JVD. No masses, thyromegaly or abnormal cervical nodes. Lungs:  Clear bilaterally to auscultation and percussion. Heart:  Non-displaced PMI, chest non-tender; regular rate and rhythm, S1, S2.  2/6 early systolic crescendo-decrescendo murmur RUSB.  S2 heard clearly. +S4. Carotid upstroke normal, bilateral carotid bruits versus  radiated from the heart.  Pedals normal pulses. No edema, no varicosities. Abdomen:  Bowel sounds positive; abdomen soft and non-tender without masses, organomegaly, or hernias noted. No hepatosplenomegaly. Extremities:  No clubbing or cyanosis. Neurologic:  Alert and oriented x 3. Psych:  Normal affect.  Assessment/Plan:  AORTIC VALVE DISORDERS Bicuspid aortic valve with minimal stenosis and no associated thoracic aortic aneurysm. Will need to monitor valve probably every other year unless stenosis appears progressive.  Will plan echo 1/14.  CAROTID BRUIT  Mild stenosis only in 1/12.  Coronary artery disease  Patient has had a recurrence of anginal-type typical chest pain that has been   somewhat progressive over the last 2-3 weeks.  He has not been able to tolerate Imdur due to headache and ranolazine was too expensive.  HR already 53 on metoprolol.  I will plan on left heart cath, using radial access.  I talked to Travis Armstrong about risks/benefits of procedure today.  I worry that he has had in-stent restenosis in the small-caliber LAD.  If this is the case, I will need to consider single vessel CABG with LIMA-LAD as I think repeat PCI to the same site would be unlikely to give long-term success.  Continue ASA 81, Plavix, metoprolol, lisinopril, and statin.  HYPERLIPIDEMIA-MIXED  LDL at goal (< 70) when checked this month.  He has mild myalgias occasionally that are tolerable.   Dantavious Snowball 09/25/2012 12:27 PM   

## 2012-09-29 NOTE — CV Procedure (Addendum)
   Cardiac Catheterization Procedure Note  Name: Travis Armstrong MRN: [id number] DOB: [birth year]  Procedure:  Selective Coronary Angiography  Indication: Exertional chest pain, progressive over the last 2 weeks with limited options for medical management given heart rate and past intolerances.  No rest pain.  LAD stent placed in 12/12.    Procedural Details: The right wrist was prepped, draped, and anesthetized with 1% lidocaine. Using the modified Seldinger technique, a 5 French sheath was introduced into the right radial artery. 3 mg of verapamil was administered through the sheath, weight-based unfractionated heparin was administered intravenously. Standard Judkins catheters were used for selective coronary angiography.  Left ventriculography was not done as the patient has a bicuspid aortic valve with some aortic stenosis and the valve was not easily crossed with the pigtail catheter. Catheter exchanges were performed over an exchange length guidewire. There were no immediate procedural complications. A TR band was used for radial hemostasis at the completion of the procedure.  The patient was transferred to the post catheterization recovery area for further monitoring.  Procedural Findings: Hemodynamics: AO 124/61  Coronary angiography: Coronary dominance: right  Left mainstem: 20% mid LM stenosis.   Left anterior descending (LAD): There was a long stented segment in the mid LAD. There was discrete 99% stenosis in the proximal LAD just prior to the stented segment.  There was diffuse 30% in-stent restenosis.  There was TIMI 3 flow to the mid and distal LAD.  There was a small diagonal just proximal to the LAD stenosis with 50% ostial stenosis.   Left circumflex (LCx): There was a large ramus with luminal irregularities. The AV LCx itself was small with 40% ostial stenosis.   Right coronary artery (RCA): Diffuse mild luminal irregularities.   Left ventriculography: Not done.  Known bicuspid  valve with mild AS, unable to easily cross with pigtail catheter.  Will evaluate with echocardiogram.   Final Conclusions:  Severe stenosis in proximal LAD just prior to the stented segment in the mid LAD.  Discussed with Dr. Riley Kill: will attempt PCI to the LAD stenosis.  Will make CVTS aware in case PCI is unsuccessful.  If that is the case, I think he would be a candidate for single vessel LIMA-LAD.  In that case, he would need echo to reassess EF and degree of aortic stenosis.  Patient took his Plavix this morning.   [name] 09/29/2012, 10:19 AM   There have been several add on urgent cases in the main lab that must be done.  I discussed with Dr. Riley Kill, and as [name] has a radial sheath, he prefers to bring him back on Monday for PCI.  He has had only exertional chest pain, no chest pain at rest.  He has been instructed to take it very easy this weekend and present for PCI on Monday morning.  If he has pain at rest, come to ER.   [name] 09/29/2012 12:10 PM

## 2012-10-01 ENCOUNTER — Other Ambulatory Visit: Payer: Self-pay | Admitting: Cardiology

## 2012-10-01 DIAGNOSIS — I25810 Atherosclerosis of coronary artery bypass graft(s) without angina pectoris: Secondary | ICD-10-CM

## 2012-10-02 ENCOUNTER — Encounter (HOSPITAL_COMMUNITY)
Admission: RE | Disposition: A | Discharge status: Home / Self Care | Payer: Self-pay | Source: Ambulatory Visit | Attending: Cardiology

## 2012-10-02 ENCOUNTER — Other Ambulatory Visit: Payer: Self-pay

## 2012-10-02 ENCOUNTER — Encounter (HOSPITAL_COMMUNITY): Payer: Self-pay | Admitting: Nurse Practitioner

## 2012-10-02 ENCOUNTER — Ambulatory Visit (HOSPITAL_COMMUNITY)
Admission: RE | Admit: 2012-10-02 | Discharge: 2012-10-03 | Disposition: A | Discharge status: Home / Self Care | Payer: Medicare Other | Source: Ambulatory Visit | Attending: Cardiology | Admitting: Cardiology

## 2012-10-02 DIAGNOSIS — Q231 Congenital insufficiency of aortic valve: Secondary | ICD-10-CM | POA: Insufficient documentation

## 2012-10-02 DIAGNOSIS — I10 Essential (primary) hypertension: Secondary | ICD-10-CM | POA: Insufficient documentation

## 2012-10-02 DIAGNOSIS — I25810 Atherosclerosis of coronary artery bypass graft(s) without angina pectoris: Secondary | ICD-10-CM

## 2012-10-02 DIAGNOSIS — I2510 Atherosclerotic heart disease of native coronary artery without angina pectoris: Secondary | ICD-10-CM

## 2012-10-02 DIAGNOSIS — I359 Nonrheumatic aortic valve disorder, unspecified: Secondary | ICD-10-CM

## 2012-10-02 DIAGNOSIS — I209 Angina pectoris, unspecified: Secondary | ICD-10-CM

## 2012-10-02 DIAGNOSIS — R079 Chest pain, unspecified: Secondary | ICD-10-CM

## 2012-10-02 DIAGNOSIS — E785 Hyperlipidemia, unspecified: Secondary | ICD-10-CM | POA: Diagnosis present

## 2012-10-02 DIAGNOSIS — I200 Unstable angina: Secondary | ICD-10-CM | POA: Insufficient documentation

## 2012-10-02 HISTORY — PX: PERCUTANEOUS CORONARY STENT INTERVENTION (PCI-S): SHX5485

## 2012-10-02 HISTORY — PX: CORONARY ANGIOPLASTY WITH STENT PLACEMENT: SHX49

## 2012-10-02 LAB — BASIC METABOLIC PANEL
BUN: 14 mg/dL (ref 6–23)
Chloride: 101 mEq/L (ref 96–112)
Creatinine, Ser: 1.11 mg/dL (ref 0.50–1.35)
GFR calc non Af Amer: 65 mL/min — ABNORMAL LOW (ref >90)
Glucose, Bld: 98 mg/dL (ref 70–99)
Potassium: 4.3 mEq/L (ref 3.5–5.1)

## 2012-10-02 LAB — CBC
HCT: 38.8 % — ABNORMAL LOW (ref 39.0–52.0)
Hemoglobin: 13.7 g/dL (ref 13.0–17.0)
MCHC: 35.3 g/dL (ref 30.0–36.0)
MCV: 91.1 fL (ref 78.0–100.0)

## 2012-10-02 SURGERY — PERCUTANEOUS CORONARY STENT INTERVENTION (PCI-S)
Anesthesia: Moderate Sedation

## 2012-10-02 SURGERY — PERCUTANEOUS CORONARY STENT INTERVENTION (PCI-S)
Anesthesia: LOCAL

## 2012-10-02 MED ORDER — SODIUM CHLORIDE 0.9 % IJ SOLN: 3.0000 mL | INTRAMUSCULAR | Status: DC | PRN

## 2012-10-02 MED ORDER — MAGNESIUM GLUCONATE 500 MG PO TABS
500.0000 mg | ORAL_TABLET | Freq: Every day | ORAL | Status: DC
  Administered 2012-10-03: 09:00:00 500 mg via ORAL
  Filled 2012-10-02: qty 1

## 2012-10-02 MED ORDER — FENTANYL CITRATE 0.05 MG/ML IJ SOLN
INTRAMUSCULAR | Status: AC
Start: 2012-10-02 — End: 2012-10-02
  Filled 2012-10-02: qty 2

## 2012-10-02 MED ORDER — ASPIRIN 81 MG PO TABS: 81.0000 mg | ORAL_TABLET | Freq: Every day | ORAL | Status: DC

## 2012-10-02 MED ORDER — CLOPIDOGREL BISULFATE 75 MG PO TABS
75.0000 mg | ORAL_TABLET | Freq: Every day | ORAL | Status: DC
  Administered 2012-10-03: 09:00:00 75 mg via ORAL
  Filled 2012-10-02: qty 1

## 2012-10-02 MED ORDER — SODIUM CHLORIDE 0.9 % IJ SOLN: 3.0000 mL | Freq: Two times a day (BID) | INTRAMUSCULAR | Status: DC

## 2012-10-02 MED ORDER — ONDANSETRON HCL 4 MG/2ML IJ SOLN: 4.0000 mg | Freq: Four times a day (QID) | INTRAMUSCULAR | Status: DC | PRN

## 2012-10-02 MED ORDER — CLOPIDOGREL BISULFATE 75 MG PO TABS: 75.0000 mg | ORAL_TABLET | ORAL | Status: DC

## 2012-10-02 MED ORDER — ASPIRIN 81 MG PO CHEW
324.0000 mg | CHEWABLE_TABLET | ORAL | Status: AC
  Administered 2012-10-02: 324 mg via ORAL

## 2012-10-02 MED ORDER — LORATADINE 10 MG PO TABS
10.0000 mg | ORAL_TABLET | Freq: Every day | ORAL | Status: DC
  Filled 2012-10-02 (×3): qty 1

## 2012-10-02 MED ORDER — HEPARIN (PORCINE) IN NACL 2-0.9 UNIT/ML-% IJ SOLN
INTRAMUSCULAR | Status: AC
  Filled 2012-10-02: qty 1000

## 2012-10-02 MED ORDER — METOPROLOL TARTRATE 25 MG PO TABS
25.0000 mg | ORAL_TABLET | Freq: Two times a day (BID) | ORAL | Status: DC
  Filled 2012-10-02 (×3): qty 1

## 2012-10-02 MED ORDER — SODIUM CHLORIDE 0.9 % IV SOLN
INTRAVENOUS | Status: DC
  Administered 2012-10-02: 1000 mL via INTRAVENOUS

## 2012-10-02 MED ORDER — BIVALIRUDIN 250 MG IV SOLR
INTRAVENOUS | Status: AC
  Filled 2012-10-02: qty 250

## 2012-10-02 MED ORDER — NITROGLYCERIN 0.4 MG SL SUBL: 0.4000 mg | SUBLINGUAL_TABLET | SUBLINGUAL | Status: DC | PRN

## 2012-10-02 MED ORDER — ACETAMINOPHEN 325 MG PO TABS: 650.0000 mg | ORAL_TABLET | ORAL | Status: DC | PRN

## 2012-10-02 MED ORDER — PROMETHAZINE HCL 25 MG/ML IJ SOLN
6.2500 mg | Freq: Three times a day (TID) | INTRAMUSCULAR | Status: DC | PRN
  Filled 2012-10-02: qty 1

## 2012-10-02 MED ORDER — CLOPIDOGREL BISULFATE 300 MG PO TABS
ORAL_TABLET | ORAL | Status: AC
  Filled 2012-10-02: qty 1

## 2012-10-02 MED ORDER — LIDOCAINE HCL (PF) 1 % IJ SOLN
INTRAMUSCULAR | Status: AC
  Filled 2012-10-02: qty 30

## 2012-10-02 MED ORDER — PRAVASTATIN SODIUM 40 MG PO TABS
80.0000 mg | ORAL_TABLET | Freq: Every day | ORAL | Status: DC
  Administered 2012-10-02: 23:00:00 80 mg via ORAL
  Filled 2012-10-02 (×2): qty 2

## 2012-10-02 MED ORDER — NON FORMULARY: 80.0000 mg | Freq: Every day | Status: DC

## 2012-10-02 MED ORDER — ASPIRIN 81 MG PO CHEW
CHEWABLE_TABLET | ORAL | Status: AC
  Administered 2012-10-02: 324 mg via ORAL
  Filled 2012-10-02: qty 3

## 2012-10-02 MED ORDER — SODIUM CHLORIDE 0.9 % IV SOLN
INTRAVENOUS | Status: AC
  Administered 2012-10-02: 14:00:00 via INTRAVENOUS

## 2012-10-02 MED ORDER — NITROGLYCERIN 0.2 MG/ML ON CALL CATH LAB
INTRAVENOUS | Status: AC
  Filled 2012-10-02: qty 1

## 2012-10-02 MED ORDER — ATORVASTATIN CALCIUM 80 MG PO TABS
80.0000 mg | ORAL_TABLET | Freq: Every day | ORAL | Status: DC
  Filled 2012-10-02: qty 1

## 2012-10-02 MED ORDER — OMEGA-3-ACID ETHYL ESTERS 1 G PO CAPS
1.0000 g | ORAL_CAPSULE | Freq: Every day | ORAL | Status: DC
  Filled 2012-10-02 (×2): qty 1

## 2012-10-02 MED ORDER — LISINOPRIL 10 MG PO TABS
10.0000 mg | ORAL_TABLET | Freq: Every day | ORAL | Status: DC
  Administered 2012-10-03: 10 mg via ORAL
  Filled 2012-10-02: qty 1

## 2012-10-02 MED ORDER — SODIUM CHLORIDE 0.9 % IV SOLN
250.0000 mL | INTRAVENOUS | Status: DC | PRN
Start: 2012-10-02 — End: 2012-10-02

## 2012-10-02 MED ORDER — ASPIRIN 81 MG PO CHEW
81.0000 mg | CHEWABLE_TABLET | Freq: Every day | ORAL | Status: DC
  Administered 2012-10-03: 09:00:00 81 mg via ORAL
  Filled 2012-10-02: qty 1

## 2012-10-02 MED ORDER — VERAPAMIL HCL 2.5 MG/ML IV SOLN
INTRAVENOUS | Status: AC
  Filled 2012-10-02: qty 2

## 2012-10-02 MED ORDER — MIDAZOLAM HCL 2 MG/2ML IJ SOLN
INTRAMUSCULAR | Status: AC
  Filled 2012-10-02: qty 2

## 2012-10-02 NOTE — Interval H&P Note (Signed)
History and Physical Interval Note:  10/02/2012 11:34 AM  Travis Armstrong  has presented today for surgery, with the diagnosis of CAD  The various methods of treatment have been discussed with the patient and family. After consideration of risks, benefits and other options for treatment, the patient has consented to  Procedure(s) (LRB) with comments: PERCUTANEOUS CORONARY STENT INTERVENTION (PCI-S) (N/A) as a surgical intervention .  The patient's history has been reviewed, patient examined, no change in status, stable for surgery.  I have reviewed the patient's chart and labs.  Questions were answered to the patient's satisfaction.   Dr. Shirlee Latch and reviewed all of the films in detail.  I have discussed this with the patient and family.  He has progressive class III angina with high grade restenosis.  Risks benefits and alternatives have been discussed.      Shawnie Pons

## 2012-10-02 NOTE — Progress Notes (Signed)
Stable.  Plan home in the am.

## 2012-10-02 NOTE — CV Procedure (Signed)
   CARDIAC CATH NOTE  Name: Travis Armstrong MRN: [id number] DOB: [birth year]  Procedure: PTCA and stenting of the LAD  Indication: progressive exertional angina  (class III) with evidence of re-narrowing proximal to prior stent  Procedural Details: The right wrist was prepped, draped, and anesthetized with 1% lidocaine. Using the modified Seldinger technique, a 6 Fr sheath was introduced into the radial artery. 3 mg verapamil was administered through the radial sheath. Weight-based bivalirudin was given for anticoagulation. Once a therapeutic ACT was achieved, a 6 [place] JL3.5 guide catheter was inserted.  A prowater coronary guidewire was used to cross the lesion.  The lesion was predilated with a 1.5, 2.25, and 2.25 [place]  balloon.  The lesion was then stented with a 2.25 by 16mm Promus stent.  The stent was postdilated with a 2.5 noncompliant balloon to high pressures..  Following PCI, there was 10% residual stenosis and TIMI-3 flow. Final angiography confirmed an excellent result.  The diagonal, which came off in the lesion, was unchanged.  There was very mild distal in stent restenosis in the old stent.   The patient tolerated the procedure well. There were no immediate procedural complications. A TR band was used for radial hemostasis. The patient was transferred to the post catheterization recovery area for further monitoring.  Lesion Data: Vessel: LAD Percent stenosis (pre): 99% TIMI-flow (pre):  2 Stent:  2.25 by 16 mm Promus Element---post dilated to 2.69mm [place] Sprinter to 18 atm Percent stenosis (post): <10 TIMI-flow (post): 3  Conclusions:  1.  Successful PCI of the LAD using DES proximal to prior treatment site.     Recommendations:  1.  Continue DAPT.  2.  Early fu with DM.   Shawnie Pons 10/02/2012, 1:39 PM

## 2012-10-02 NOTE — H&P (Signed)
Patient ID: Travis Armstrong MRN: [id number], DOB/AGE: 71-Oct-1942   Admit date: 10/02/2012   Primary Physician: Pamelia Hoit, MD Primary Cardiologist: Golden Circle, MD  Pt. Profile:  71 y/o male who presents today for PCI of the LAD.  Problem List  Past Medical History  Diagnosis Date  . Keratosis, actinic   . Muscle strain   . HTN (hypertension)   . CAD (coronary artery disease)     a. 1/12  ETT-myoview: Inf &  V5/V6 1 mm ST dep w/ c/p - apical ishcemia;  b. 12/2010 Cath: LAD heavily calcified with 90% calcified mid  LAD stenosis after D2.  The LAD was small caliber (2-2.5 mm) at this point.;  c. 09/2012 Cath: LM 20, LAD 99p - prior to stent, 30 ISR, D1 50, LCX sm 40ost, RI large minor irregs, RCA diff minor irregs.;   . Hyperlipidemia   . Pneumonia 1958  . Bradycardia, sinus     Past Surgical History  Procedure Date  . Coronary angioplasty with stent placement 11/23/11    X 1  . Hernia repair   . Inguinal hernia repair ~ 2007    left    Allergies  Allergies  Allergen Reactions  . Penicillins     RASH ON FEET   HPI  See clinic note from 10/7 for complete details.  Pt underwent cath on 10/11 revealing severe proximal LAD dzs.  He returns today for PCI.  He had no c/p or dyspnea over the weekend.  Home Medications  Prior to Admission medications   Medication Sig Start Date End Date Taking? Authorizing Provider  aspirin 81 MG tablet Take 81 mg by mouth daily.     Yes Historical Provider, MD  clopidogrel (PLAVIX) 75 MG tablet Take 1 tablet (75 mg total) by mouth daily. 12/09/11 12/08/12 Yes Laurey Morale, MD  fluticasone [facility]) 50 MCG/ACT nasal spray Place 2 sprays into the nose daily.     Yes Historical Provider, MD  lisinopril (PRINIVIL,ZESTRIL) 10 MG tablet Take 1 tablet (10 mg total) by mouth daily. 12/06/11  Yes Laurey Morale, MD  loratadine (CLARITIN) 10 MG tablet Take 10 mg by mouth daily.     Yes Historical Provider, MD  magnesium gluconate (MAGONATE)  500 MG tablet Take 500 mg by mouth daily.     Yes Historical Provider, MD  metoprolol tartrate (LOPRESSOR) 25 MG tablet Take 25 mg by mouth 2 (two) times daily.   Yes Historical Provider, MD  Multiple Vitamins-Minerals (MULTIVITAMINS THER. W/MINERALS) TABS Take 2 tablets by mouth daily.     Yes Historical Provider, MD  omega-3 acid ethyl esters (LOVAZA) 1 G capsule Take 1 g by mouth daily.     Yes Historical Provider, MD  pravastatin (PRAVACHOL) 40 MG tablet Take 80 mg by mouth daily.   Yes Historical Provider, MD  nitroGLYCERIN (NITROSTAT) 0.4 MG SL tablet Place 1 tablet (0.4 mg total) under the tongue every 5 (five) minutes as needed for chest pain. 11/24/11 11/23/12  Darrol Jump, PA   Family History  Family History  Problem Relation Age of Onset  . Coronary artery disease Brother    Social History  History   Social History  . Marital Status: Married    Spouse Name: N/A    Number of Children: N/A  . Years of Education: N/A   Occupational History  . Not on file.   Social History Main Topics  . Smoking status: Former Smoker -- 1.0 packs/day for 8  years    Types: Cigarettes    Quit date: 03/28/1972  . Smokeless tobacco: Never Used  . Alcohol Use: Yes     "last alcohol was ~ 1990's"  . Drug Use: No  . Sexually Active: Not Currently     "stopped smoking cigarettes 1973"   Other Topics Concern  . Not on file   Social History Narrative   Lives in [place] with his wife.  Retired.   Review of Systems General:  No chills, fever, night sweats or weight changes.  Cardiovascular:  No chest pain, dyspnea on exertion, edema, orthopnea, palpitations, paroxysmal nocturnal dyspnea. Dermatological: No rash, lesions/masses Respiratory: No cough, dyspnea Urologic: No hematuria, dysuria Abdominal:   No nausea, vomiting, diarrhea, bright red blood per rectum, melena, or hematemesis Neurologic:  No visual changes, wkns, changes in mental status. All other systems reviewed and are  otherwise negative except as noted above.  Physical Exam  Blood pressure 128/70, pulse 52, temperature 96.7 F (35.9 C), temperature source Oral, resp. rate 18, height 5\' 11"  (1.803 m), weight 196 lb (88.905 kg), SpO2 100.00%.  General: Pleasant, NAD Psych: Normal affect. Neuro: Alert and oriented X 3. Moves all extremities spontaneously. HEENT: Normal  Neck: Supple without bruits or JVD. Lungs:  Resp regular and unlabored, CTA. Heart: RRR no s3, s4, or murmurs. Abdomen: Soft, non-tender, non-distended, BS + x 4.  Extremities: No clubbing, cyanosis or edema. DP/PT/Radials 2+ and equal bilaterally.  R wrist cath site w/o b/b/h.  Labs  Lab Results  Component Value Date   WBC 5.8 10/02/2012   HGB 13.7 10/02/2012   HCT 38.8* 10/02/2012   MCV 91.1 10/02/2012   PLT 128* 10/02/2012     Lab 10/02/12 0857  NA 136  K 4.3  CL 101  CO2 29  BUN 14  CREATININE 1.11  CALCIUM 9.3  PROT --  BILITOT --  ALKPHOS --  ALT --  AST --  GLUCOSE 98   Lab Results  Component Value Date   CHOL 113 03/28/2012   HDL 44.90 03/28/2012   LDLCALC 53 03/28/2012   TRIG 75.0 03/28/2012   ASSESSMENT AND PLAN  1.  USA/CAD:  For PCI of the LAD today.  No c/p over the weekend.  Cont home meds - asa, plavix, bb, acei, statin.  2. HL:  Cont statin.  LDL 53 in April.  3.  Mild AS/Bicuspid Ao Valve:  F/u echo 12/2012.  Signed, Nicolasa Ducking, NP 10/02/2012, 11:06 AM

## 2012-10-02 NOTE — H&P (View-Only) (Signed)
Patient ID: Travis Armstrong, male   DOB: 01/12/1941, 71 y.o.   MRN: [id number] PCP: Dr. Wilson  71 yo with history of HTN presented initially for evaluation of chest pain in 2011.  It occurred with splitting wood and after walking about 1/4 mile on flat ground.  Given these exertional symptoms, I set him up for an ETT-myoview. This showed evidence for ischemia by exercise ECG, and there was a small reversible apical perfusion defect possibly suggestive of ischemia as well.  Echo to assess for cause of systolic murmur showed a bicuspid aortic valve with preserved LV systolic function and no significant aortic stenosis.  I set him up for a left heart cath in 1/12, which showed heavy calcification in the LAD.  There was a long 90% mid LAD stenosis.  The LAD was small at this point, a 2-2.5 mm vessel.  I decided to proceed with medical treatment as his symptoms were only with moderate exertion and as the LAD was a small caliber vessel at the site of the stenosis.  Given the bicuspid aortic valve, I did an Travis angiogram of the thoracic aorta, which ruled out aortic aneurysm.    Travis Armstrong initially did very well on low dose metoprolol with no further chest discomfort. However, last fall, he noted episodes of mild substernal chest tightness.  Sometimes this was with exertion and sometimes it was at rest.  I had him do an ETT-myoview: he had good exercise tolerance but developed mild chest discomfort.  There were significant ECG changes.  There was a small area of apical ischemia on myoview images that was similar to the prior myoview done before his cath.  I repeated a cath, this showed worsening of the mid LAD stenosis up to 95%.  He underwent PCI with Promus DES to the mid LAD in 12/12.    After PCI, his chest pain resolved.  He participated in the Senior Games in 5/13.  However, about 3 months ago, he again began to develop episodes of exertional chest pain.  This has been worse over the last 2-3 weeks.  He will get chest  tightness after walking about 1/4 mile that will resolve with rest.  He got chest tightness Saturday when walking up a hill.  He has gotten chest tightness with yardwork and with housework like mopping.  He feels like it is actually worse now than prior to his stent last December.  No syncope/lightheadedness.  No chest pain at rest.  No prolonged chest pain.  He has not taken NTG.    ECG: NSR, slightly biphasic anteroseptal T waves  Labs (10/11): LDL 113, HDL 46, K 5.1, creatinine 1.1, LFTs normal Labs (1/12): K 5.3, creatinine 1.1 Labs (3/12): LDL 51, HDL 37 Labs (12/12): K 4.1, creatinine 0.98 Labs (4/13): LDL 53, HDL 45 Labs (9/13): LDL 54, HDL 46, LFTs normal, TSH normal, K 4.7, creatinine 0.9  Allergies (verified):  1)  ! Pcn  Past Medical History: 1. Actinic Keratosis 2. Muscle strains  3. HTN 4. CAD: Anginal-type chest pain.  ETT-myoview (1/12) showed good exercise tolerance but also inferior and V5/V6 1 mm ST depression and chest pain with exercise.  Perfusion images showed reversible apical perfusion defect.  Findings suggested ischemia.  LHC (1/12): LAD heavily calcified with 90% calcified mid LAD stenosis after D2.  The LAD was small caliber (2-2.5 mm) at this point.  Because of this and his lack of unstable symptoms, I elected to manage him medically initially.    ETT-myoview (11/12): 9'30" exercise with mild chest pain and ischemic ST changes.  There was a small reversible apical perfusion defect, similar to the 1/12 study.  LHC repeated 12/12 with 95% mid LAD stenosis after D2.  Promus DES to mid LAD.  5. Bicuspid aortic valve: Echo (1/12) showed EF 60-65%, bicuspid aortic valve with minimal stenosis (mean gradient 9 mmHg), grade II diastolic dysfunction, mild Travis, mild LAE.  Travis angiogram chest: No thoracic aortic aneurysm.   6. Carotid dopplers (1/12): No significant stenosis.  7.  Hyperlipidemia: Myalgias with Lipitor.  8.  Mild bradycardia  Family History: 2 brothers with CAD  diagnosed in their 60s.  Sister with diabetes, died of complications from this disease.   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    Current Outpatient Prescriptions  Medication Sig Dispense Refill  . aspirin 81 MG tablet Take 81 mg by mouth daily.        . clopidogrel (PLAVIX) 75 MG tablet Take 1 tablet (75 mg total) by mouth daily.  90 tablet  3  . fluticasone (FLONASE) 50 MCG/ACT nasal spray Place 2 sprays into the nose daily.        . lisinopril (PRINIVIL,ZESTRIL) 10 MG tablet Take 1 tablet (10 mg total) by mouth daily.  30 tablet  11  . loratadine (CLARITIN) 10 MG tablet Take 10 mg by mouth daily.        . magnesium gluconate (MAGONATE) 500 MG tablet Take 500 mg by mouth daily.        . metoprolol tartrate (LOPRESSOR) 25 MG tablet TAKE ONE TABLET BY MOUTH TWICE DAILY  180 tablet  6  . Multiple Vitamins-Minerals (MULTIVITAMINS THER. W/MINERALS) TABS Take 2 tablets by mouth daily.        . nitroGLYCERIN (NITROSTAT) 0.4 MG SL tablet Place 1 tablet (0.4 mg total) under the tongue every 5 (five) minutes as needed for chest pain.  25 tablet  3  . omega-3 acid ethyl esters (LOVAZA) 1 G capsule Take 1 g by mouth daily.        . pravastatin (PRAVACHOL) 40 MG tablet TAKE TWO TABLETS BY MOUTH EVERY DAY  180 tablet  6    BP 124/88  Pulse 53  Ht 5' 11" (1.803 m)  Wt 196 lb 12.8 oz (89.268 kg)  BMI 27.45 kg/m2 General:  Well developed, well nourished, in no acute distress. Neck:  Neck supple, no JVD. No masses, thyromegaly or abnormal cervical nodes. Lungs:  Clear bilaterally to auscultation and percussion. Heart:  Non-displaced PMI, chest non-tender; regular rate and rhythm, S1, S2.  2/6 early systolic crescendo-decrescendo murmur RUSB.  S2 heard clearly. +S4. Carotid upstroke normal, bilateral carotid bruits versus  radiated from the heart.  Pedals normal pulses. No edema, no varicosities. Abdomen:  Bowel sounds positive; abdomen soft and non-tender without masses, organomegaly, or hernias noted. No hepatosplenomegaly. Extremities:  No clubbing or cyanosis. Neurologic:  Alert and oriented x 3. Psych:  Normal affect.  Assessment/Plan:  AORTIC VALVE DISORDERS Bicuspid aortic valve with minimal stenosis and no associated thoracic aortic aneurysm. Will need to monitor valve probably every other year unless stenosis appears progressive.  Will plan echo 1/14.  CAROTID BRUIT  Mild stenosis only in 1/12.  Coronary artery disease  Patient has had a recurrence of anginal-type typical chest pain that has been   somewhat progressive over the last 2-3 weeks.  He has not been able to tolerate Imdur due to headache and ranolazine was too expensive.  HR already 53 on metoprolol.  I will plan on left heart cath, using radial access.  I talked to Travis Armstrong about risks/benefits of procedure today.  I worry that he has had in-stent restenosis in the small-caliber LAD.  If this is the case, I will need to consider single vessel CABG with LIMA-LAD as I think repeat PCI to the same site would be unlikely to give long-term success.  Continue ASA 81, Plavix, metoprolol, lisinopril, and statin.  HYPERLIPIDEMIA-MIXED  LDL at goal (< 70) when checked this month.  He has mild myalgias occasionally that are tolerable.   Travis Armstrong 09/25/2012 12:27 PM   

## 2012-10-02 NOTE — Progress Notes (Signed)
TR BAND REMOVAL  LOCATION:  right radial  DEFLATED PER PROTOCOL:  yes  TIME BAND OFF / DRESSING APPLIED:   1830   SITE UPON ARRIVAL:   Level 0  SITE AFTER BAND REMOVAL:  Level 0  REVERSE ALLEN'S TEST:    positive  CIRCULATION SENSATION AND MOVEMENT:  Within Normal Limits  yes  COMMENTS:  Bleed upon removal of band; manual pressure held for 10 minutes.

## 2012-10-03 ENCOUNTER — Encounter (HOSPITAL_COMMUNITY): Payer: Self-pay

## 2012-10-03 DIAGNOSIS — I2510 Atherosclerotic heart disease of native coronary artery without angina pectoris: Secondary | ICD-10-CM

## 2012-10-03 DIAGNOSIS — I209 Angina pectoris, unspecified: Secondary | ICD-10-CM | POA: Diagnosis present

## 2012-10-03 DIAGNOSIS — I359 Nonrheumatic aortic valve disorder, unspecified: Secondary | ICD-10-CM

## 2012-10-03 LAB — CBC
HCT: 34.7 % — ABNORMAL LOW (ref 39.0–52.0)
Hemoglobin: 12.5 g/dL — ABNORMAL LOW (ref 13.0–17.0)
MCH: 32.6 pg (ref 26.0–34.0)
MCV: 90.4 fL (ref 78.0–100.0)
Platelets: 115 10*3/uL — ABNORMAL LOW (ref 150–400)
RBC: 3.84 MIL/uL — ABNORMAL LOW (ref 4.22–5.81)
WBC: 7 10*3/uL (ref 4.0–10.5)

## 2012-10-03 LAB — BASIC METABOLIC PANEL
BUN: 15 mg/dL (ref 6–23)
CO2: 28 mEq/L (ref 19–32)
Chloride: 102 mEq/L (ref 96–112)
Creatinine, Ser: 1.19 mg/dL (ref 0.50–1.35)
Glucose, Bld: 100 mg/dL — ABNORMAL HIGH (ref 70–99)

## 2012-10-03 LAB — POCT ACTIVATED CLOTTING TIME: Activated Clotting Time: 429 seconds

## 2012-10-03 MED FILL — Dextrose Inj 5%: INTRAVENOUS | Qty: 50 | Status: AC

## 2012-10-03 NOTE — Progress Notes (Signed)
Patient ID: Travis Armstrong, male   DOB: Jan 15, 1941, 71 y.o.   MRN: [id number]    SUBJECTIVE: Doing well this morning, no complaints.      Marland Kitchen aspirin  324 mg Oral Pre-Cath  . aspirin  81 mg Oral Daily  . bivalirudin      . clopidogrel      . clopidogrel  75 mg Oral Daily  . fentaNYL      . heparin      . lidocaine      . lisinopril  10 mg Oral Daily  . loratadine  10 mg Oral Daily  . magnesium gluconate  500 mg Oral Daily  . metoprolol tartrate  25 mg Oral BID  . midazolam      . nitroGLYCERIN      . omega-3 acid ethyl esters  1 g Oral Daily  . pravastatin  80 mg Oral QHS  . verapamil      . DISCONTD: aspirin  81 mg Oral Daily  . DISCONTD: atorvastatin  80 mg Oral q1800  . DISCONTD: clopidogrel  75 mg Oral Pre-Cath  . DISCONTD: NON FORMULARY 80 mg  80 mg Oral QHS  . DISCONTD: sodium chloride  3 mL Intravenous Q12H      Filed Vitals:   10/02/12 1944 10/02/12 2232 10/02/12 2330 10/03/12 0534  BP:  96/49  105/50  Pulse:  51  56  Temp: 97.7 F (36.5 C)  97.7 F (36.5 C) 97.7 F (36.5 C)  TempSrc: Oral   Oral  Resp:    13  Height: 5\' 11"  (1.803 m)     Weight:    196 lb 13.9 oz (89.3 kg)  SpO2:    99%    Intake/Output Summary (Last 24 hours) at 10/03/12 0737 Last data filed at 10/03/12 0700  Gross per 24 hour  Intake 2043.33 ml  Output   3450 ml  Net -1406.67 ml    LABS: Basic Metabolic Panel:  Basename 10/03/12 0500 10/02/12 0857  NA 136 136  K 4.3 4.3  CL 102 101  CO2 28 29  GLUCOSE 100* 98  BUN 15 14  CREATININE 1.19 1.11  CALCIUM 9.1 9.3  MG -- --  PHOS -- --   Liver Function Tests: No results found for this basename: AST:2,ALT:2,ALKPHOS:2,BILITOT:2,PROT:2,ALBUMIN:2 in the last 72 hours No results found for this basename: LIPASE:2,AMYLASE:2 in the last 72 hours CBC:  Basename 10/03/12 0500 10/02/12 0857  WBC 7.0 5.8  NEUTROABS -- --  HGB 12.5* 13.7  HCT 34.7* 38.8*  MCV 90.4 91.1  PLT PENDING 128*   Cardiac Enzymes: No results found for  this basename: CKTOTAL:3,CKMB:3,CKMBINDEX:3,TROPONINI:3 in the last 72 hours BNP: No components found with this basename: POCBNP:3 D-Dimer: No results found for this basename: DDIMER:2 in the last 72 hours Hemoglobin A1C: No results found for this basename: HGBA1C in the last 72 hours Fasting Lipid Panel: No results found for this basename: CHOL,HDL,LDLCALC,TRIG,CHOLHDL,LDLDIRECT in the last 72 hours Thyroid Function Tests: No results found for this basename: TSH,T4TOTAL,FREET3,T3FREE,THYROIDAB in the last 72 hours Anemia Panel: No results found for this basename: VITAMINB12,FOLATE,FERRITIN,TIBC,IRON,RETICCTPCT in the last 72 hours  RADIOLOGY: No results found.  PHYSICAL EXAM General: NAD Neck: No JVD, no thyromegaly or thyroid nodule.  Lungs: Clear to auscultation bilaterally with normal respiratory effort. CV: Nondisplaced PMI.  Heart regular S1/S2, no S3/S4, no murmur.  No peripheral edema.  No carotid bruit.  Normal pedal pulses.  Abdomen: Soft, nontender, no hepatosplenomegaly, no distention.  Neurologic:  Alert and oriented x 3.  Psych: Normal affect. Extremities: No clubbing or cyanosis. Right radial cath site benign.  ASSESSMENT AND PLAN: 71 yo with history of CAD now s/p PCI to LAD.  No complications, doing well.  He will need to continue DAPT long-term.  Good P2Y12 test result last Friday on Plavix.  He will continue his current meds as prior to hospitalization.  He has followup with me in 2-3 weeks.  When he follows up, he should come fasting as I want to draw a Lipomed panel.   Travis Armstrong 10/03/2012 7:39 AM

## 2012-10-03 NOTE — Progress Notes (Signed)
Pt's home med of pravastatin 80mg  not ordered.  Hurman Horn PA notified and order received.

## 2012-10-03 NOTE — Discharge Summary (Signed)
CARDIOLOGY DISCHARGE SUMMARY   Patient ID: Travis Armstrong MRN: [id number] DOB/AGE: 05/28/1941 71 y.o.  Admit date: 10/02/2012 Discharge date: 10/03/2012  Primary Discharge Diagnosis:  USAP/progressive exertional angina (class III) with evidence of re-narrowing proximal to prior stent - s/p 2.25 by 16 mm Promus Element drug-eluting stent to the LAD  Secondary Discharge Diagnosis:   HYPERLIPIDEMIA-MIXED  Procedures:  PTCA and stenting of the LAD  Hospital Course:  Travis Armstrong is a 71 year Armstrong male with a history of coronary artery disease. He was evaluated as an outpatient and his symptoms were concerning for progressive angina. He had a cardiac catheterization in the [facility] lab which showed a tight LAD lesion. A P2y12 test showed good platelet inhibition from Plavix. He was brought back to the hospital on 10/02/2012 for percutaneous intervention to the LAD.  He had the procedure described above with successful insertion of a stent to the LAD. He tolerated the procedure well. On 10/03/2012, he was seen by cardiac rehabilitation and by Dr. Shirlee Latch. Dr. Shirlee Latch reviewed his last lipid profile and recommended continuing his current medical therapy with a Lipomed profile performed as an outpatient. He was ambulating without chest pain or shortness of breath and considered stable for discharge, to follow up as an outpatient.  Labs:   Lab Results  Component Value Date   WBC 7.0 10/03/2012   HGB 12.5* 10/03/2012   HCT 34.7* 10/03/2012   MCV 90.4 10/03/2012   PLT 115* 10/03/2012    Lab 10/03/12 0500  NA 136  K 4.3  CL 102  CO2 28  BUN 15  CREATININE 1.19  CALCIUM 9.1  PROT --  BILITOT --  ALKPHOS --  ALT --  AST --  GLUCOSE 100*   Lab Results  Component Value Date   CHOL 113 03/28/2012   HDL 44.90 03/28/2012   LDLCALC 53 03/28/2012   TRIG 75.0 03/28/2012   CHOLHDL 3 03/28/2012      Cardiac Cath:  10/02/2012 Lesion Data:  Vessel: LAD  Percent stenosis (pre): 99%  TIMI-flow (pre): 2    Stent: 2.25 by 16 mm Promus Element---post dilated to 2.35mm [place] Sprinter to 18 atm  Percent stenosis (post): <10  TIMI-flow (post): 3  Conclusions:  1. Successful PCI of the LAD using DES proximal to prior treatment site.  EKG:  03-Oct-2012 05:42:59 [facility] ROUTINE RECORD Sinus bradycardia Otherwise normal ECG 52mm/s 50mm/mV 100Hz  8.0.1 12SL 241 HD CID: 1 Referred by: Herby Abraham Unconfirmed Vent. rate 57 BPM PR interval 150 ms QRS duration 88 ms QT/QTc 446/434 ms P-R-T axes 62 69 64  FOLLOW UP PLANS AND APPOINTMENTS Allergies  Allergen Reactions  . Penicillins     RASH ON FEET     Medication List     As of 10/03/2012 10:57 AM    TAKE these medications         aspirin 81 MG tablet   Take 81 mg by mouth daily.      clopidogrel 75 MG tablet   Commonly known as: PLAVIX   Take 1 tablet (75 mg total) by mouth daily.      fluticasone 50 MCG/ACT nasal spray   Commonly known as: FLONASE   Place 2 sprays into the nose daily.      lisinopril 10 MG tablet   Commonly known as: PRINIVIL,ZESTRIL   Take 1 tablet (10 mg total) by mouth daily.      loratadine 10 MG tablet   Commonly known as: CLARITIN  Take 10 mg by mouth daily.      magnesium gluconate 500 MG tablet   Commonly known as: MAGONATE   Take 500 mg by mouth daily.      metoprolol tartrate 25 MG tablet   Commonly known as: LOPRESSOR   Take 25 mg by mouth 2 (two) times daily.      multivitamins ther. w/minerals Tabs   Take 2 tablets by mouth daily.      nitroGLYCERIN 0.4 MG SL tablet   Commonly known as: NITROSTAT   Place 1 tablet (0.4 mg total) under the tongue every 5 (five) minutes as needed for chest pain.      omega-3 acid ethyl esters 1 G capsule   Commonly known as: LOVAZA   Take 1 g by mouth daily.      pravastatin 40 MG tablet   Commonly known as: PRAVACHOL   Take 80 mg by mouth daily.            Discharge Orders    Future Appointments: Provider: Department:  Dept Phone: Center:   10/25/2012 8:45 AM Laurey Morale, MD [facility] [phone] [facility]   10/25/2012 9:00 AM [facility] [place] [phone] [facility]     Future Orders Please Complete By Expires   Diet - low sodium heart healthy      Increase activity slowly        Follow-up Information    Follow up with [name], MD. On 10/25/2012. (at 8:30 for bloodwork, 8:45 for MD.)    Contact information:   [address] [place] [address] [phone]          BRING ALL MEDICATIONS WITH YOU TO FOLLOW UP APPOINTMENTS  Time spent with patient to include physician time: 38 min Signed:Rishaan Gunner 10/03/2012, 10:57 AM Co-Sign MD

## 2012-10-03 NOTE — Progress Notes (Signed)
CARDIAC REHAB PHASE I   PRE:  Rate/Rhythm: 60 SR    BP: sitting 129/65    SaO2:   MODE:  Ambulation: 500 ft   POST:  Rate/Rhythm: 72 SR    BP: sitting 140/61     SaO2:   Tolerated well. Reviewed ed. Not interested in CRPII (just did in January).  [id number]  Harriet Masson CES, ACSM

## 2012-10-05 ENCOUNTER — Telehealth: Payer: Self-pay | Admitting: Cardiology

## 2012-10-05 NOTE — Telephone Encounter (Signed)
Pt has questions regarding what Dr. Shirlee Latch had advised him to so and he wants to talk to someone and make sure he is not over doing it

## 2012-10-05 NOTE — Telephone Encounter (Signed)
Pt calling today regarding how much walking he can do since post stent placement this week. Encouraged pt to increase activity slowly gradually building back up to his 2 mile per day walks. Otherwise he is doing well without any cardiac complaints per pt.  Reminded pt of fasting lab appt & ov with Dr. Shirlee Latch.

## 2012-10-12 ENCOUNTER — Telehealth: Payer: Self-pay | Admitting: Cardiology

## 2012-10-12 NOTE — Telephone Encounter (Signed)
2 weeks after cath (10/28) may resume full activities.

## 2012-10-12 NOTE — Telephone Encounter (Signed)
Pt advised, he verbalized understanding. 

## 2012-10-12 NOTE — Telephone Encounter (Signed)
**Note De-Identified Travis Armstrong Obfuscation** Pt. is advised that he may mow his yard as he uses a riding mower but not to golf or bowl until he hears back from Dr. Alford Highland nurse. Please advise.

## 2012-10-12 NOTE — Telephone Encounter (Signed)
New problem:  Wants to know can he start doing physical activities. Golfing, mowing grass, bowling,

## 2012-10-14 NOTE — H&P (Signed)
Pt evaluated and case discussed in detail with Dr. Shirlee Latch, patient and wife.  I have seen, examined and personally reviewed all of the findings.  He wishes to proceed with PCI today.  Risks reviewed.  TS

## 2012-10-18 ENCOUNTER — Other Ambulatory Visit: Payer: Self-pay | Admitting: *Deleted

## 2012-10-18 DIAGNOSIS — I2510 Atherosclerotic heart disease of native coronary artery without angina pectoris: Secondary | ICD-10-CM

## 2012-10-18 DIAGNOSIS — E785 Hyperlipidemia, unspecified: Secondary | ICD-10-CM

## 2012-10-18 DIAGNOSIS — I359 Nonrheumatic aortic valve disorder, unspecified: Secondary | ICD-10-CM

## 2012-10-25 ENCOUNTER — Ambulatory Visit (INDEPENDENT_AMBULATORY_CARE_PROVIDER_SITE_OTHER): Payer: Medicare Other | Admitting: Cardiology

## 2012-10-25 ENCOUNTER — Other Ambulatory Visit (INDEPENDENT_AMBULATORY_CARE_PROVIDER_SITE_OTHER): Payer: Medicare Other

## 2012-10-25 ENCOUNTER — Other Ambulatory Visit: Payer: Self-pay | Admitting: *Deleted

## 2012-10-25 ENCOUNTER — Encounter: Payer: Self-pay | Admitting: Cardiology

## 2012-10-25 VITALS — BP 132/58 | HR 48 | Ht 73.0 in | Wt 198.0 lb

## 2012-10-25 DIAGNOSIS — E785 Hyperlipidemia, unspecified: Secondary | ICD-10-CM

## 2012-10-25 DIAGNOSIS — I359 Nonrheumatic aortic valve disorder, unspecified: Secondary | ICD-10-CM

## 2012-10-25 DIAGNOSIS — E7800 Pure hypercholesterolemia, unspecified: Secondary | ICD-10-CM

## 2012-10-25 DIAGNOSIS — I2510 Atherosclerotic heart disease of native coronary artery without angina pectoris: Secondary | ICD-10-CM

## 2012-10-25 DIAGNOSIS — R0989 Other specified symptoms and signs involving the circulatory and respiratory systems: Secondary | ICD-10-CM

## 2012-10-25 LAB — CBC WITH DIFFERENTIAL/PLATELET
Basophils Relative: 0.4 % (ref 0.0–3.0)
Eosinophils Absolute: 0.1 10*3/uL (ref 0.0–0.7)
MCHC: 33.5 g/dL (ref 30.0–36.0)
MCV: 95.5 fl (ref 78.0–100.0)
Monocytes Absolute: 0.6 10*3/uL (ref 0.1–1.0)
Neutro Abs: 3.7 10*3/uL (ref 1.4–7.7)
Neutrophils Relative %: 63.4 % (ref 43.0–77.0)
RBC: 4.16 Mil/uL — ABNORMAL LOW (ref 4.22–5.81)
RDW: 13.9 % (ref 11.5–14.6)

## 2012-10-25 LAB — BASIC METABOLIC PANEL
CO2: 28 mEq/L (ref 19–32)
Chloride: 102 mEq/L (ref 96–112)
Creatinine, Ser: 1.2 mg/dL (ref 0.4–1.5)
Glucose, Bld: 92 mg/dL (ref 70–99)

## 2012-10-25 MED ORDER — ATORVASTATIN CALCIUM 40 MG PO TABS: 40.0000 mg | ORAL_TABLET | Freq: Every day | ORAL | Status: DC

## 2012-10-25 NOTE — Progress Notes (Signed)
Patient ID: Travis Armstrong, male   DOB: 08-Apr-1941, 71 y.o.   MRN: [id number] PCP: Dr. Andrey Campanile  71 yo with history of HTN presented initially for evaluation of chest pain in 2011.  It occurred with splitting wood and after walking about 1/4 mile on flat ground.  Given these exertional symptoms, I set him up for an ETT-myoview. This showed evidence for ischemia by exercise ECG, and there was a small reversible apical perfusion defect possibly suggestive of ischemia as well.  Echo to assess for cause of systolic murmur showed a bicuspid aortic valve with preserved LV systolic function and no significant aortic stenosis.  I set him up for a left heart cath in 1/12, which showed heavy calcification in the LAD.  There was a long 90% mid LAD stenosis.  The LAD was small at this point, a 2-2.5 mm vessel.  I decided to proceed with medical treatment as his symptoms were only with moderate exertion and as the LAD was a small caliber vessel at the site of the stenosis.  Given the bicuspid aortic valve, I did an Travis angiogram of the thoracic aorta, which ruled out aortic aneurysm.    Travis Armstrong initially did very well on low dose metoprolol with no further chest discomfort. However, last fall, he noted episodes of mild substernal chest tightness.  Sometimes this was with exertion and sometimes it was at rest.  I had him do an ETT-myoview: he had good exercise tolerance but developed mild chest discomfort.  There were significant ECG changes.  There was a small area of apical ischemia on myoview images that was similar to the prior myoview done before his cath.  I repeated a cath, this showed worsening of the mid LAD stenosis up to 95%.  He underwent PCI with Promus DES to the mid LAD in 12/12.    Prior to last appointment, he again started to develop exertional chest pain.  I did a left heart cath in 10/13 showing 99% stenosis in the proximal LAD just proximal to the stent.  He had a Promus DES placed to the proximal LAD.  Since  the procedure, he has had no further exertional chest pain.  He is back to golfing, bowling, and walking.  No exertional dyspnea.  He does feel like he tires more easily than in the past.  HR has been running in the 50s when at rest at home.    ECG: NSR, slightly biphasic anteroseptal T waves  Labs (10/11): LDL 113, HDL 46, K 5.1, creatinine 1.1, LFTs normal Labs (1/12): K 5.3, creatinine 1.1 Labs (3/12): LDL 51, HDL 37 Labs (12/12): K 4.1, creatinine 0.98 Labs (4/13): LDL 53, HDL 45 Labs (9/13): LDL 54, HDL 46, LFTs normal, TSH normal, K 4.7, creatinine 0.9  Allergies (verified):  1)  ! Pcn  Past Medical History: 1. Actinic Keratosis 2. Muscle strains  3. HTN 4. CAD: Anginal-type chest pain.  ETT-myoview (1/12) showed good exercise tolerance but also inferior and V5/V6 1 mm ST depression and chest pain with exercise.  Perfusion images showed reversible apical perfusion defect.  Findings suggested ischemia.  LHC (1/12): LAD heavily calcified with 90% calcified mid LAD stenosis after D2.  The LAD was small caliber (2-2.5 mm) at this point.  Because of this and his lack of unstable symptoms, I elected to manage him medically initially.  ETT-myoview (11/12): 9'30" exercise with mild chest pain and ischemic ST changes.  There was a small reversible apical perfusion defect, similar  to the 1/12 study.  LHC repeated 12/12 with 95% mid LAD stenosis after D2.  Promus DES to mid LAD.  Exertional chest pain recurred with LHC done again in 10/13.  There was 99% proximal LAD stenosis just proximal to the stent.  Patient received a Promus DES.   5. Bicuspid aortic valve: Echo (1/12) showed EF 60-65%, bicuspid aortic valve with minimal stenosis (mean gradient 9 mmHg), grade II diastolic dysfunction, mild Travis, mild LAE.  Travis angiogram chest: No thoracic aortic aneurysm.   6. Carotid dopplers (1/12): No significant stenosis.  7.  Hyperlipidemia: Myalgias with Lipitor.  8.  Mild bradycardia  Family History: 2  brothers with CAD diagnosed in their 59s.  Sister with diabetes, died of complications from this disease.   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    Current Outpatient Prescriptions  Medication Sig Dispense Refill  . aspirin 81 MG tablet Take 81 mg by mouth daily.        . clopidogrel (PLAVIX) 75 MG tablet Take 1 tablet (75 mg total) by mouth daily.  90 tablet  3  . fluticasone (FLONASE) 50 MCG/ACT nasal spray Place 2 sprays into the nose daily.        Marland Kitchen lisinopril (PRINIVIL,ZESTRIL) 10 MG tablet Take 1 tablet (10 mg total) by mouth daily.  30 tablet  11  . loratadine (CLARITIN) 10 MG tablet Take 10 mg by mouth daily.        . magnesium gluconate (MAGONATE) 500 MG tablet Take 500 mg by mouth daily.        . metoprolol tartrate (LOPRESSOR) 25 MG tablet Take 25 mg by mouth 2 (two) times daily.      . Multiple Vitamins-Minerals (MULTIVITAMINS THER. W/MINERALS) TABS Take 2 tablets by mouth daily.        . nitroGLYCERIN (NITROSTAT) 0.4 MG SL tablet Place 1 tablet (0.4 mg total) under the tongue every 5 (five) minutes as needed for chest pain.  25 tablet  3  . omega-3 acid ethyl esters (LOVAZA) 1 G capsule Take 1 g by mouth daily.        Marland Kitchen atorvastatin (LIPITOR) 40 MG tablet Take 1 tablet (40 mg total) by mouth daily.  90 tablet  3    BP 132/58  Pulse 48  Ht 6\' 1"  (1.854 m)  Wt 198 lb (89.812 kg)  BMI 26.12 kg/m2 General:  Well developed, well nourished, in no acute distress. Neck:  Neck supple, no JVD. No masses, thyromegaly or abnormal cervical nodes. Lungs:  Clear bilaterally to auscultation and percussion. Heart:  Non-displaced PMI, chest non-tender; regular rate and rhythm, S1, S2.  2/6 early systolic crescendo-decrescendo murmur RUSB.  S2 heard clearly. +S4. Carotid upstroke normal, bilateral carotid bruits versus  radiated from the heart.  Pedals normal pulses. No edema, no varicosities. Abdomen:  Bowel sounds positive; abdomen soft and non-tender without masses, organomegaly, or hernias noted. No hepatosplenomegaly. Extremities:  No clubbing or cyanosis. Neurologic:  Alert and oriented x 3. Psych:  Normal affect.  Assessment/Plan:  AORTIC VALVE DISORDERS Bicuspid aortic valve with minimal stenosis and no associated thoracic aortic aneurysm. Will need to monitor valve probably every other year unless stenosis appears progressive.  Will plan echo 1/14.  CAROTID BRUIT  Mild stenosis only in 1/12.  Coronary artery disease  Status post PCI with  DES to proximal LAD in 10/13.  He has been doing well since procedure with no exertional chest pain.  He is back to his prior activities without problem.  Conintue ASA 81, Plavix 75 (long-term), ACEI, metoprolol, and statin.  HYPERLIPIDEMIA-MIXED  Given rapid progression of CAD, I am going to change him to a more potent statin.  Will use atorvastatin 40 mg daily rather than pravastatin.  Lipids/LFTs in 2 months.   [name] 10/25/2012 1:38 PM

## 2012-10-25 NOTE — Patient Instructions (Addendum)
Take atorvastatin 40mg  daily instead of pravastatin. Start atorvastatin when you finish your current supply of pravastatin.   Your physician recommends that you return for a FASTING lipid profile /liver profile 2 months after you have been taking atorvastatin.   Your physician recommends that you have lab work today--BMET/CBCd     Your physician has requested that you have an echocardiogram. Echocardiography is a painless test that uses sound waves to create images of your heart. It provides your doctor with information about the size and shape of your heart and how well your heart's chambers and valves are working. This procedure takes approximately one hour. There are no restrictions for this procedure. January 2014  Your physician recommends that you schedule a follow-up appointment in: 4 months with Dr Shirlee Latch.

## 2012-12-08 ENCOUNTER — Telehealth: Payer: Self-pay | Admitting: Cardiology

## 2012-12-08 MED ORDER — CLOPIDOGREL BISULFATE 75 MG PO TABS: 75.0000 mg | ORAL_TABLET | Freq: Every day | ORAL | Status: DC

## 2012-12-08 NOTE — Telephone Encounter (Signed)
[phone] [facility] for [facility]. Pt requesting prescription for Plavix 75mg  90 days be faxed to [place]. This will be done today.

## 2012-12-08 NOTE — Telephone Encounter (Signed)
Pt needs to talk to you about getting his medication ordered from [place] they need autherization if you call soon plz use number listed above but he will be at [phone] in about 

## 2012-12-11 ENCOUNTER — Telehealth: Payer: Self-pay | Admitting: Cardiology

## 2012-12-11 NOTE — Telephone Encounter (Signed)
Pt was told to avoid decongestants.

## 2012-12-11 NOTE — Telephone Encounter (Signed)
Can pt take Alka selzer cold medicine for a chest cold, if not what can he take over the counter?  pls call

## 2012-12-11 NOTE — Telephone Encounter (Signed)
N/A.  LMTC. 

## 2012-12-25 ENCOUNTER — Ambulatory Visit (HOSPITAL_COMMUNITY): Payer: Medicare Other | Attending: Cardiology | Admitting: Radiology

## 2012-12-25 DIAGNOSIS — I080 Rheumatic disorders of both mitral and aortic valves: Secondary | ICD-10-CM | POA: Insufficient documentation

## 2012-12-25 DIAGNOSIS — I10 Essential (primary) hypertension: Secondary | ICD-10-CM | POA: Insufficient documentation

## 2012-12-25 DIAGNOSIS — I369 Nonrheumatic tricuspid valve disorder, unspecified: Secondary | ICD-10-CM | POA: Insufficient documentation

## 2012-12-25 DIAGNOSIS — I2510 Atherosclerotic heart disease of native coronary artery without angina pectoris: Secondary | ICD-10-CM

## 2012-12-25 DIAGNOSIS — I359 Nonrheumatic aortic valve disorder, unspecified: Secondary | ICD-10-CM

## 2012-12-25 DIAGNOSIS — E7800 Pure hypercholesterolemia, unspecified: Secondary | ICD-10-CM

## 2012-12-25 NOTE — Progress Notes (Signed)
Echocardiogram performed.  

## 2013-02-26 ENCOUNTER — Encounter: Payer: Self-pay | Admitting: Cardiology

## 2013-02-26 ENCOUNTER — Ambulatory Visit (INDEPENDENT_AMBULATORY_CARE_PROVIDER_SITE_OTHER): Payer: Medicare Other | Admitting: Cardiology

## 2013-02-26 ENCOUNTER — Other Ambulatory Visit: Payer: Medicare Other

## 2013-02-26 VITALS — BP 130/60 | HR 56 | Ht 73.0 in | Wt 201.0 lb

## 2013-02-26 DIAGNOSIS — I359 Nonrheumatic aortic valve disorder, unspecified: Secondary | ICD-10-CM

## 2013-02-26 DIAGNOSIS — I2510 Atherosclerotic heart disease of native coronary artery without angina pectoris: Secondary | ICD-10-CM

## 2013-02-26 DIAGNOSIS — E785 Hyperlipidemia, unspecified: Secondary | ICD-10-CM

## 2013-02-26 LAB — HEPATIC FUNCTION PANEL
AST: 27 U/L (ref 0–37)
Alkaline Phosphatase: 70 U/L (ref 39–117)
Total Bilirubin: 0.8 mg/dL (ref 0.3–1.2)

## 2013-02-26 LAB — BASIC METABOLIC PANEL
BUN: 14 mg/dL (ref 6–23)
CO2: 28 mEq/L (ref 19–32)
Calcium: 9.2 mg/dL (ref 8.4–10.5)
Creatinine, Ser: 1.1 mg/dL (ref 0.4–1.5)
Glucose, Bld: 101 mg/dL — ABNORMAL HIGH (ref 70–99)

## 2013-02-26 LAB — LIPID PANEL
Total CHOL/HDL Ratio: 2
Triglycerides: 39 mg/dL (ref 0.0–149.0)

## 2013-02-26 NOTE — Progress Notes (Signed)
Patient ID: Travis Armstrong, male   DOB: September 18, 1941, 72 y.o.   MRN: [id number] PCP: Dr. Andrey Campanile  72 yo with history of HTN presented initially for evaluation of chest pain in 2011.  It occurred with splitting wood and after walking about 1/4 mile on flat ground.  Given these exertional symptoms, I set him up for an ETT-myoview. This showed evidence for ischemia by exercise ECG, and there was a small reversible apical perfusion defect possibly suggestive of ischemia as well.  Echo to assess for cause of systolic murmur showed a bicuspid aortic valve with preserved LV systolic function and no significant aortic stenosis.  I set him up for a left heart cath in 1/12, which showed heavy calcification in the LAD.  There was a long 90% mid LAD stenosis.  The LAD was small at this point, a 2-2.5 mm vessel.  I decided to proceed with medical treatment as his symptoms were only with moderate exertion and as the LAD was a small caliber vessel at the site of the stenosis.  Given the bicuspid aortic valve, I did an MR angiogram of the thoracic aorta, which ruled out aortic aneurysm.    In th fall of 2012, he had recurrent chest pain and I repeated a cath, this showed worsening of the mid LAD stenosis up to 95%.  He underwent PCI with Promus DES to the mid LAD in 12/12.  In the fall of 2013, he again developed exertional chest pain.  I did a left heart cath in 10/13 showing 99% stenosis in the proximal LAD just proximal to the stent.  He had a Promus DES placed to the proximal LAD.  Since the procedure, he has had no further exertional chest pain.  About a month ago, he developed substernal chest pain while sitting in a chair.  This lasted about 5 minutes and he has had no recurrence.  He is back to golfing, bowling, and walking.  He shoveled snow last week with no problems.  No exertional dyspnea.  He does feel like he tires more easily than in the past in general.   Last echo in 1/14 showed mild to moderate aortic stenosis.     ECG: NSR, slightly biphasic anteroseptal T waves  Labs (10/11): LDL 113, HDL 46, K 5.1, creatinine 1.1, LFTs normal Labs (1/12): K 5.3, creatinine 1.1 Labs (3/12): LDL 51, HDL 37 Labs (12/12): K 4.1, creatinine 0.98 Labs (4/13): LDL 53, HDL 45 Labs (9/13): LDL 54, HDL 46, LFTs normal, TSH normal, K 4.7, creatinine 0.9  Allergies (verified):  1)  ! Pcn  Past Medical History: 1. Actinic Keratosis 2. Muscle strains  3. HTN 4. CAD: Anginal-type chest pain.  ETT-myoview (1/12) showed good exercise tolerance but also inferior and V5/V6 1 mm ST depression and chest pain with exercise.  Perfusion images showed reversible apical perfusion defect.  Findings suggested ischemia.  LHC (1/12): LAD heavily calcified with 90% calcified mid LAD stenosis after D2.  The LAD was small caliber (2-2.5 mm) at this point.  Because of this and his lack of unstable symptoms, I elected to manage him medically initially.  ETT-myoview (11/12): 9'30" exercise with mild chest pain and ischemic ST changes.  There was a small reversible apical perfusion defect, similar to the 1/12 study.  LHC repeated 12/12 with 95% mid LAD stenosis after D2.  Promus DES to mid LAD.  Exertional chest pain recurred with LHC done again in 10/13.  There was 99% proximal LAD stenosis just  proximal to the stent.  Patient received a Promus DES.   5. Bicuspid aortic valve with mild-moderate AS: Echo (1/12) showed EF 60-65%, bicuspid aortic valve with minimal stenosis (mean gradient 9 mmHg), grade II diastolic dysfunction, mild MR, mild LAE.  MR angiogram chest: No thoracic aortic aneurysm.  Echo (1/14) showed EF 65-70% with mild to moderate AS, mean gradient 22 mmHg.  6. Carotid dopplers (1/12): No significant stenosis.  7.  Hyperlipidemia: Myalgias with Lipitor.  8.  Mild bradycardia  Family History: 2 brothers with CAD diagnosed in their 14s.  Sister with diabetes, died of complications from this disease.   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 aspirin 81 MG tablet Take 81 mg by mouth daily.        Marland Kitchen atorvastatin (LIPITOR) 40 MG tablet Take 1 tablet (40 mg total) by mouth daily.  90 tablet  3  . clopidogrel (PLAVIX) 75 MG tablet Take 1 tablet (75 mg total) by mouth daily.  90 tablet  3  . fluticasone (FLONASE) 50 MCG/ACT nasal spray Place 2 sprays into the nose daily.        Marland Kitchen lisinopril (PRINIVIL,ZESTRIL) 10 MG tablet Take 1 tablet (10 mg total) by mouth daily.  30 tablet  11  . loratadine (CLARITIN) 10 MG tablet Take 10 mg by mouth daily.        . magnesium gluconate (MAGONATE) 500 MG tablet Take 500 mg by mouth daily.        . metoprolol tartrate (LOPRESSOR) 25 MG tablet Take 25 mg by mouth 2 (two) times daily.      . nitroGLYCERIN (NITROSTAT) 0.4 MG SL tablet Place 1 tablet (0.4 mg total) under the tongue every 5 (five) minutes as needed for chest pain.  25 tablet  3   No current facility-administered medications for this visit.    BP 130/60  Pulse 56  Ht 6\' 1"  (1.854 m)  Wt 201 lb (91.173 kg)  BMI 26.52 kg/m2 General:  Well developed, well nourished, in no acute distress. Neck:  Neck supple, no JVD. No masses, thyromegaly or abnormal cervical nodes. Lungs:  Clear bilaterally to auscultation and percussion. Heart:  Non-displaced PMI, chest non-tender; regular rate and rhythm, S1, S2.  2/6 early systolic crescendo-decrescendo murmur RUSB.  S2 heard clearly. +S4. Carotid upstroke normal, bilateral carotid bruits versus radiated from the heart.  Pedals normal pulses. No edema, no varicosities. Abdomen:  Bowel sounds positive; abdomen soft and non-tender without masses, organomegaly, or hernias noted. No hepatosplenomegaly. Extremities:  No clubbing or cyanosis. Neurologic:  Alert and oriented x 3. Psych:  Normal  affect.  Assessment/Plan:  AORTIC VALVE DISORDERS Bicuspid aortic valve with mild to moderate AS and no associated thoracic aortic aneurysm.  Unless worrisome symptoms develop, will repeat echo in 1/16.   CAROTID BRUIT  Mild stenosis only in 1/12.  Coronary artery disease  Status post PCI with DES to proximal LAD in 10/13.  He has been doing well since procedure with no exertional chest pain.  He is back to his prior activities without problem.  He had one episode of chest pain at rest x 5 minutes about a month ago.  Given lack of exertional symptoms, suspect this was noncardiac.  Continue ASA 81, Plavix 75 (long-term), ACEI, metoprolol, and statin.  HYPERLIPIDEMIA-MIXED  Check lipids/LFTs today (started  atorvastatin a couple of months ago).    [name] 02/26/2013 9:58 AM

## 2013-02-26 NOTE — Patient Instructions (Addendum)
Your physician recommends that you have  a FASTING lipid profile /liver profile /BMET today.  Your physician wants you to follow-up in:  (September 2014). You will receive a reminder letter in the mail two months in advance. If you don't receive a letter, please call our office to schedule the follow-up appointment.

## 2013-09-26 ENCOUNTER — Ambulatory Visit (INDEPENDENT_AMBULATORY_CARE_PROVIDER_SITE_OTHER): Payer: Medicare Other | Admitting: Cardiology

## 2013-09-26 ENCOUNTER — Encounter: Payer: Self-pay | Admitting: Cardiology

## 2013-09-26 VITALS — BP 124/64 | HR 60 | Ht 71.0 in | Wt 203.0 lb

## 2013-09-26 DIAGNOSIS — I2510 Atherosclerotic heart disease of native coronary artery without angina pectoris: Secondary | ICD-10-CM

## 2013-09-26 DIAGNOSIS — I359 Nonrheumatic aortic valve disorder, unspecified: Secondary | ICD-10-CM

## 2013-09-26 DIAGNOSIS — R0989 Other specified symptoms and signs involving the circulatory and respiratory systems: Secondary | ICD-10-CM

## 2013-09-26 DIAGNOSIS — I350 Nonrheumatic aortic (valve) stenosis: Secondary | ICD-10-CM

## 2013-09-26 DIAGNOSIS — E785 Hyperlipidemia, unspecified: Secondary | ICD-10-CM

## 2013-09-26 NOTE — Patient Instructions (Addendum)
Your physician has requested that you have a carotid duplex. This test is an ultrasound of the carotid arteries in your neck. It looks at blood flow through these arteries that supply the brain with blood. Allow one hour for this exam. There are no restrictions or special instructions.  Your physician has requested that you have an echocardiogram. Echocardiography is a painless test that uses sound waves to create images of your heart. It provides your doctor with information about the size and shape of your heart and how well your heart's chambers and valves are working. This procedure takes approximately one hour. There are no restrictions for this procedure. January 2015  Your physician wants you to follow-up in: 6 months with Dr Shirlee Latch. (April 2015). You will receive a reminder letter in the mail two months in advance. If you don't receive a letter, please call our office to schedule the follow-up appointment. .[email or web address]

## 2013-09-27 NOTE — Progress Notes (Signed)
Patient ID: Travis Armstrong, male   DOB: 02-Feb-1941, 72 y.o.   MRN: [id number] PCP: Dr. Andrey Campanile  72 yo with history of HTN presented initially for evaluation of chest pain in 2011.  It occurred with splitting wood and after walking about 1/4 mile on flat ground.  Given these exertional symptoms, I set him up for an ETT-myoview. This showed evidence for ischemia by exercise ECG, and there was a small reversible apical perfusion defect possibly suggestive of ischemia as well.  Echo to assess for cause of systolic murmur showed a bicuspid aortic valve with preserved LV systolic function and no significant aortic stenosis.  I set him up for a left heart cath in 1/12, which showed heavy calcification in the LAD.  There was a long 90% mid LAD stenosis.  The LAD was small at this point, a 2-2.5 mm vessel.  I decided to proceed with medical treatment as his symptoms were only with moderate exertion and as the LAD was a small caliber vessel at the site of the stenosis.  Given the bicuspid aortic valve, I did an MR angiogram of the thoracic aorta, which ruled out aortic aneurysm.  In the fall of 2012, he had recurrent chest pain and I repeated a cath, this showed worsening of the mid LAD stenosis up to 95%.  He underwent PCI with Promus DES to the mid LAD in 12/12.  In the fall of 2013, he again developed exertional chest pain.  I did a left heart cath in 10/13 showing 99% stenosis in the proximal LAD just proximal to the stent.  He had a Promus DES placed to the proximal LAD.    Since the procedure, he has had no further exertional chest pain.  He is golfing, bowling, and walking.   No exertional dyspnea.  He does feel like he tires more easily than in the past in general.  Weight is stable.  Last echo in 1/14 showed mild to moderate aortic stenosis.    Labs (10/11): LDL 113, HDL 46, K 5.1, creatinine 1.1, LFTs normal Labs (1/12): K 5.3, creatinine 1.1 Labs (3/12): LDL 51, HDL 37 Labs (12/12): K 4.1, creatinine  0.98 Labs (4/13): LDL 53, HDL 45 Labs (9/13): LDL 54, HDL 46, LFTs normal, TSH normal, K 4.7, creatinine 0.9 Labs (3/14): LDL 36, HDL 38 Labs (10/14): K 4.5, creatinine 1.1, AST 40, ALT 42, LDL 27, HDL 49  Allergies (verified):  1)  ! Pcn  Past Medical History: 1. Actinic Keratosis 2. Muscle strains  3. HTN 4. CAD: Anginal-type chest pain.  ETT-myoview (1/12) showed good exercise tolerance but also inferior and V5/V6 1 mm ST depression and chest pain with exercise.  Perfusion images showed reversible apical perfusion defect.  Findings suggested ischemia.  LHC (1/12): LAD heavily calcified with 90% calcified mid LAD stenosis after D2.  The LAD was small caliber (2-2.5 mm) at this point.  Because of this and his lack of unstable symptoms, I elected to manage him medically initially.  ETT-myoview (11/12): 9'30" exercise with mild chest pain and ischemic ST changes.  There was a small reversible apical perfusion defect, similar to the 1/12 study.  LHC repeated 12/12 with 95% mid LAD stenosis after D2.  Promus DES to mid LAD.  Exertional chest pain recurred with LHC done again in 10/13.  There was 99% proximal LAD stenosis just proximal to the stent.  Patient received a Promus DES.   5. Bicuspid aortic valve with mild-moderate AS: Echo (1/12) showed  EF 60-65%, bicuspid aortic valve with minimal stenosis (mean gradient 9 mmHg), grade II diastolic dysfunction, mild MR, mild LAE.  MR angiogram chest: No thoracic aortic aneurysm.  Echo (1/14) showed EF 65-70% with mild to moderate AS, mean gradient 22 mmHg.  6. Carotid dopplers (1/12): No significant stenosis.  7.  Hyperlipidemia: Myalgias with Lipitor.  8.  Mild bradycardia  Family History: 2 brothers with CAD diagnosed in their 2s.  Sister with diabetes, died of complications from this disease.   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 tamsulosin (FLOMAX) 0.4 MG CAPS capsule Take 0.4 mg by mouth daily.      Marland Kitchen aspirin 81 MG tablet Take 81 mg by mouth daily.        Marland Kitchen atorvastatin (LIPITOR) 40 MG tablet Take 1 tablet (40 mg total) by mouth daily.  90 tablet  3  . clopidogrel (PLAVIX) 75 MG tablet Take 1 tablet (75 mg total) by mouth daily.  90 tablet  3  . fluticasone (FLONASE) 50 MCG/ACT nasal spray Place 2 sprays into the nose daily.        Marland Kitchen lisinopril (PRINIVIL,ZESTRIL) 10 MG tablet Take 1 tablet (10 mg total) by mouth daily.  30 tablet  11  . loratadine (CLARITIN) 10 MG tablet Take 10 mg by mouth daily.        . magnesium gluconate (MAGONATE) 500 MG tablet Take 500 mg by mouth daily.        . metoprolol tartrate (LOPRESSOR) 25 MG tablet Take 25 mg by mouth 2 (two) times daily.      . nitroGLYCERIN (NITROSTAT) 0.4 MG SL tablet Place 1 tablet (0.4 mg total) under the tongue every 5 (five) minutes as needed for chest pain.  25 tablet  3   No current facility-administered medications for this visit.    BP 124/64  Pulse 60  Ht 5\' 11"  (1.803 m)  Wt 203 lb (92.08 kg)  BMI 28.33 kg/m2 General:  Well developed, well nourished, in no acute distress. Neck:  Neck supple, no JVD. No masses, thyromegaly or abnormal cervical nodes. Lungs:  Clear bilaterally to auscultation and percussion. Heart:  Non-displaced PMI, chest non-tender; regular rate and rhythm, S1, S2.  2/6 early systolic crescendo-decrescendo murmur RUSB.  S2 heard clearly. +S4. Carotid upstroke normal, bilateral carotid bruits versus radiated from the heart.  Pedals normal pulses. No edema, no varicosities. Abdomen:  Bowel sounds positive; abdomen soft and non-tender without masses, organomegaly, or hernias noted. No hepatosplenomegaly. Extremities:  No clubbing or cyanosis. Neurologic:  Alert and oriented x 3. Psych:  Normal affect.  Assessment/Plan:  AORTIC VALVE  DISORDERS Bicuspid aortic valve with mild to moderate AS and no associated thoracic aortic aneurysm.  Repeat echo in 1/15.   CAROTID BRUIT  Check carotid dopplers (bruit versus radiated murmur).  Coronary artery disease  Status post PCI with DES to proximal LAD in 10/13.  He has been doing well since procedure with no exertional chest pain.  He is back to his prior activities without problem.  Continue ASA 81, Plavix 75 (long-term), ACEI, metoprolol, and statin.  HYPERLIPIDEMIA-MIXED  Good lipids in 10/14.   [name] 09/27/2013 1:39 PM

## 2013-09-28 ENCOUNTER — Ambulatory Visit (HOSPITAL_COMMUNITY): Payer: Medicare Other | Attending: Internal Medicine

## 2013-09-28 DIAGNOSIS — I658 Occlusion and stenosis of other precerebral arteries: Secondary | ICD-10-CM | POA: Insufficient documentation

## 2013-09-28 DIAGNOSIS — I10 Essential (primary) hypertension: Secondary | ICD-10-CM | POA: Insufficient documentation

## 2013-09-28 DIAGNOSIS — R0989 Other specified symptoms and signs involving the circulatory and respiratory systems: Secondary | ICD-10-CM | POA: Insufficient documentation

## 2013-09-28 DIAGNOSIS — Z87891 Personal history of nicotine dependence: Secondary | ICD-10-CM | POA: Insufficient documentation

## 2013-09-28 DIAGNOSIS — I6529 Occlusion and stenosis of unspecified carotid artery: Secondary | ICD-10-CM | POA: Insufficient documentation

## 2013-09-28 DIAGNOSIS — I350 Nonrheumatic aortic (valve) stenosis: Secondary | ICD-10-CM

## 2013-10-10 ENCOUNTER — Telehealth: Payer: Self-pay | Admitting: Cardiology

## 2013-10-10 NOTE — Telephone Encounter (Signed)
Pt states he was [facility] for [facility]. He has been on it a couple of weeks now. Today he experienced some dizziness and checked his BP, it was 103/63. Pt is going to stop tamsulosin and call [facility] for other recommendations for tamsulosin. He will call back if he has further symptoms.

## 2013-10-10 NOTE — Telephone Encounter (Signed)
New Problem  Pt request a call back to discuss his RX; No further details// Please call

## 2013-11-30 ENCOUNTER — Encounter: Payer: Self-pay | Admitting: Gastroenterology

## 2013-12-17 ENCOUNTER — Encounter: Payer: Self-pay | Admitting: Nurse Practitioner

## 2013-12-26 ENCOUNTER — Other Ambulatory Visit (INDEPENDENT_AMBULATORY_CARE_PROVIDER_SITE_OTHER): Payer: Medicare Other

## 2013-12-26 ENCOUNTER — Other Ambulatory Visit: Payer: Self-pay | Admitting: *Deleted

## 2013-12-26 ENCOUNTER — Encounter: Payer: Self-pay | Admitting: Nurse Practitioner

## 2013-12-26 ENCOUNTER — Encounter: Payer: Self-pay | Admitting: Gastroenterology

## 2013-12-26 ENCOUNTER — Telehealth: Payer: Self-pay | Admitting: *Deleted

## 2013-12-26 ENCOUNTER — Ambulatory Visit (INDEPENDENT_AMBULATORY_CARE_PROVIDER_SITE_OTHER): Payer: Medicare Other | Admitting: Nurse Practitioner

## 2013-12-26 VITALS — BP 136/56 | HR 60 | Ht 71.0 in | Wt 201.0 lb

## 2013-12-26 DIAGNOSIS — I2510 Atherosclerotic heart disease of native coronary artery without angina pectoris: Secondary | ICD-10-CM

## 2013-12-26 DIAGNOSIS — Z86010 Personal history of colonic polyps: Secondary | ICD-10-CM

## 2013-12-26 DIAGNOSIS — R195 Other fecal abnormalities: Secondary | ICD-10-CM

## 2013-12-26 DIAGNOSIS — Z860101 Personal history of adenomatous and serrated colon polyps: Secondary | ICD-10-CM | POA: Insufficient documentation

## 2013-12-26 LAB — CBC WITH DIFFERENTIAL/PLATELET
BASOS PCT: 0.4 % (ref 0.0–3.0)
Basophils Absolute: 0 10*3/uL (ref 0.0–0.1)
EOS PCT: 1.9 % (ref 0.0–5.0)
Eosinophils Absolute: 0.1 10*3/uL (ref 0.0–0.7)
HEMATOCRIT: 38.9 % — AB (ref 39.0–52.0)
HEMOGLOBIN: 13.2 g/dL (ref 13.0–17.0)
LYMPHS ABS: 1.6 10*3/uL (ref 0.7–4.0)
Lymphocytes Relative: 22 % (ref 12.0–46.0)
MCHC: 34 g/dL (ref 30.0–36.0)
MCV: 94.4 fl (ref 78.0–100.0)
MONO ABS: 0.9 10*3/uL (ref 0.1–1.0)
MONOS PCT: 12.6 % — AB (ref 3.0–12.0)
Neutro Abs: 4.7 10*3/uL (ref 1.4–7.7)
Neutrophils Relative %: 63.1 % (ref 43.0–77.0)
PLATELETS: 180 10*3/uL (ref 150.0–400.0)
RBC: 4.12 Mil/uL — AB (ref 4.22–5.81)
RDW: 13.3 % (ref 11.5–14.6)
WBC: 7.4 10*3/uL (ref 4.5–10.5)

## 2013-12-26 MED ORDER — MOVIPREP 100 G PO SOLR: 1.0000 | ORAL | Status: DC

## 2013-12-26 NOTE — Patient Instructions (Addendum)
Please go to the basement level to have your labs drawn.  You have been scheduled for a colonoscopy with propofol. Please follow written instructions given to you at your visit today.  Please pick up your prep kit at the pharmacy within the next 1-3 days. [address]

## 2013-12-26 NOTE — Progress Notes (Signed)
HPI :  Patient is a 73 year old male known to Dr. Fuller Plan for history of Adenomatous colon polyps (2004) disease. No polyps on last colonoscopy 2010. Patient is here to discuss surveillance colonoscopy. He has a history of coronary artery disease and underwent stenting of the LAD in 2012 and again in 2013. He has been on Plavix since that time. The cardiac symptoms such as chest pains, shortness of breath, lightheadedness. Weight is stable, he has actually gained weight. Bowel movements are normal though he does describe occasional black flecks of material in stool when has seen black stools on occasion. No iron or bismuth products. No abdominal pain, no nausea.  Past Medical History  Diagnosis Date  . Keratosis, actinic   . Muscle strain   . HTN (hypertension)   . CAD (coronary artery disease)     a. 1/12  ETT-myoview: Inf &  V5/V6 1 mm ST dep w/ c/p - apical ishcemia;  b. 12/2010 Cath: LAD heavily calcified with 90% calcified mid  LAD stenosis after D2.  The LAD was small caliber (2-2.5 mm) at this point.;  c. 09/2012 Cath: LM 20, LAD 99p - prior to stent, 30 ISR, D1 50, LCX sm 40ost, RI large minor irregs, RCA diff minor irregs.;   . Hyperlipidemia   . Pneumonia 1958  . Bradycardia, sinus     Family History  Problem Relation Age of Onset  . Coronary artery disease Brother    History  Substance Use Topics  . Smoking status: Former Smoker -- 1.00 packs/day for 8 years    Types: Cigarettes    Quit date: 03/28/1972  . Smokeless tobacco: Never Used  . Alcohol Use: Yes     Comment: "last alcohol was ~ 1990's"   Current Outpatient Prescriptions  Medication Sig Dispense Refill  . aspirin 81 MG tablet Take 81 mg by mouth daily.        Marland Kitchen atorvastatin (LIPITOR) 40 MG tablet Take 1 tablet (40 mg total) by mouth daily.  90 tablet  3  . clopidogrel (PLAVIX) 75 MG tablet Take 1 tablet (75 mg total) by mouth daily.  90 tablet  3  . fluticasone (FLONASE) 50 MCG/ACT nasal spray Place 2 sprays  into the nose daily.        Marland Kitchen lisinopril (PRINIVIL,ZESTRIL) 10 MG tablet Take 1 tablet (10 mg total) by mouth daily.  30 tablet  11  . loratadine (CLARITIN) 10 MG tablet Take 10 mg by mouth daily.        . magnesium gluconate (MAGONATE) 500 MG tablet Take 500 mg by mouth daily.        . metoprolol tartrate (LOPRESSOR) 25 MG tablet Take 25 mg by mouth 2 (two) times daily.      . nitroGLYCERIN (NITROSTAT) 0.4 MG SL tablet Place 1 tablet (0.4 mg total) under the tongue every 5 (five) minutes as needed for chest pain.  25 tablet  3   No current facility-administered medications for this visit.   Allergies  Allergen Reactions  . Penicillins     RASH ON FEET   Review of Systems: All systems reviewed and negative except where noted in HPI.   Physical Exam: BP 136/56  Pulse 60  Ht 5\' 11"  (1.803 m)  Wt 201 lb (91.173 kg)  BMI 28.05 kg/m2 Constitutional: Pleasant,well-developed, white male in no acute distress. HEENT: Normocephalic and atraumatic. Conjunctivae are normal. No scleral icterus. Neck supple.  Cardiovascular: Normal rate, regular rhythm. Loud murmur present Pulmonary/chest: Effort  normal and breath sounds normal. No wheezing, rales or rhonchi. Abdominal: Soft, nondistended, nontender. Bowel sounds active throughout. There are no masses palpable. No hepatomegaly. Rectal:  Brown, Hemoccult-negative stool in vault. No hemorrhoids seen. Extremities: no edema Lymphadenopathy: No cervical adenopathy noted. Neurological: Alert and oriented to person place and time. Skin: Skin is warm and dry. No rashes noted. Psychiatric: Normal mood and affect. Behavior is normal.   ASSESSMENT AND PLAN:  104. 73 year old male with history of adenomatous colon polyps 2004. No polyps on last colonoscopy January 2010. Patient has received his colonoscopy recall letter and ready to proceed. The risks, benefits, and alternatives to colonoscopy with possible biopsy and possible polypectomy were discussed  with the patient and he consents to proceed. Patient is on Plavix which he could ideally be off for the procedure. We will contact cardiologist regarding this.    2. CAD, s/p PCI with DES to mid LAD Dec 2012 and PCI with stenting of proximal LAD in Oct 2013. Patient on chronic Plavix. Will contact his cardiologist about holding Plavix for colonoscopy.  3. Black flecks in stool and occasional black stool in the absence of bismuth or iron. Not sure what to make of this. Owens Shark, heme-negative stool on digital rectal exam today. I will check a CBC to be sure

## 2013-12-26 NOTE — Progress Notes (Signed)
Reviewed and agree with management plan.  Aubreyana Saltz T. Derrion Tritz, MD FACG 

## 2013-12-26 NOTE — Telephone Encounter (Signed)
/  06/2014   RE: Travis Armstrong DOB: Aug 05, 1941 MRN: [id number]   Dear Dr. Aundra Dubin,    We have scheduled the above patient for an endoscopic procedure. Our records show that he is on anticoagulation therapy.   Please advise as to how long the patient may come off his therapy of Plavix prior to the procedure, which is scheduled for 02-11-2014.  Please fax back/ or route the completed form to [facility] at [phone].   Sincerely,     Tye Savoy NP-C    [place]

## 2013-12-26 NOTE — Telephone Encounter (Signed)
OK to hold Plavix x 5 days.

## 2013-12-28 ENCOUNTER — Other Ambulatory Visit (HOSPITAL_COMMUNITY): Payer: Medicare Other

## 2013-12-28 ENCOUNTER — Ambulatory Visit (HOSPITAL_COMMUNITY): Payer: Medicare Other | Attending: Cardiology | Admitting: Cardiology

## 2013-12-28 ENCOUNTER — Encounter: Payer: Self-pay | Admitting: Internal Medicine

## 2013-12-28 DIAGNOSIS — I059 Rheumatic mitral valve disease, unspecified: Secondary | ICD-10-CM | POA: Insufficient documentation

## 2013-12-28 DIAGNOSIS — I350 Nonrheumatic aortic (valve) stenosis: Secondary | ICD-10-CM

## 2013-12-28 DIAGNOSIS — I2510 Atherosclerotic heart disease of native coronary artery without angina pectoris: Secondary | ICD-10-CM | POA: Insufficient documentation

## 2013-12-28 DIAGNOSIS — I359 Nonrheumatic aortic valve disorder, unspecified: Secondary | ICD-10-CM

## 2013-12-28 DIAGNOSIS — R0989 Other specified symptoms and signs involving the circulatory and respiratory systems: Secondary | ICD-10-CM

## 2013-12-28 DIAGNOSIS — I379 Nonrheumatic pulmonary valve disorder, unspecified: Secondary | ICD-10-CM | POA: Insufficient documentation

## 2013-12-28 DIAGNOSIS — Q231 Congenital insufficiency of aortic valve: Secondary | ICD-10-CM | POA: Insufficient documentation

## 2013-12-28 DIAGNOSIS — E785 Hyperlipidemia, unspecified: Secondary | ICD-10-CM | POA: Insufficient documentation

## 2013-12-28 DIAGNOSIS — I079 Rheumatic tricuspid valve disease, unspecified: Secondary | ICD-10-CM | POA: Insufficient documentation

## 2013-12-28 NOTE — Progress Notes (Signed)
Echo performed. 

## 2014-01-01 NOTE — Telephone Encounter (Signed)
Called the patient and let him know Dr. Aundra Dubin told us to have him hold the Plavix for 5 days prior to the procedure date of 01-22-3014.  I told him to not take the Plavix starting on 01-17-2014 and resume it on 01-23-2014.  Verbalized understanding instructions.

## 2014-01-07 ENCOUNTER — Telehealth: Payer: Self-pay | Admitting: Cardiology

## 2014-01-07 NOTE — Telephone Encounter (Signed)
TEE scheduled for 01/15/14 1PM with Dr Charlett Nose for pt.

## 2014-01-07 NOTE — Telephone Encounter (Signed)
New message     Talk to the nurse about procedure Dr Aundra Dubin want him to have

## 2014-01-07 NOTE — Telephone Encounter (Signed)
Whichever day is better on my schedule, go ahead and set him up.

## 2014-01-07 NOTE — Telephone Encounter (Signed)
Patient  has decided he would like to go ahead and schedule TEE--pt states this week is out but 1/27 or 1/30 would be good days for him, he has a colonoscopy scheduled for 01/21/14. I will review with Dr Aundra Dubin.

## 2014-01-08 ENCOUNTER — Encounter: Payer: Self-pay | Admitting: *Deleted

## 2014-01-08 NOTE — Telephone Encounter (Signed)
Patient is returning your call please call [phone].

## 2014-01-08 NOTE — Telephone Encounter (Signed)
Spoke with patient about TEE time and instructions., copy of instructions mailed to pt. Pt verbalized understanding.

## 2014-01-15 ENCOUNTER — Other Ambulatory Visit: Payer: Self-pay | Admitting: Cardiology

## 2014-01-15 ENCOUNTER — Ambulatory Visit (HOSPITAL_COMMUNITY)
Admission: RE | Admit: 2014-01-15 | Discharge: 2014-01-15 | Disposition: A | Discharge status: Home / Self Care | Payer: Medicare Other | Source: Ambulatory Visit | Attending: Cardiology | Admitting: Cardiology

## 2014-01-15 ENCOUNTER — Encounter (HOSPITAL_COMMUNITY): Payer: Self-pay | Admitting: Gastroenterology

## 2014-01-15 ENCOUNTER — Encounter (HOSPITAL_COMMUNITY)
Admission: RE | Disposition: A | Discharge status: Home / Self Care | Payer: Self-pay | Source: Ambulatory Visit | Attending: Cardiology

## 2014-01-15 DIAGNOSIS — Q231 Congenital insufficiency of aortic valve: Secondary | ICD-10-CM | POA: Insufficient documentation

## 2014-01-15 DIAGNOSIS — I080 Rheumatic disorders of both mitral and aortic valves: Secondary | ICD-10-CM | POA: Insufficient documentation

## 2014-01-15 DIAGNOSIS — I359 Nonrheumatic aortic valve disorder, unspecified: Secondary | ICD-10-CM

## 2014-01-15 DIAGNOSIS — Z7982 Long term (current) use of aspirin: Secondary | ICD-10-CM | POA: Insufficient documentation

## 2014-01-15 DIAGNOSIS — Z7902 Long term (current) use of antithrombotics/antiplatelets: Secondary | ICD-10-CM | POA: Insufficient documentation

## 2014-01-15 DIAGNOSIS — Z87891 Personal history of nicotine dependence: Secondary | ICD-10-CM | POA: Insufficient documentation

## 2014-01-15 HISTORY — PX: TEE WITHOUT CARDIOVERSION: SHX5443

## 2014-01-15 SURGERY — ECHOCARDIOGRAM, TRANSESOPHAGEAL
Anesthesia: Moderate Sedation

## 2014-01-15 MED ORDER — MIDAZOLAM HCL 10 MG/2ML IJ SOLN
INTRAMUSCULAR | Status: DC | PRN
  Administered 2014-01-15: 2 mg via INTRAVENOUS
  Administered 2014-01-15: 1 mg via INTRAVENOUS

## 2014-01-15 MED ORDER — SODIUM CHLORIDE 0.9 % IV SOLN
INTRAVENOUS | Status: DC
  Administered 2014-01-15: 500 mL via INTRAVENOUS

## 2014-01-15 MED ORDER — MIDAZOLAM HCL 5 MG/ML IJ SOLN
INTRAMUSCULAR | Status: AC
  Filled 2014-01-15: qty 2

## 2014-01-15 MED ORDER — FENTANYL CITRATE 0.05 MG/ML IJ SOLN
INTRAMUSCULAR | Status: AC
  Filled 2014-01-15: qty 2

## 2014-01-15 MED ORDER — BUTAMBEN-TETRACAINE-BENZOCAINE 2-2-14 % EX AERO
INHALATION_SPRAY | CUTANEOUS | Status: DC | PRN
  Administered 2014-01-15: 2 via TOPICAL

## 2014-01-15 MED ORDER — FENTANYL CITRATE 0.05 MG/ML IJ SOLN
INTRAMUSCULAR | Status: DC | PRN
  Administered 2014-01-15: 25 ug via INTRAVENOUS

## 2014-01-15 NOTE — Discharge Instructions (Signed)
Transesophageal Echocardiography Transesophageal echocardiography (TEE) is a special type of test that produces images of the heart by using sound waves (echocardiogram). This type of echocardiography can obtain better images of the heart than standard echocardiography. TEE is done by passing a flexible tube down the esophagus. The heart is located in front of the esophagus. Because the heart and esophagus are close to one another, your health care provider can take very clear, detailed pictures of the heart via ultrasound waves. TEE may be done:  If your health care provider needs more information based on standard echocardiography findings.  If you had a stroke. This might have happened because a clot formed in your heart. TEE can visualize different areas of the heart and check for clots.  To check valve anatomy and function.  To check for infection on the inside of your heart (endocarditis).  To evaluate the dividing wall (septum) of the heart and presence of a hole that did not close after birth (patent foramen ovale or atrial septal defect).  To help diagnose a tear in the wall of the aorta (aortic dissection).  During cardiac valve surgery. This allows the surgeon to assess the valve repair before closing the chest.  During a variety of other cardiac procedures to guide positioning of catheters.  Sometimes before a cardioversion, which is a shock to convert heart rhythm back to normal. LET [facility] CARE PROVIDER KNOW ABOUT:   Any allergies you have.  All medicines you are taking, including vitamins, herbs, eye drops, creams, and over-the-counter medicines.  Previous problems you or members of your family have had with the use of anesthetics.  Any blood disorders you have.  Previous surgeries you have had.  Medical conditions you have.  Swallowing difficulties.  An esophageal obstruction. RISKS AND COMPLICATIONS  Generally, TEE is a safe procedure. However, as with any  procedure, complications can occur. Possible complications include an esophageal tear (rupture). BEFORE THE PROCEDURE   Do not eat or drink for 6 hours before the procedure or as directed by your health care provider.  Arrange for someone to drive you home after the procedure. Do not drive yourself home. During the procedure, you will be given medicines that can continue to make you feel drowsy and can impair your reflexes.  An IV access tube will be started in the arm. PROCEDURE   A medicine to help you relax (sedative) will be given through the IV access tube.  A medicine that numbs the area (local anesthetic) may be sprayed in the back of the throat.  Your blood pressure, heart rate, and breathing (vital signs) will be monitored during the procedure.  The TEE probe is a long, flexible tube. The tip of the probe is placed into the back of the mouth, and you will be asked to swallow. This helps to pass the tip of the probe into the esophagus. Once the tip of the probe is in the correct area, your health care provider can take pictures of the heart.  TEE is usually not a painful procedure. You may feel the probe press against the back of the throat. The probe does not enter the trachea and does not affect your breathing.  Your time spent at the hospital is usually less than 2 hours. AFTER THE PROCEDURE   You will be in bed, resting, until you have fully returned to consciousness.  When you first awaken, your throat may feel slightly sore and will probably still feel numb. This will  improve slowly over time.  You will not be allowed to eat or drink until it is clear that the numbness has improved.  Once you have been able to drink, urinate, and sit on the edge of the bed without feeling sick to your stomach (nausea) or dizzy, you may be cleared to go home.  You should have a friend or family member with you for the next 24 hours after your procedure. Document Released: 02/26/2003  Document Revised: 09/26/2013 Document Reviewed: 06/07/2013 [facility] Patient Information 2014 [place], [place].

## 2014-01-15 NOTE — Progress Notes (Signed)
  Echocardiogram Echocardiogram Transesophageal has been performed.  Travis Armstrong, [place] 01/15/2014, 1:44 PM

## 2014-01-15 NOTE — CV Procedure (Addendum)
Procedure: TEE  Indication: Aortic stenosis, assess severity.   Sedation: Versed 3 mg IV, Fentanyl 25 mcg IV  Findings: Please see echo section for full report.  Normal LV size with mild LV hypertrophy.  EF 60-65%.  There was a bicuspid aortic valve with moderate aortic stenosis, mean gradient 26 mmHg.  AVA by planimetry was 1.0 cm^2.  There was moderate MR.  Normal RV size and systolic function.  No aortic aneurysm noted.   Continue to follow valve, repeat echo in 1 year.   Travis Armstrong 01/15/2014 1:38 PM

## 2014-01-15 NOTE — H&P (Signed)
Physician History and Physical    Travis Armstrong MRN: [id number] DOB/AGE: [birth year] 73 y.o. Admit date: 01/15/2014  HPI:  Bicuspid aortic valve, moderate to severe AS. Patient presents for TEE today to look more closely at the valve and assess if truly severe.   Review of systems complete and found to be negative unless listed above   Family History  Problem Relation Age of Onset  . Coronary artery disease Brother     History   Social History  . Marital Status: Married    Spouse Name: N/A    Number of Children: N/A  . Years of Education: N/A   Occupational History  . Not on file.   Social History Main Topics  . Smoking status: Former Smoker -- 1.00 packs/day for 8 years    Types: Cigarettes    Quit date: 03/28/1972  . Smokeless tobacco: Never Used  . Alcohol Use: Yes     Comment: "last alcohol was ~ 1990's"  . Drug Use: No  . Sexual Activity: Not Currently     Comment: "stopped smoking cigarettes 1973"   Other Topics Concern  . Not on file   Social History Narrative   Lives in [place] with his wife.  Retired.     Prescriptions prior to admission  Medication Sig Dispense Refill  . aspirin 81 MG tablet Take 81 mg by mouth daily.        Marland Kitchen atorvastatin (LIPITOR) 40 MG tablet Take 1 tablet (40 mg total) by mouth daily.  90 tablet  3  . clopidogrel (PLAVIX) 75 MG tablet Take 1 tablet (75 mg total) by mouth daily.  90 tablet  3  . fluticasone (FLONASE) 50 MCG/ACT nasal spray Place 2 sprays into the nose daily.        Marland Kitchen lisinopril (PRINIVIL,ZESTRIL) 10 MG tablet Take 1 tablet (10 mg total) by mouth daily.  30 tablet  11  . loratadine (CLARITIN) 10 MG tablet Take 10 mg by mouth daily.        . magnesium gluconate (MAGONATE) 500 MG tablet Take 500 mg by mouth daily.        . metoprolol tartrate (LOPRESSOR) 25 MG tablet Take 25 mg by mouth 2 (two) times daily.      Marland Kitchen MOVIPREP 100 G SOLR Take 1 kit (200 g total) by mouth as directed.  1 kit  0  . nitroGLYCERIN  (NITROSTAT) 0.4 MG SL tablet Place 1 tablet (0.4 mg total) under the tongue every 5 (five) minutes as needed for chest pain.  25 tablet  3    Physical Exam: Blood pressure 153/71, pulse 52, temperature 97.2 F (36.2 C), temperature source Oral, resp. rate 14, height _0  (1.803 m), weight 200 lb (90.719 kg), SpO2 99.00%.  General: NAD Neck: No JVD, no thyromegaly or thyroid nodule.  Lungs: Clear to auscultation bilaterally with normal respiratory effort. CV: Nondisplaced PMI.  Heart regular S1/S2, no S3/S4, 3/6 SEM RUSB.  No peripheral edema.  No carotid bruit.  Normal pedal pulses.  Abdomen: Soft, nontender, no hepatosplenomegaly, no distention.  Skin: Intact without lesions or rashes.  Neurologic: Alert and oriented x 3.  Psych: Normal affect. Extremities: No clubbing or cyanosis.  HEENT: Normal.   Labs:   Lab Results  Component Value Date   WBC 7.4 12/26/2013   HGB 13.2 12/26/2013   HCT 38.9* 12/26/2013   MCV 94.4 12/26/2013   PLT 180.0 12/26/2013   No results found for this basename: NA, K,  CL, CO2, BUN, CREATININE, CALCIUM, LABALBU, PROT, BILITOT, ALKPHOS, ALT, AST, GLUCOSE,  in the last 168 hours No results found for this basename: CKTOTAL, CKMB, CKMBINDEX, TROPONINI    Lab Results  Component Value Date   CHOL 81 02/26/2013   CHOL 113 03/28/2012   CHOL 101 03/02/2011   Lab Results  Component Value Date   HDL 37.70* 02/26/2013   HDL 44.90 03/28/2012   HDL 37.10* 03/02/2011   Lab Results  Component Value Date   LDLCALC 36 02/26/2013   LDLCALC 53 03/28/2012   LDLCALC 51 03/02/2011   Lab Results  Component Value Date   TRIG 39.0 02/26/2013   TRIG 75.0 03/28/2012   TRIG 64.0 03/02/2011   Lab Results  Component Value Date   CHOLHDL 2 02/26/2013   CHOLHDL 3 03/28/2012   CHOLHDL 3 03/02/2011   No results found for this basename: LDLDIRECT       ASSESSMENT AND PLAN:  Moderate to severe AS by TTE.  Will assess closer by TEE.   Signed: Loralie Champagne 01/15/2014, 1:13 PM

## 2014-01-16 ENCOUNTER — Encounter (HOSPITAL_COMMUNITY): Payer: Self-pay | Admitting: Cardiology

## 2014-01-17 SURGERY — ECHOCARDIOGRAM, TRANSESOPHAGEAL
Anesthesia: Moderate Sedation

## 2014-01-22 ENCOUNTER — Encounter: Payer: Self-pay | Admitting: Gastroenterology

## 2014-01-22 ENCOUNTER — Ambulatory Visit (AMBULATORY_SURGERY_CENTER): Payer: Medicare Other | Admitting: Gastroenterology

## 2014-01-22 VITALS — BP 133/78 | HR 47 | Temp 95.8°F | Resp 16 | Ht 71.0 in | Wt 201.0 lb

## 2014-01-22 DIAGNOSIS — Z860100 Personal history of colon polyps, unspecified: Secondary | ICD-10-CM

## 2014-01-22 MED ORDER — SODIUM CHLORIDE 0.9 % IV SOLN: 500.0000 mL | INTRAVENOUS | Status: DC

## 2014-01-22 NOTE — Op Note (Addendum)
[place]  [address] [employer] [address] [place], [address]   COLONOSCOPY PROCEDURE REPORT  PATIENT: Travis Armstrong, Travis Armstrong  MR#: [id number] BIRTHDATE: 1941/07/03 , 72  yrs. old GENDER: Male ENDOSCOPIST: Ladene Artist, MD, [facility] PROCEDURE DATE:  01/22/2014 PROCEDURE:   Colonoscopy, surveillance First Screening Colonoscopy - Avg.  risk and is 50 yrs.  old or older - No.  Prior Negative Screening - Now for repeat screening. N/A  History of Adenoma - Now for follow-up colonoscopy & has been > or = to 3 yrs.  Yes hx of adenoma.  Has been 3 or more years since last colonoscopy.  Polyps Removed Today? No.  Recommend repeat exam, <10 yrs? Yes.  High risk (family or personal hx). ASA CLASS:   Class II INDICATIONS:Patient's personal history of adenomatous colon polyps.  MEDICATIONS: MAC sedation, administered by CRNA and propofol (Diprivan) 170mg  IV DESCRIPTION OF PROCEDURE:   After the risks benefits and alternatives of the procedure were thoroughly explained, informed consent was obtained.  A digital rectal exam revealed no abnormalities of the rectum.   The LB [id number]  endoscope was introduced through the anus and advanced to the cecum, which was identified by both the appendix and ileocecal valve. No adverse events experienced.   The quality of the prep was good, using MoviPrep  The instrument was then slowly withdrawn as the colon was fully examined.  COLON FINDINGS: Moderate diverticulosis was noted in the descending colon and sigmoid colon.   The colon was otherwise normal.  There was no diverticulosis, inflammation, polyps or cancers unless previously stated.  Retroflexed views revealed moderate internal hemorrhoids. The time to cecum=2 minutes 21 seconds.  Withdrawal time=8 minutes 43 seconds.  The scope was withdrawn and the procedure completed. COMPLICATIONS: There were no complications.  ENDOSCOPIC IMPRESSION: 1.   Moderate diverticulosis in the descending colon and  sigmoid colon 2.   Moderate internal hemorrhoids  RECOMMENDATIONS: 1.  High fiber diet with liberal fluid intake. 2.  No plans for future screening or surveillance colonoscopies as his last 2 negative colonoscopies did not have polyps and he will be over 80 for the next 10 interval. 3.  Resume Plavix today  eSigned:  Ladene Artist, MD, [facility] 01/22/2014 11:54 AM Revised: 01/22/2014 11:54 AM  cc: Kathryne Eriksson, MD

## 2014-01-22 NOTE — Patient Instructions (Signed)
Discharge instructions given with verbal understanding. Handouts on diverticulosis and hemorrhoids. Resume previous medications. YOU HAD AN ENDOSCOPIC PROCEDURE TODAY AT THE [facility]: Refer to the procedure report that was given to you for any specific questions about what was found during the examination.  If the procedure report does not answer your questions, please call your gastroenterologist to clarify.  If you requested that your care partner not be given the details of your procedure findings, then the procedure report has been included in a sealed envelope for you to review at your convenience later.  YOU SHOULD EXPECT: Some feelings of bloating in the abdomen. Passage of more gas than usual.  Walking can help get rid of the air that was put into your GI tract during the procedure and reduce the bloating. If you had a lower endoscopy (such as a colonoscopy or flexible sigmoidoscopy) you may notice spotting of blood in your stool or on the toilet paper. If you underwent a bowel prep for your procedure, then you may not have a normal bowel movement for a few days.  DIET: Your first meal following the procedure should be a light meal and then it is ok to progress to your normal diet.  A half-sandwich or bowl of soup is an example of a good first meal.  Heavy or fried foods are harder to digest and may make you feel nauseous or bloated.  Likewise meals heavy in dairy and vegetables can cause extra gas to form and this can also increase the bloating.  Drink plenty of fluids but you should avoid alcoholic beverages for 24 hours.  ACTIVITY: Your care partner should take you home directly after the procedure.  You should plan to take it easy, moving slowly for the rest of the day.  You can resume normal activity the day after the procedure however you should NOT DRIVE or use heavy machinery for 24 hours (because of the sedation medicines used during the test).    SYMPTOMS TO REPORT  IMMEDIATELY: A gastroenterologist can be reached at any hour.  During normal business hours, 8:30 AM to 5:00 PM Monday through Friday, call [phone].  After hours and on weekends, please call the GI answering service at [phone] who will take a message and have the physician on call contact you.   Following lower endoscopy (colonoscopy or flexible sigmoidoscopy):  Excessive amounts of blood in the stool  Significant tenderness or worsening of abdominal pains  Swelling of the abdomen that is new, acute  Fever of 100F or higher  FOLLOW UP: If any biopsies were taken you will be contacted by phone or by letter within the next 1-3 weeks.  Call your gastroenterologist if you have not heard about the biopsies in 3 weeks.  Our staff will call the home number listed on your records the next business day following your procedure to check on you and address any questions or concerns that you may have at that time regarding the information given to you following your procedure. This is a courtesy call and so if there is no answer at the home number and we have not heard from you through the emergency physician on call, we will assume that you have returned to your regular daily activities without incident.  SIGNATURES/CONFIDENTIALITY: You and/or your care partner have signed paperwork which will be entered into your electronic medical record.  These signatures attest to the fact that that the information above on your After Visit Summary   has been reviewed and is understood.  Full responsibility of the confidentiality of this discharge information lies with you and/or your care-partner. 

## 2014-01-23 ENCOUNTER — Telehealth: Payer: Self-pay | Admitting: *Deleted

## 2014-01-23 NOTE — Telephone Encounter (Signed)
  Follow up Call-  Call back number 01/22/2014  Post procedure Call Back phone  # [phone]  Permission to leave phone message Yes     Patient questions:  Do you have a fever, pain , or abdominal swelling? no Pain Score  0 *  Have you tolerated food without any problems? yes  Have you been able to return to your normal activities? yes  Do you have any questions about your discharge instructions: Diet   no Medications  no Follow up visit  no  Do you have questions or concerns about your Care? no  Actions: * If pain score is 4 or above: No action needed, pain <4.

## 2014-04-08 ENCOUNTER — Encounter: Payer: Self-pay | Admitting: *Deleted

## 2014-04-08 ENCOUNTER — Ambulatory Visit (INDEPENDENT_AMBULATORY_CARE_PROVIDER_SITE_OTHER): Payer: Medicare Other | Admitting: Cardiology

## 2014-04-08 ENCOUNTER — Encounter: Payer: Self-pay | Admitting: Cardiology

## 2014-04-08 ENCOUNTER — Encounter (HOSPITAL_COMMUNITY): Payer: Self-pay | Admitting: Pharmacy Technician

## 2014-04-08 VITALS — BP 140/70 | HR 52 | Ht 71.0 in | Wt 202.0 lb

## 2014-04-08 DIAGNOSIS — E785 Hyperlipidemia, unspecified: Secondary | ICD-10-CM

## 2014-04-08 DIAGNOSIS — I359 Nonrheumatic aortic valve disorder, unspecified: Secondary | ICD-10-CM

## 2014-04-08 DIAGNOSIS — I2510 Atherosclerotic heart disease of native coronary artery without angina pectoris: Secondary | ICD-10-CM

## 2014-04-08 DIAGNOSIS — I209 Angina pectoris, unspecified: Secondary | ICD-10-CM

## 2014-04-08 DIAGNOSIS — R0989 Other specified symptoms and signs involving the circulatory and respiratory systems: Secondary | ICD-10-CM

## 2014-04-08 LAB — CBC WITH DIFFERENTIAL/PLATELET
BASOS PCT: 0.3 % (ref 0.0–3.0)
Basophils Absolute: 0 10*3/uL (ref 0.0–0.1)
EOS PCT: 2 % (ref 0.0–5.0)
Eosinophils Absolute: 0.1 10*3/uL (ref 0.0–0.7)
HEMATOCRIT: 40.4 % (ref 39.0–52.0)
Hemoglobin: 13.7 g/dL (ref 13.0–17.0)
LYMPHS ABS: 1.5 10*3/uL (ref 0.7–4.0)
Lymphocytes Relative: 20 % (ref 12.0–46.0)
MCHC: 33.9 g/dL (ref 30.0–36.0)
MCV: 96.7 fl (ref 78.0–100.0)
MONOS PCT: 11.1 % (ref 3.0–12.0)
Monocytes Absolute: 0.8 10*3/uL (ref 0.1–1.0)
NEUTROS ABS: 5 10*3/uL (ref 1.4–7.7)
Neutrophils Relative %: 66.6 % (ref 43.0–77.0)
Platelets: 176 10*3/uL (ref 150.0–400.0)
RBC: 4.18 Mil/uL — AB (ref 4.22–5.81)
RDW: 13.8 % (ref 11.5–14.6)
WBC: 7.5 10*3/uL (ref 4.5–10.5)

## 2014-04-08 LAB — BASIC METABOLIC PANEL
BUN: 12 mg/dL (ref 6–23)
CO2: 29 meq/L (ref 19–32)
Calcium: 9.4 mg/dL (ref 8.4–10.5)
Chloride: 102 mEq/L (ref 96–112)
Creatinine, Ser: 1.1 mg/dL (ref 0.4–1.5)
GFR: 71.24 mL/min (ref >60.00)
GLUCOSE: 91 mg/dL (ref 70–99)
Potassium: 4 mEq/L (ref 3.5–5.1)
SODIUM: 137 meq/L (ref 135–145)

## 2014-04-08 LAB — LIPID PANEL
CHOLESTEROL: 85 mg/dL (ref 0–200)
HDL: 39.1 mg/dL (ref >39.00)
LDL Cholesterol: 29 mg/dL (ref 0–99)
TRIGLYCERIDES: 85 mg/dL (ref 0.0–149.0)
Total CHOL/HDL Ratio: 2
VLDL: 17 mg/dL (ref 0.0–40.0)

## 2014-04-08 LAB — SEDIMENTATION RATE: Sed Rate: 4 mm/hr (ref 0–22)

## 2014-04-08 LAB — PROTIME-INR
INR: 1 ratio (ref 0.8–1.0)
PROTHROMBIN TIME: 11.2 s (ref 9.6–13.1)

## 2014-04-08 NOTE — Progress Notes (Signed)
Patient ID: Travis Armstrong, male   DOB: [birth year], 73 y.o.   MRN: [id number] PCP: Dr. Redmond Pulling  73 yo with history of HTN presented initially for evaluation of chest pain in 2011.  It occurred with splitting wood and after walking about 1/4 mile on flat ground.  Given these exertional symptoms, I set him up for an ETT-myoview. This showed evidence for ischemia by exercise ECG, and there was a small reversible apical perfusion defect possibly suggestive of ischemia as well.  Echo to assess for cause of systolic murmur showed a bicuspid aortic valve with preserved LV systolic function and no significant aortic stenosis.  I set him up for a left heart cath in 1/12, which showed heavy calcification in the LAD.  There was a long 90% mid LAD stenosis.  The LAD was small at this point, a 2-2.5 mm vessel.  I decided to proceed with medical treatment as his symptoms were only with moderate exertion and as the LAD was a small caliber vessel at the site of the stenosis.  Given the bicuspid aortic valve, I did an MR angiogram of the thoracic aorta, which ruled out aortic aneurysm.  In the fall of 2012, he had recurrent chest pain and I repeated a cath, this showed worsening of the mid LAD stenosis up to 95%.  He underwent PCI with Promus DES to the mid LAD in 12/12.  In the fall of 2013, he again developed exertional chest pain.  I did a left heart cath in 10/13 showing 99% stenosis in the proximal LAD just proximal to the stent.  He had a Promus DES placed to the proximal LAD.    Since last appointment, patient had echo in 1/15 concerning for moderate-severe AS (progression).  TEE was done to assess more closely, this was more consistent with moderate AS.   For the last month, patient reports exertional chest tightness radiating to his neck.  This occurs when he walks about 2/10 of a mile or walks up a hill.  He has noted increased fatigue when playing golf.  He is concerned, says that symptoms are the same as prior to his  last 2 PCIs.  He also has been having muscle soreness in his legs and across his posterior shoulders.    ECG: NSR, normal  Labs (10/11): LDL 113, HDL 46, K 5.1, creatinine 1.1, LFTs normal Labs (1/12): K 5.3, creatinine 1.1 Labs (3/12): LDL 51, HDL 37 Labs (12/12): K 4.1, creatinine 0.98 Labs (4/13): LDL 53, HDL 45 Labs (9/13): LDL 54, HDL 46, LFTs normal, TSH normal, K 4.7, creatinine 0.9 Labs (3/14): LDL 36, HDL 38 Labs (10/14): K 4.5, creatinine 1.1, AST 40, ALT 42, LDL 27, HDL 44  Allergies (verified):  1)  ! Pcn  Past Medical History: 1. Actinic Keratosis 2. Muscle strains  3. HTN 4. CAD: Anginal-type chest pain.  ETT-myoview (1/12) showed good exercise tolerance but also inferior and V5/V6 1 mm ST depression and chest pain with exercise.  Perfusion images showed reversible apical perfusion defect.  Findings suggested ischemia.  LHC (1/12): LAD heavily calcified with 90% calcified mid LAD stenosis after D2.  The LAD was small caliber (2-2.5 mm) at this point.  Because of this and his lack of unstable symptoms, I elected to manage him medically initially.  ETT-myoview (11/12): 9'30" exercise with mild chest pain and ischemic ST changes.  There was a small reversible apical perfusion defect, similar to the 1/12 study.  LHC repeated 12/12 with 95%  mid LAD stenosis after D2.  Promus DES to mid LAD.  Exertional chest pain recurred with LHC done again in 10/13.  There was 99% proximal LAD stenosis just proximal to the stent.  Patient received a Promus DES.   5. Bicuspid aortic valve with mild-moderate AS: Echo (1/12) showed EF 60-65%, bicuspid aortic valve with minimal stenosis (mean gradient 9 mmHg), grade II diastolic dysfunction, mild MR, mild LAE.  MR angiogram chest (12/12): No thoracic aortic aneurysm.  Echo (1/14) showed EF 65-70% with mild to moderate AS, mean gradient 22 mmHg.  Echo (1/15) with moderate to severe AS, mean gradient 36, peak 55, AVA 1.0 cm^2.  TEE (1/15) with EF 60-65%,  bicuspid aortic valve, AVA 1.0 cm^2 with mean gradient 26 mmHg, peak 48 mmHg, moderate MR, RV normal.  6. Carotid dopplers (1/12): No significant stenosis.  Carotid dopplers (10/14) with mild bilateral ICA stenosis.  7.  Hyperlipidemia: Myalgias with Lipitor.  8.  Mild bradycardia  Family History: 2 brothers with CAD diagnosed in their 47s.  Sister with diabetes, died of complications from this disease.   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    Current Outpatient Prescriptions  Medication Sig Dispense Refill  . aspirin 81 MG tablet Take 81 mg by mouth daily.        Marland Kitchen atorvastatin (LIPITOR) 40 MG tablet Take 1 tablet (40 mg total) by mouth daily.  90 tablet  3  . Calcium 500 MG CHEW Chew by mouth daily.      . clopidogrel (PLAVIX) 75 MG tablet Take 1 tablet (75 mg total) by mouth daily.  90 tablet  3  . fluticasone (FLONASE) 50 MCG/ACT nasal spray Place 2 sprays into the nose daily.        Marland Kitchen lisinopril (PRINIVIL,ZESTRIL) 10 MG tablet Take 1 tablet (10 mg total) by mouth daily.  30 tablet  11  . loratadine (CLARITIN) 10 MG tablet Take 10 mg by mouth daily.        . magnesium gluconate (MAGONATE) 500 MG tablet Take 500 mg by mouth daily.        . metoprolol tartrate (LOPRESSOR) 25 MG tablet Take 25 mg by mouth 2 (two) times daily.      . nitroGLYCERIN (NITROSTAT) 0.4 MG SL tablet Place 1 tablet (0.4 mg total) under the tongue every 5 (five) minutes as needed for chest pain.  25 tablet  3  . Omega-3 Fatty Acids (FISH OIL PO) Take 1 capsule by mouth daily.        No current facility-administered medications for this visit.    BP 140/70  Pulse 52  Ht 5' 11" (1.803 m)  Wt 91.627 kg (202 lb)  BMI 28.19 kg/m2 General:  Well developed, well nourished, in no acute distress. Neck:  Neck supple, no  JVD. No masses, thyromegaly or abnormal cervical nodes. Lungs:  Clear bilaterally to auscultation and percussion. Heart:  Non-displaced PMI, chest non-tender; regular rate and rhythm, S1, S2.  3/6 mid-peaking systolic crescendo-decrescendo murmur RUSB.  S2 heard clearly. +S4. Carotid upstroke normal, bilateral carotid bruits versus radiated from the heart.  Pedals normal pulses. No edema, no varicosities. Abdomen:  Bowel sounds positive; abdomen soft and non-tender without masses, organomegaly, or hernias noted. No hepatosplenomegaly. Extremities:  No clubbing or cyanosis. Neurologic:  Alert  and oriented x 3. Psych:  Normal affect.  Assessment/Plan:  AORTIC VALVE DISORDERS Bicuspid aortic valve with probably moderate AS and no associated thoracic aortic aneurysm on MRA 12/12.  There was some concern for progression to severe AS on last echo but TEE did appear to show moderate AS in 1/15.  This will need to be closely followed.  Will likely get a limited transthoracic echo for aortic valve while he is at the hospital for cardiac cath later this week.   CAROTID BRUIT  Mild disease.  Probably radiated from AS murmur.  Coronary artery disease  Status post PCI with DES to proximal LAD in 10/13.  Continue ASA 81, Plavix 75 (long-term), ACEI, metoprolol, and statin.  Unfortunately, he has re-developed exertional chest tightness radiating to his neck that is reminiscent of prior angina.  He has a DES in the mid LAD and later had a DES placed just proximal to the first.  If he again has severe disease in the LAD, would consider combined CABG-AVR given at least moderate AS and recurrent LAD disease.  Will reassess aortic valve on the day of the cath, as above.  HYPERLIPIDEMIA-MIXED  Check lipids today.  Myalgias May be due to the statin.  However, prominent shoulder/upper back area pain.  Will get ESR, ?component of PMR.    Larey Dresser 04/08/2014 11:21 AM

## 2014-04-08 NOTE — Patient Instructions (Signed)
Your physician recommends that you have lab work today--BMET/CBCd/PT/INR/Sed rate/lipid profile.  Your physician has requested that you have a cardiac catheterization. Cardiac catheterization is used to diagnose and/or treat various heart conditions. Doctors may recommend this procedure for a number of different reasons. The most common reason is to evaluate chest pain. Chest pain can be a symptom of coronary artery disease (CAD), and cardiac catheterization can show whether plaque is narrowing or blocking your heart's arteries. This procedure is also used to evaluate the valves, as well as measure the blood flow and oxygen levels in different parts of your heart. For further information please visit [email or web address]. Please follow instruction sheet, as given. Friday April 24,2015  Your physician recommends that you schedule a follow-up appointment in: about 3 weeks with Dr Aundra Dubin.

## 2014-04-11 ENCOUNTER — Other Ambulatory Visit (HOSPITAL_COMMUNITY): Payer: Self-pay | Admitting: Cardiology

## 2014-04-11 DIAGNOSIS — R079 Chest pain, unspecified: Secondary | ICD-10-CM

## 2014-04-12 ENCOUNTER — Encounter (HOSPITAL_COMMUNITY)
Admission: RE | Disposition: A | Discharge status: Home / Self Care | Payer: Self-pay | Source: Ambulatory Visit | Attending: Cardiology

## 2014-04-12 ENCOUNTER — Ambulatory Visit (HOSPITAL_COMMUNITY)
Admission: RE | Admit: 2014-04-12 | Discharge: 2014-04-12 | Disposition: A | Discharge status: Home / Self Care | Payer: Medicare Other | Source: Ambulatory Visit | Attending: Cardiology | Admitting: Cardiology

## 2014-04-12 DIAGNOSIS — I359 Nonrheumatic aortic valve disorder, unspecified: Secondary | ICD-10-CM | POA: Insufficient documentation

## 2014-04-12 DIAGNOSIS — Y831 Surgical operation with implant of artificial internal device as the cause of abnormal reaction of the patient, or of later complication, without mention of misadventure at the time of the procedure: Secondary | ICD-10-CM | POA: Insufficient documentation

## 2014-04-12 DIAGNOSIS — R079 Chest pain, unspecified: Secondary | ICD-10-CM

## 2014-04-12 DIAGNOSIS — Z7902 Long term (current) use of antithrombotics/antiplatelets: Secondary | ICD-10-CM | POA: Insufficient documentation

## 2014-04-12 DIAGNOSIS — Z9861 Coronary angioplasty status: Secondary | ICD-10-CM | POA: Insufficient documentation

## 2014-04-12 DIAGNOSIS — Z7982 Long term (current) use of aspirin: Secondary | ICD-10-CM | POA: Insufficient documentation

## 2014-04-12 DIAGNOSIS — I10 Essential (primary) hypertension: Secondary | ICD-10-CM | POA: Insufficient documentation

## 2014-04-12 DIAGNOSIS — I209 Angina pectoris, unspecified: Secondary | ICD-10-CM | POA: Insufficient documentation

## 2014-04-12 DIAGNOSIS — Q231 Congenital insufficiency of aortic valve: Secondary | ICD-10-CM | POA: Insufficient documentation

## 2014-04-12 DIAGNOSIS — IMO0001 Reserved for inherently not codable concepts without codable children: Secondary | ICD-10-CM | POA: Insufficient documentation

## 2014-04-12 DIAGNOSIS — I498 Other specified cardiac arrhythmias: Secondary | ICD-10-CM | POA: Insufficient documentation

## 2014-04-12 DIAGNOSIS — I2510 Atherosclerotic heart disease of native coronary artery without angina pectoris: Secondary | ICD-10-CM

## 2014-04-12 DIAGNOSIS — E782 Mixed hyperlipidemia: Secondary | ICD-10-CM | POA: Insufficient documentation

## 2014-04-12 DIAGNOSIS — T82897A Other specified complication of cardiac prosthetic devices, implants and grafts, initial encounter: Secondary | ICD-10-CM | POA: Insufficient documentation

## 2014-04-12 DIAGNOSIS — Z87891 Personal history of nicotine dependence: Secondary | ICD-10-CM | POA: Insufficient documentation

## 2014-04-12 HISTORY — PX: LEFT HEART CATHETERIZATION WITH CORONARY ANGIOGRAM: SHX5451

## 2014-04-12 LAB — POCT ACTIVATED CLOTTING TIME: Activated Clotting Time: 310 seconds

## 2014-04-12 SURGERY — LEFT HEART CATHETERIZATION WITH CORONARY ANGIOGRAM
Anesthesia: LOCAL

## 2014-04-12 MED ORDER — MIDAZOLAM HCL 2 MG/2ML IJ SOLN
INTRAMUSCULAR | Status: AC
  Filled 2014-04-12: qty 2

## 2014-04-12 MED ORDER — SODIUM CHLORIDE 0.9 % IV SOLN: 250.0000 mL | INTRAVENOUS | Status: DC | PRN

## 2014-04-12 MED ORDER — SODIUM CHLORIDE 0.9 % IJ SOLN: 3.0000 mL | Freq: Two times a day (BID) | INTRAMUSCULAR | Status: DC

## 2014-04-12 MED ORDER — ADENOSINE 12 MG/4ML IV SOLN
16.0000 mL | Freq: Once | INTRAVENOUS | Status: AC
  Administered 2014-04-12: 48 mg via INTRAVENOUS
  Filled 2014-04-12: qty 16

## 2014-04-12 MED ORDER — SODIUM CHLORIDE 0.9 % IJ SOLN: 3.0000 mL | INTRAMUSCULAR | Status: DC | PRN

## 2014-04-12 MED ORDER — SODIUM CHLORIDE 0.9 % IV SOLN: INTRAVENOUS | Status: AC

## 2014-04-12 MED ORDER — ASPIRIN 81 MG PO CHEW: 81.0000 mg | CHEWABLE_TABLET | ORAL | Status: DC

## 2014-04-12 MED ORDER — HEPARIN (PORCINE) IN NACL 2-0.9 UNIT/ML-% IJ SOLN
INTRAMUSCULAR | Status: AC
  Filled 2014-04-12: qty 1000

## 2014-04-12 MED ORDER — NITROGLYCERIN 0.2 MG/ML ON CALL CATH LAB
INTRAVENOUS | Status: AC
  Filled 2014-04-12: qty 1

## 2014-04-12 MED ORDER — LIDOCAINE HCL (PF) 1 % IJ SOLN
INTRAMUSCULAR | Status: AC
  Filled 2014-04-12: qty 30

## 2014-04-12 MED ORDER — FENTANYL CITRATE 0.05 MG/ML IJ SOLN
INTRAMUSCULAR | Status: AC
  Filled 2014-04-12: qty 2

## 2014-04-12 MED ORDER — SODIUM CHLORIDE 0.9 % IV SOLN
INTRAVENOUS | Status: DC
  Administered 2014-04-12: 06:00:00 via INTRAVENOUS

## 2014-04-12 MED ORDER — HEPARIN SODIUM (PORCINE) 1000 UNIT/ML IJ SOLN
INTRAMUSCULAR | Status: AC
  Filled 2014-04-12: qty 1

## 2014-04-12 NOTE — H&P (View-Only) (Signed)
Patient ID: Travis Armstrong, male   DOB: 05-23-1941, 73 y.o.   MRN: [id number] PCP: Dr. Redmond Pulling  73 yo with history of HTN presented initially for evaluation of chest pain in 2011.  It occurred with splitting wood and after walking about 1/4 mile on flat ground.  Given these exertional symptoms, I set him up for an ETT-myoview. This showed evidence for ischemia by exercise ECG, and there was a small reversible apical perfusion defect possibly suggestive of ischemia as well.  Echo to assess for cause of systolic murmur showed a bicuspid aortic valve with preserved LV systolic function and no significant aortic stenosis.  I set him up for a left heart cath in 1/12, which showed heavy calcification in the LAD.  There was a long 90% mid LAD stenosis.  The LAD was small at this point, a 2-2.5 mm vessel.  I decided to proceed with medical treatment as his symptoms were only with moderate exertion and as the LAD was a small caliber vessel at the site of the stenosis.  Given the bicuspid aortic valve, I did an MR angiogram of the thoracic aorta, which ruled out aortic aneurysm.  In the fall of 2012, he had recurrent chest pain and I repeated a cath, this showed worsening of the mid LAD stenosis up to 95%.  He underwent PCI with Promus DES to the mid LAD in 12/12.  In the fall of 2013, he again developed exertional chest pain.  I did a left heart cath in 10/13 showing 99% stenosis in the proximal LAD just proximal to the stent.  He had a Promus DES placed to the proximal LAD.    Since last appointment, patient had echo in 1/15 concerning for moderate-severe AS (progression).  TEE was done to assess more closely, this was more consistent with moderate AS.   For the last month, patient reports exertional chest tightness radiating to his neck.  This occurs when he walks about 2/10 of a mile or walks up a hill.  He has noted increased fatigue when playing golf.  He is concerned, says that symptoms are the same as prior to his  last 2 PCIs.  He also has been having muscle soreness in his legs and across his posterior shoulders.    ECG: NSR, normal  Labs (10/11): LDL 113, HDL 46, K 5.1, creatinine 1.1, LFTs normal Labs (1/12): K 5.3, creatinine 1.1 Labs (3/12): LDL 51, HDL 37 Labs (12/12): K 4.1, creatinine 0.98 Labs (4/13): LDL 53, HDL 45 Labs (9/13): LDL 54, HDL 46, LFTs normal, TSH normal, K 4.7, creatinine 0.9 Labs (3/14): LDL 36, HDL 38 Labs (10/14): K 4.5, creatinine 1.1, AST 40, ALT 42, LDL 27, HDL 44  Allergies (verified):  1)  ! Pcn  Past Medical History: 1. Actinic Keratosis 2. Muscle strains  3. HTN 4. CAD: Anginal-type chest pain.  ETT-myoview (1/12) showed good exercise tolerance but also inferior and V5/V6 1 mm ST depression and chest pain with exercise.  Perfusion images showed reversible apical perfusion defect.  Findings suggested ischemia.  LHC (1/12): LAD heavily calcified with 90% calcified mid LAD stenosis after D2.  The LAD was small caliber (2-2.5 mm) at this point.  Because of this and his lack of unstable symptoms, I elected to manage him medically initially.  ETT-myoview (11/12): 9'30" exercise with mild chest pain and ischemic ST changes.  There was a small reversible apical perfusion defect, similar to the 1/12 study.  LHC repeated 12/12 with 95%  mid LAD stenosis after D2.  Promus DES to mid LAD.  Exertional chest pain recurred with LHC done again in 10/13.  There was 99% proximal LAD stenosis just proximal to the stent.  Patient received a Promus DES.   5. Bicuspid aortic valve with mild-moderate AS: Echo (1/12) showed EF 60-65%, bicuspid aortic valve with minimal stenosis (mean gradient 9 mmHg), grade II diastolic dysfunction, mild MR, mild LAE.  MR angiogram chest (12/12): No thoracic aortic aneurysm.  Echo (1/14) showed EF 65-70% with mild to moderate AS, mean gradient 22 mmHg.  Echo (1/15) with moderate to severe AS, mean gradient 36, peak 55, AVA 1.0 cm^2.  TEE (1/15) with EF 60-65%,  bicuspid aortic valve, AVA 1.0 cm^2 with mean gradient 26 mmHg, peak 48 mmHg, moderate MR, RV normal.  6. Carotid dopplers (1/12): No significant stenosis.  Carotid dopplers (10/14) with mild bilateral ICA stenosis.  7.  Hyperlipidemia: Myalgias with Lipitor.  8.  Mild bradycardia  Family History: 2 brothers with CAD diagnosed in their 60s.  Sister with diabetes, died of complications from this disease.   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    Current Outpatient Prescriptions  Medication Sig Dispense Refill  . aspirin 81 MG tablet Take 81 mg by mouth daily.        . atorvastatin (LIPITOR) 40 MG tablet Take 1 tablet (40 mg total) by mouth daily.  90 tablet  3  . Calcium 500 MG CHEW Chew by mouth daily.      . clopidogrel (PLAVIX) 75 MG tablet Take 1 tablet (75 mg total) by mouth daily.  90 tablet  3  . fluticasone (FLONASE) 50 MCG/ACT nasal spray Place 2 sprays into the nose daily.        . lisinopril (PRINIVIL,ZESTRIL) 10 MG tablet Take 1 tablet (10 mg total) by mouth daily.  30 tablet  11  . loratadine (CLARITIN) 10 MG tablet Take 10 mg by mouth daily.        . magnesium gluconate (MAGONATE) 500 MG tablet Take 500 mg by mouth daily.        . metoprolol tartrate (LOPRESSOR) 25 MG tablet Take 25 mg by mouth 2 (two) times daily.      . nitroGLYCERIN (NITROSTAT) 0.4 MG SL tablet Place 1 tablet (0.4 mg total) under the tongue every 5 (five) minutes as needed for chest pain.  25 tablet  3  . Omega-3 Fatty Acids (FISH OIL PO) Take 1 capsule by mouth daily.        No current facility-administered medications for this visit.    BP 140/70  Pulse 52  Ht 5' 11" (1.803 m)  Wt 91.627 kg (202 lb)  BMI 28.19 kg/m2 General:  Well developed, well nourished, in no acute distress. Neck:  Neck supple, no  JVD. No masses, thyromegaly or abnormal cervical nodes. Lungs:  Clear bilaterally to auscultation and percussion. Heart:  Non-displaced PMI, chest non-tender; regular rate and rhythm, S1, S2.  3/6 mid-peaking systolic crescendo-decrescendo murmur RUSB.  S2 heard clearly. +S4. Carotid upstroke normal, bilateral carotid bruits versus radiated from the heart.  Pedals normal pulses. No edema, no varicosities. Abdomen:  Bowel sounds positive; abdomen soft and non-tender without masses, organomegaly, or hernias noted. No hepatosplenomegaly. Extremities:  No clubbing or cyanosis. Neurologic:  Alert   and oriented x 3. Psych:  Normal affect.  Assessment/Plan:  AORTIC VALVE DISORDERS Bicuspid aortic valve with probably moderate AS and no associated thoracic aortic aneurysm on MRA 12/12.  There was some concern for progression to severe AS on last echo but TEE did appear to show moderate AS in 1/15.  This will need to be closely followed.  Will likely get a limited transthoracic echo for aortic valve while he is at the hospital for cardiac cath later this week.   CAROTID BRUIT  Mild disease.  Probably radiated from AS murmur.  Coronary artery disease  Status post PCI with DES to proximal LAD in 10/13.  Continue ASA 81, Plavix 75 (long-term), ACEI, metoprolol, and statin.  Unfortunately, he has re-developed exertional chest tightness radiating to his neck that is reminiscent of prior angina.  He has a DES in the mid LAD and later had a DES placed just proximal to the first.  If he again has severe disease in the LAD, would consider combined CABG-AVR given at least moderate AS and recurrent LAD disease.  Will reassess aortic valve on the day of the cath, as above.  HYPERLIPIDEMIA-MIXED  Check lipids today.  Myalgias May be due to the statin.  However, prominent shoulder/upper back area pain.  Will get ESR, ?component of PMR.    Perle Gibbon S Ziana Heyliger 04/08/2014 11:21 AM   

## 2014-04-12 NOTE — Interval H&P Note (Signed)
Cath Lab Visit (complete for each Cath Lab visit)  Clinical Evaluation Leading to the Procedure:   ACS: no  Non-ACS:    Anginal Classification: CCS III  Anti-ischemic medical therapy: Minimal Therapy (1 class of medications)  Non-Invasive Test Results: No non-invasive testing performed  Prior CABG: No previous CABG      History and Physical Interval Note:  04/12/2014 7:56 AM  Travis Armstrong  has presented today for surgery, with the diagnosis of angina  The various methods of treatment have been discussed with the patient and family. After consideration of risks, benefits and other options for treatment, the patient has consented to  Procedure(s): LEFT HEART CATHETERIZATION WITH CORONARY ANGIOGRAM (N/A) as a surgical intervention .  The patient's history has been reviewed, patient examined, no change in status, stable for surgery.  I have reviewed the patient's chart and labs.  Questions were answered to the patient's satisfaction.     Daltin Crist Claris Gladden

## 2014-04-12 NOTE — Progress Notes (Signed)
  Echocardiogram 2D Echocardiogram limited has been performed.  [place] 04/12/2014, 10:26 AM

## 2014-04-12 NOTE — CV Procedure (Signed)
     Cardiac Catheterization Operative Report  Travis Armstrong [id number] 4/24/20158:55 AM Woody Seller, MD  Procedure Performed:  1. Fractional flow reserve of the LAD  Operator: Lauree Chandler, MD  Indication:  73 yo male with history of CAD, AS with recent chest pain c/w unstable angina. Dr. Aundra Dubin performed his diagnostic cath and he is found to have a borderline LAD lesion in the old stent.                                      Procedure Details: The risks, benefits, complications, treatment options, and expected outcomes were discussed with the patient. The patient and/or family concurred with the proposed plan, giving informed consent. A 5/6 French sheath was present in the right radial artery. He was given a total of 9500 units IV heparin. I then engaged the left main with XB LAD 3.5 guiding catheter. I then passed a pressure wire down the LAD. Baseline FFR was 0.92. With infusion of IV adenosine, the FFR was 0.74. The lesion in the LAD is significant and flow limiting.   There were no immediate complications. The patient was taken to the recovery area in stable condition.   Impression: 1. CAD with severe stenosis mid LAD in previously stented segment  Recommendations: Further planning for CABG and AVR per Dr. Aundra Dubin.        Complications:  None; patient tolerated the procedure well.

## 2014-04-12 NOTE — Progress Notes (Signed)
3cc of air was removed from the TRB at 1110. Patient began to ooze- 3cc of air replaced in the TRB. Hemostasis achieved.

## 2014-04-12 NOTE — CV Procedure (Addendum)
    Cardiac Catheterization Procedure Note  Name: Travis Armstrong MRN: [id number] DOB: 1940-12-25  Procedure:  Selective Coronary Angiography  Indication: Angina in patient with known CAD.    Procedural Details: The right wrist was prepped, draped, and anesthetized with 1% lidocaine. Using the modified Seldinger technique, a 5 French sheath was introduced into the right radial artery. 3 mg of verapamil was administered through the sheath, weight-based unfractionated heparin was administered intravenously. Standard Judkins catheters were used for selective coronary angiography. Catheter exchanges were performed over an exchange length guidewire. There were no immediate procedural complications. The patient will get FFR of LAD lesion with Dr. Angelena Form.  Procedural Findings: Hemodynamics: AO 117/56  Coronary angiography: Coronary dominance: right  Left mainstem: 30% mid LM stenosis.   Left anterior descending (LAD): Long stented segment of the proximal to mid LAD.  There was approximately 70% in-stent restenosis in the mid LAD at D2.  The D2 was a small to moderate vessel with 90% ostial stenosis.   Left circumflex (LCx): Large ramus with luminal irregularities.  Small AV LCx with luminal irregularities.   Right coronary artery (RCA): Luminal irregularities.   Left ventriculography: Not done, AS with recent TEE and TTE.   Final Conclusions:  Patient has known at least moderate AS and has had recurrent LAD disease.  Patient has borderline mid LAD in-stent restenosis today with 90% stenosis in the ostial D2 (small to moderate vessel).  Symptoms could potentially come from D2.  Will plan FFR of LAD now.  If stenosis looks significant, would consider LIMA-LAD with AVR given recurrent LAD in-stent restenosis and at least moderate AS.  If LAD stenosis does not look significant, angina may be coming from D2.  In that situation, would add Imdur 30 mg daily to medical regimen.  Either way, will get  limited echo in recovery to reassess aortic valve gradient.   Larey Dresser 04/12/2014, 8:26 AM   FFR 0.74 so significant LAD lesion.  Moderate-severe AS on echo.  Will refer to CVTS for evaluation for LIMA-AVR.  Will not use Imdur.   Larey Dresser 04/12/2014 5:29 PM

## 2014-04-12 NOTE — Discharge Instructions (Signed)

## 2014-04-15 ENCOUNTER — Institutional Professional Consult (permissible substitution) (INDEPENDENT_AMBULATORY_CARE_PROVIDER_SITE_OTHER): Payer: Medicare Other | Admitting: Thoracic Surgery (Cardiothoracic Vascular Surgery)

## 2014-04-15 ENCOUNTER — Encounter: Payer: Self-pay | Admitting: Thoracic Surgery (Cardiothoracic Vascular Surgery)

## 2014-04-15 VITALS — BP 136/73 | HR 60 | Resp 20 | Ht 71.0 in | Wt 202.0 lb

## 2014-04-15 DIAGNOSIS — Q231 Congenital insufficiency of aortic valve: Secondary | ICD-10-CM

## 2014-04-15 DIAGNOSIS — I350 Nonrheumatic aortic (valve) stenosis: Secondary | ICD-10-CM | POA: Insufficient documentation

## 2014-04-15 DIAGNOSIS — I359 Nonrheumatic aortic valve disorder, unspecified: Secondary | ICD-10-CM

## 2014-04-15 DIAGNOSIS — I2510 Atherosclerotic heart disease of native coronary artery without angina pectoris: Secondary | ICD-10-CM | POA: Insufficient documentation

## 2014-04-15 NOTE — H&P (Addendum)
[address]       [place] [address]             [phone]     CARDIOTHORACIC SURGERY CONSULTATION REPORT  Referring Provider is [facility], Elby Showers, MD PCP is Woody Seller, MD  Chief Complaint  Patient presents with  . Aortic Stenosis    Surgical eval, Cardiac Cath and ECHO 04/12/14- Dr.Mclean  . Coronary Artery Disease    HPI:  Patient is a 73 year old retired white male from [place] referred for possible surgical treatment of bicuspid aortic valve with aortic stenosis and severe single-vessel coronary artery disease. The patient's cardiac history dates back to 20012 when he presented with symptoms of exertional angina.  He was noted to have a heart murmur on physical exam but echocardiogram revealed a bicuspid aortic valve but only mild aortic stenosis.  MRA of the chest was notable for the absence of significant aneurysmal enlargement of the ascending thoracic aorta. The patient underwent cardiac catheterization and was found to have high-grade stenosis of the left anterior descending coronary artery. He was treated with PCI and stenting using a drug-eluting stent. He did well for a period of time, but he develop recurrent angina in October of 2013. Followup catheterization revealed restenosis in the proximal left anterior descending coronary artery. He underwent repeat PCI and stenting with another drug-eluting stent. The patient has continued to follow closely with Dr. Aundra Dubin, and over the past 2 years he has demonstrated progression of severity of aortic stenosis. Followup transthoracic and transesophageal echocardiogram performed in January of this year demonstrates what appears to be bicuspid native aortic valve with at least moderate aortic stenosis with preserved LV systolic function.  Over the past 2 months the patient has developed recurrent symptoms of exertional chest tightness consistent with angina pectoris. He has also experienced increasing  fatigue, but he continues to deny symptoms of exertional shortness of breath. He develops chest tightness with walking or strenuous exertion, but symptoms are typically resolve quickly by rest. He denies any history PND, orthopnea, lower extremity edema, tachypalpitations, dizzy spells, or syncope.  He subsequently underwent left and right heart catheterization last week. This revealed restenosis of the mid left anterior descending coronary artery within the previous stents. There has not been any progression of other significant coronary artery disease. The patient has been referred for elective surgical consultation. Since he underwent catheterization he has had some accelerations symptoms of angina pectoris, but all episodes of chest pain and been transient and relieved by rest. He has not had to take any sublingual nitroglycerin.  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 He lives in [place] with his wife. He remains active physically and enjoys playing golf as well as working in their yard.  He has some arthritis involving his lower back and right hip, but this does not limit his mobility to much. He has no other significant physical limitations.  Past Medical History  Diagnosis Date  . Keratosis, actinic   . Muscle strain   . HTN (hypertension)   . CAD (coronary artery disease)   . Hyperlipidemia   . Pneumonia 1958  . Bradycardia, sinus   . Aortic stenosis   . Bicuspid aortic valve     Past Surgical History  Procedure Laterality Date  . Coronary angioplasty with stent placement  11/23/2011    DES mid LAD  . Coronary angioplasty with stent placement  10/02/2012    DES  prox LAD  . Inguinal hernia repair  ~ 2007    left  . Tee without cardioversion N/A 01/15/2014    Procedure: TRANSESOPHAGEAL ECHOCARDIOGRAM (TEE);  Surgeon: Larey Dresser, MD;  Location: [facility] ENDOSCOPY;  Service: Cardiovascular;  Laterality: N/A;    Family History  Problem Relation Age of  Onset  . Coronary artery disease Brother   . Colon cancer Neg Hx   . Esophageal cancer Neg Hx   . Stomach cancer Neg Hx   . Rectal cancer Neg Hx     History   Social History  . Marital Status: Married    Spouse Name: N/A    Number of Children: N/A  . Years of Education: N/A   Occupational History  . Not on file.   Social History Main Topics  . Smoking status: Former Smoker -- 1.00 packs/day for 8 years    Types: Cigarettes    Quit date: 03/28/1972  . Smokeless tobacco: Never Used  . Alcohol Use: No     Comment: "last alcohol was ~ 1990's"  . Drug Use: No  . Sexual Activity: Not Currently     Comment: "stopped smoking cigarettes 1973"   Other Topics Concern  . Not on file   Social History Narrative   Lives in [place] with his wife.  Retired.    Current Outpatient Prescriptions  Medication Sig Dispense Refill  . aspirin 81 MG tablet Take 81 mg by mouth daily.        Marland Kitchen atorvastatin (LIPITOR) 40 MG tablet Take 20 mg by mouth at bedtime.       Marland Kitchen CALCIUM PO Take 500 mg by mouth daily.      . clopidogrel (PLAVIX) 75 MG tablet Take 1 tablet (75 mg total) by mouth daily.  90 tablet  3  . fluticasone (FLONASE) 50 MCG/ACT nasal spray Place 2 sprays into both nostrils daily.       Marland Kitchen lisinopril (PRINIVIL,ZESTRIL) 10 MG tablet Take 1 tablet (10 mg total) by mouth daily.  30 tablet  11  . loratadine (CLARITIN) 10 MG tablet Take 10 mg by mouth daily.        . magnesium gluconate (MAGONATE) 500 MG tablet Take 500 mg by mouth daily.        . metoprolol tartrate (LOPRESSOR) 25 MG tablet Take 12.5 mg by mouth 2 (two) times daily.       . nitroGLYCERIN (NITROSTAT) 0.4 MG SL tablet Place 1 tablet (0.4 mg total) under the tongue every 5 (five) minutes as needed for chest pain.  25 tablet  3  . Omega-3 Fatty Acids (FISH OIL PO) Take 1 capsule by mouth daily.        No current facility-administered medications for this visit.    Allergies  Allergen Reactions  . Penicillins      RASH ON FEET      Review of Systems:   General:  normal appetite, decreased energy, no weight gain, no weight loss, no fever  Cardiac:  + chest pain with exertion, no chest pain at rest, no SOB with exertion, no resting SOB, no PND, no orthopnea, no palpitations, no arrhythmia, no atrial fibrillation, no LE edema, no dizzy spells, no syncope  Respiratory:  no shortness of breath, no home oxygen, no productive cough, no dry cough, no bronchitis, no wheezing, no hemoptysis, no asthma, no pain with inspiration or cough, no sleep apnea, no CPAP at night  GI:   no difficulty swallowing, no reflux, no frequent  heartburn, no hiatal hernia, no abdominal pain, no constipation, no diarrhea, no hematochezia, no hematemesis, no melena  GU:   no dysuria,  no frequency, no urinary tract infection, no hematuria, + enlarged prostate, no kidney stones, no kidney disease  Vascular:  no pain suggestive of claudication, no pain in feet, no leg cramps, no varicose veins, no DVT, no non-healing foot ulcer  Neuro:   no stroke, no TIA's, no seizures, no headaches, no temporary blindness one eye,  no slurred speech, no peripheral neuropathy, no chronic pain, no instability of gait, no memory/cognitive dysfunction  Musculoskeletal: + arthritis, no joint swelling, no myalgias, no difficulty walking, normal mobility   Skin:   no rash, no itching, no skin infections, no pressure sores or ulcerations  Psych:   no anxiety, no depression, no nervousness, no unusual recent stress  Eyes:   no blurry vision, no floaters, no recent vision changes, + wears glasses or contacts  ENT:   no hearing loss, no loose or painful teeth, no dentures, last saw dentist within the past year  Hematologic:  + easy bruising, + abnormal bleeding, no clotting disorder, no frequent epistaxis  Endocrine:  no diabetes, does not check CBG's at home     Physical Exam:   BP 136/73  Pulse 60  Resp 20  Ht 5\' 11"  (1.803 m)  Wt 202 lb (91.627 kg)  BMI  28.19 kg/m2  SpO2 98%  General:    well-appearing  HEENT:  Unremarkable   Neck:   no JVD, no bruits, no adenopathy   Chest:   clear to auscultation, symmetrical breath sounds, no wheezes, no rhonchi   CV:   RRR, grade III/VI crescendo/decrescendo systolic murmur   Abdomen:  soft, non-tender, no masses   Extremities:  warm, well-perfused, pulses diminished but palpable, no LE edema  Rectal/GU  Deferred  Neuro:   Grossly non-focal and symmetrical throughout  Skin:   Clean and dry, no rashes, no breakdown   Diagnostic Tests:  Transthoracic Echocardiography  Patient:    Kahn, Ledon MR #:       [id number] Study Date: 12/28/2013 Gender:     M Age:        72 Height:     180.3cm Weight:     92.1kg BSA:        2.58m^2 Pt. Status: Room:    ATTENDING    Revonda Standard, [place]  [place]andria Lodge, [place]  SONOGRAPHER  Coralyn Helling  PERFORMING   [facility], Outpatient cc:  ------------------------------------------------------------ LV EF: 60% -   65%  ------------------------------------------------------------ Indications:      424.1 Aortic valve disorders.  ------------------------------------------------------------ History:   PMH:  Bicuspid aortic valve. Acquired from the patient and from the patient's chart.  Coronary artery disease.  Aortic stenosis.  Aortic valve disease.  Risk factors:  Hypertension. Dyslipidemia.  ------------------------------------------------------------ Study Conclusions  - Left ventricle: The cavity size was normal. There was   moderate concentric hypertrophy. Systolic function was   normal. The estimated ejection fraction was in the range   of 60% to 65%. Wall motion was normal; there were no   regional wall motion abnormalities. Doppler parameters are   consistent with abnormal left ventricular relaxation   (grade 1 diastolic dysfunction). The E/e' ratio is >10,   suggesting elevated LV filling pressure. - Aortic  valve: Heavily calcified bicuspid (or functionally   bicuspid) aortic valve. Peak and mean gradients of 55 mmHg  and 36 mmHg, respectively. Based on an LVOT diameter of   2.4 cm, the calculated AVA is0.9 - 1.0cm2. This is   consistent with moderate to severe aortic stenosis. No   regurgitation. - Aorta: No suggestion of significant coarctation. - Aortic root: The aortic root was top normal in size. - Mitral valve: Posterior calcified annulus. Mild thickening   of the anterior leaflet. Mild regurgitation. - Left atrium: The atrium was mildly dilated (21 cm2). - Right atrium: The atrium was mildly dilated (18.5 cm2). - Tricuspid valve: Mildly thickened leaflets. Mild   regurgitation. RVSP could not be estimated due to lack of   TR jet. - Systemic veins: IVC was not well visualized. - Pericardium, extracardiac: There was no pericardial   effusion. Transthoracic echocardiography.  M-mode, complete 2D, spectral Doppler, and color Doppler.  Height:  Height: 180.3cm. Height: 71in.  Weight:  Weight: 92.1kg. Weight: 202.6lb.  Body mass index:  BMI: 28.3kg/m^2.  Body surface area:    BSA: 2.45m^2.  Blood pressure:     124/64.  Patient status:  Outpatient.  Location:  [facility]  ------------------------------------------------------------  ------------------------------------------------------------ Left ventricle:  The cavity size was normal. There was moderate concentric hypertrophy. Systolic function was normal. The estimated ejection fraction was in the range of 60% to 65%. Wall motion was normal; there were no regional wall motion abnormalities. Doppler parameters are consistent with abnormal left ventricular relaxation (grade 1 diastolic dysfunction). The E/e' ratio is >10, suggesting elevated LV filling pressure.  ------------------------------------------------------------ Aortic valve:  Heavily calcified bicuspid (or functionally bicuspid) aortic valve. Peak and mean  gradients of 55 mmHg and 36 mmHg, respectively. Based on an LVOT diameter of 2.4 cm, the calculated AVA is0.9 - 1.0cm2. This is consistent with moderate to severe aortic stenosis.  Doppler:   No regurgitation.    VTI ratio of LVOT to aortic valve: 0.21. Valve area: 0.94cm^2(VTI). Indexed valve area: 0.44cm^2/m^2 (VTI). Peak velocity ratio of LVOT to aortic valve: 0.22. Valve area: 0.99cm^2 (Vmax). Indexed valve area: 0.47cm^2/m^2 (Vmax).    Mean gradient: 32mm Hg (S). Peak gradient: 49mm Hg (S).  ------------------------------------------------------------ Aorta:  No suggestion of significant coarctation. Aortic root: The aortic root was top normal in size. Ascending aorta: The ascending aorta was normal in size.  ------------------------------------------------------------ Mitral valve:  Posterior calcified annulus. Mild thickening of the anterior leaflet. Mild regurgitation.  Doppler: Peak gradient: 3mm Hg (D).  ------------------------------------------------------------ Left atrium:  The atrium was mildly dilated (21 cm2).   ------------------------------------------------------------ Right ventricle:  The cavity size was normal. Wall thickness was normal. The moderator band was prominent. Systolic function was normal.  ------------------------------------------------------------ Pulmonic valve:    The valve appears to be grossly normal.  Doppler:   Mild regurgitation.  ------------------------------------------------------------ Tricuspid valve:   Mildly thickened leaflets.  Doppler: Mild regurgitation. RVSP could not be estimated due to lack of TR jet.  ------------------------------------------------------------ Pulmonary artery:   The main pulmonary artery was normal-sized.  ------------------------------------------------------------ Right atrium:  The atrium was mildly dilated (18.5  cm2).  ------------------------------------------------------------ Pericardium:  There was no pericardial effusion.  ------------------------------------------------------------ Systemic veins:  IVC was not well visualized.   ------------------------------------------------------------ Post procedure conclusions Ascending Aorta:  - No suggestion of significant coarctation.  ------------------------------------------------------------  2D measurements        Normal  Doppler measurements   Normal Left ventricle                 Main pulmonary LVID ED,     41 mm  43-52   artery chord,                         Pressure,    28 mm Hg  =30 PLAX                           S LVID ES,   27.4 mm     23-38   Left ventricle chord,                         Ea, lat    5.85 cm/s   ------ PLAX                           ann, tiss FS, chord,   33 %      >29     DP PLAX                           E/Ea, lat  14.1        ------ LVPW, ED     14 mm     ------  ann, tiss     7 IVS/LVPW   1.06        <1.3    DP ratio, ED                      Ea, med    6.34 cm/s   ------ Ventricular septum             ann, tiss IVS, ED    14.8 mm     ------  DP LVOT                           E/Ea, med  13.0        ------ Diam, S      24 mm     ------  ann, tiss     8 Area       4.52 cm^2   ------  DP Diam         22 mm     ------  LVOT Aorta                          Peak vel,  76.7 cm/s   ------ Root diam,   39 mm     ------  S ED                             VTI, S     20.8 cm     ------ Left atrium                    Stroke vol 94.1 ml     ------ AP dim       41 mm     ------  Stroke     44.4 ml/m^2 ------ AP dim     1.93 cm/m^2 <2.2    index index                          Aortic valve  Peak vel,   351 cm/s   ------                                S                                Mean vel,   271 cm/s   ------                                S                                VTI, S       100 cm     ------                                Mean         32 mm Hg  ------                                gradient,                                S                                Peak         49 mm Hg  ------                                gradient,                                S                                VTI ratio  0.21        ------                                LVOT/AV                                Area, VTI  0.94 cm^2   ------                                Area index 0.44 cm^2/m ------                                (VTI)           ^2  Peak vel   0.22        ------                                ratio,                                LVOT/AV                                Area, Vmax 0.99 cm^2   ------                                Area index 0.47 cm^2/m ------                                (Vmax)          ^2                                Mitral valve                                Peak E vel 82.9 cm/s   ------                                Peak A vel 63.2 cm/s   ------                                Decelerati  236 ms     150-23                                on time                0                                Peak          3 mm Hg  ------                                gradient,                                D                                Peak E/A    1.3        ------                                ratio  Tricuspid valve                                Regurg      240 cm/s   ------                                peak vel                                Peak RV-RA   23 mm Hg  ------                                gradient,                                S                                Systemic veins                                Estimated     5 mm Hg  ------                                CVP                                Right ventricle                                Pressure,    28 mm Hg   <30                                S                                Sa vel,      13 cm/s   ------                                lat ann,                                tiss DP   ------------------------------------------------------------ Prepared and Electronically Authenticated by  Lyman Bishop 2015-01-09T11:47:[id number]   Transesophageal Echocardiography  Patient:    Pape, Parson MR #:       [id number] Study Date: 01/15/2014 Gender:     M Age:        71 Height:     180.3cm Weight:     90.9kg BSA:        2.15m^2 Pt. Status: Room:       [facility]    ADMITTING    Loralie Champagne  ATTENDING    Loralie Champagne  Halina Andreas, [place]  PERFORMING   Aundra Dubin, [place]  Alexandria Lodge, [place]  SONOGRAPHER  Wyatt Mage, RDCS cc:  ------------------------------------------------------------ LV EF: 60% -   65%  ------------------------------------------------------------ Indications:      Aortic stenosis 424.1.  ------------------------------------------------------------ Study Conclusions  - Left ventricle: The cavity size was normal. There was mild   concentric hypertrophy. Systolic function was normal. The   estimated ejection fraction was in the range of 60% to   65%. Wall motion was normal; there were no regional wall   motion abnormalities. - Aortic valve: Bicuspid; severely calcified leaflets. There   was moderate stenosis. AVA 1.0 cm^2 (planimetry). Trivial   regurgitation. Mean gradient: 71mm Hg (S). Peak gradient:   35mm Hg (S). - Aorta: Normal caliber aorta with minimal plaque. - Mitral valve: Moderate regurgitation. - Left atrium: The atrium was mildly dilated. No evidence of   thrombus in the atrial cavity or appendage. - Pulmonary veins: No pulmonary vein systolic flow reversal. - Right ventricle: The cavity size was normal. Systolic   function was normal. - Right atrium: There is a Chiari network present. No   evidence of thrombus in the atrial cavity  or appendage. - Atrial septum: No defect or patent foramen ovale was   identified. Echo contrast study showed no right-to-left   atrial level shunt, at baseline or with provocation. Transesophageal echocardiography.  2D and color Doppler. Height:  Height: 180.3cm. Height: 71in.  Weight:  Weight: 90.9kg. Weight: 200lb.  Body mass index:  BMI: 28kg/m^2. Body surface area:    BSA: 2.73m^2.  Blood pressure: 153/71.  Patient status:  Outpatient.  Location:  Endoscopy.   ------------------------------------------------------------  ------------------------------------------------------------ Left ventricle:  The cavity size was normal. There was mild concentric hypertrophy. Systolic function was normal. The estimated ejection fraction was in the range of 60% to 65%. Wall motion was normal; there were no regional wall motion abnormalities.  ------------------------------------------------------------ Aortic valve:   Bicuspid; severely calcified leaflets. Doppler:   There was moderate stenosis.   AVA 1.0 cm^2 (planimetry).  Trivial regurgitation.    Mean gradient: 6mm Hg (S). Peak gradient: 15mm Hg (S).  ------------------------------------------------------------ Aorta:  Normal caliber aorta with minimal plaque.  ------------------------------------------------------------ Mitral valve:   Mildly calcified leaflets .  Doppler: There was no evidence for stenosis.    Moderate regurgitation.  ------------------------------------------------------------ Left atrium:  The atrium was mildly dilated.  No evidence of thrombus in the atrial cavity or appendage.  ------------------------------------------------------------ Atrial septum:  No defect or patent foramen ovale was identified.  Echo contrast study showed no right-to-left atrial level shunt, at baseline or with provocation.  ------------------------------------------------------------ Pulmonary veins:  No pulmonary vein systolic  flow reversal.   ------------------------------------------------------------ Right ventricle:  The cavity size was normal. Systolic function was normal.  ------------------------------------------------------------ Pulmonic valve:    Structurally normal valve.   Cusp separation was normal.  ------------------------------------------------------------ Tricuspid valve:   Doppler:   Trivial regurgitation.  ------------------------------------------------------------ Right atrium:  There is a Chiari network present. The atrium was normal in size.  No evidence of thrombus in the atrial cavity or appendage.  ------------------------------------------------------------ Pericardium:  There was no pericardial effusion.   ------------------------------------------------------------ Post procedure conclusions Ascending Aorta:  - Normal caliber aorta with minimal plaque.  ------------------------------------------------------------  Doppler measurements   Normal Aortic valve Peak vel, S   348 cm/s ------ Mean vel, S   239 cm/s ------ VTI, S        104 cm   ------  Mean           26 mm   ------ gradient, S       Hg Peak           48 mm   ------ gradient, S       Hg   ------------------------------------------------------------ Prepared and Electronically Authenticated by  Loralie Champagne 2015-01-27T16:00:49.273    Transthoracic Echocardiography  Patient:    Lazerick, Schulze MR #:       [id number] Study Date: 04/12/2014 Gender:     M Age:        81 Height:     180.3cm Weight:     91.6kg BSA:        2.44m^2 Pt. Status: Room:       [facility]    SONOGRAPHER  Delman Kitten, RCS  ADMITTING    Loralie Champagne  ATTENDING    Aundra Dubin, [place]  Halina Andreas, [place]  REFERRING    Aundra Dubin, [place]  PERFORMING   [facility], Inpatient cc:  ------------------------------------------------------------ LV EF: 60% -    65%  ------------------------------------------------------------ Indications:      Aortic stenosis 424.1. Limited to reassess aortic valve post cath.  ------------------------------------------------------------ History:   PMH:   Chest pain.  Murmur.  Coronary artery disease.  Risk factors:  Former tobacco use. Dyslipidemia.   ------------------------------------------------------------ Study Conclusions  - Left ventricle: The cavity size was normal. Wall thickness   was increased in a pattern of moderate LVH. Systolic   function was normal. The estimated ejection fraction was   in the range of 60% to 65%. Wall motion was normal; there   were no regional wall motion abnormalities. - Aortic valve: There was moderate to severe stenosis. Valve   area: 0.67cm^2(VTI). Valve area: 0.71cm^2 (Vmax). - Mitral valve: Mild to moderate regurgitation. Transthoracic echocardiography.  M-mode, limited 2D, limited spectral Doppler, and color Doppler.  Height:  Height: 180.3cm. Height: 71in.  Weight:  Weight: 91.6kg. Weight: 201.5lb.  Body mass index:  BMI: 28.2kg/m^2.  Body surface area:    BSA: 2.61m^2.  Blood pressure:     141/66.  Patient status:  Inpatient.  Location:  Bedside.  ------------------------------------------------------------  ------------------------------------------------------------ Left ventricle:  The cavity size was normal. Wall thickness was increased in a pattern of moderate LVH. Systolic function was normal. The estimated ejection fraction was in the range of 60% to 65%. Wall motion was normal; there were no regional wall motion abnormalities.  ------------------------------------------------------------ Aortic valve:   Moderately thickened, moderately calcified leaflets.  Doppler:   There was moderate to severe stenosis.    No significant regurgitation.    VTI ratio of LVOT to aortic valve: 0.21. Valve area: 0.67cm^2(VTI). Indexed valve area: 0.31cm^2/m^2 (VTI).  Peak velocity ratio of LVOT to aortic valve: 0.23. Valve area: 0.71cm^2 (Vmax). Indexed valve area: 0.33cm^2/m^2 (Vmax).    Mean gradient: 6mm Hg (S). Peak gradient: 65mm Hg (S).  ------------------------------------------------------------ Aorta:  Aortic root: The aortic root was normal in size. Ascending aorta: The ascending aorta was normal in size.  ------------------------------------------------------------ Mitral valve:   The valve appears to be grossly normal. Doppler:   Mild to moderate regurgitation.  ------------------------------------------------------------ Left atrium:  The atrium was normal in size.  ------------------------------------------------------------ Pulmonic valve:    The valve appears to be grossly normal.  Doppler:   No significant regurgitation.  ------------------------------------------------------------  2D measurements        Normal  Doppler measurements   Norma Left ventricle  l LVID ED,   42.2 mm     43-52   LVOT chord,                         Peak vel, 85. cm/s     ----- PLAX                           S           7 LVID ES,   24.5 mm     23-38   VTI, S    23. cm       ----- chord,                                     2 PLAX                           Aortic valve FS, chord,   42 %      >29     Peak vel, 377 cm/s     ----- PLAX                           S LVPW, ED   15.4 mm     ------  Mean vel, 279 cm/s     ----- IVS/LVPW   1.02        <1.3    S ratio, ED                      VTI, S    109 cm       ----- Ventricular septum             Mean       35 mm Hg    ----- IVS, ED    15.7 mm     ------  gradient, LVOT                           S Diam, S      20 mm     ------  Peak       57 mm Hg    ----- Area       3.14 cm^2   ------  gradient, Aorta                          S Root diam,   35 mm     ------  VTI ratio 0.2          ----- ED                             LVOT/AV     1 Left atrium                     Area, VTI 0.6 cm^2     ----- AP dim       34 mm     ------              7 AP dim     1.57 cm/m^2 <2.2    Area      0.3 cm^2/m^2 ----- index  index       1                                (VTI)                                Peak vel  0.2          -----                                ratio,      3                                LVOT/AV                                Area,     0.7 cm^2     -----                                Vmax        1                                Area      0.3 cm^2/m^2 -----                                index       3                                (Vmax)   ------------------------------------------------------------ Prepared and Electronically Authenticated by  Kristeen Miss [id number]        Cardiac Catheterization Procedure Note  Name: CORIE POUR MRN: [id number] DOB: [birth year]  Procedure:  Selective Coronary Angiography  Indication: Angina in patient with known CAD.                                     Procedural Details: The right wrist was prepped, draped, and anesthetized with 1% lidocaine. Using the modified Seldinger technique, a 5 French sheath was introduced into the right radial artery. 3 mg of verapamil was administered through the sheath, weight-based unfractionated heparin was administered intravenously. Standard Judkins catheters were used for selective coronary angiography. Catheter exchanges were performed over an exchange length guidewire. There were no immediate procedural complications. The patient will get FFR of LAD lesion with Dr. Clifton James.  Procedural Findings: Hemodynamics: AO 117/56  Coronary angiography: Coronary dominance: right  Left mainstem: 30% mid LM stenosis.   Left anterior descending (LAD): Long stented segment of the proximal to mid LAD.  There was approximately 70% in-stent restenosis in the mid LAD at D2.  The D2 was a small to moderate vessel with 90% ostial stenosis.     Left circumflex (LCx): Large ramus with luminal irregularities.  Small AV LCx with luminal irregularities.    Right coronary artery (RCA): Luminal irregularities.  Left ventriculography: Not done, AS with recent TEE and TTE.    Final Conclusions:  Patient has known at least moderate AS and has had recurrent LAD disease.  Patient has borderline mid LAD in-stent restenosis today with 90% stenosis in the ostial D2 (small to moderate vessel).  Symptoms could potentially come from D2.  Will plan FFR of LAD now.  If stenosis looks significant, would consider LIMA-LAD with AVR given recurrent LAD in-stent restenosis and at least moderate AS.  If LAD stenosis does not look significant, angina may be coming from D2.  In that situation, would add Imdur 30 mg daily to medical regimen.  Either way, will get limited echo in recovery to reassess aortic valve gradient.   Larey Dresser 04/12/2014, 8:26 AM   FFR 0.74 so significant LAD lesion.  Moderate-severe AS on echo.  Will refer to CVTS for evaluation for LIMA-AVR.  Will not use Imdur.   Larey Dresser 04/12/2014 5:29 PM   MRA CHEST WITH OR WITHOUT CONTRAST   GE 1.5 T magnet with dedicated cardiac coil.  Limited FIESTA sequences done to assess aortic valve.  20 mL Multihance contrast was given followed by MR angiography to assess the thoracic aorta.   Contrast: 20 mL Multihance.   Comparison: None.   Findings: The aortic valve was bicuspid with evidence for aortic stenosis.  Gradient was measured on recent echo.  The thoracic aorta was normal in caliber.  The aortic arch vessels originated normally.  The pulmonary artery was normal in caliber.  The pulmonary veins drained normally to the left atrium.   Measurements:   Aortic root 3.5 cm   Ascending aorta 3.3 cm   Aortic arch 2.6 cm   Proximal descending thoracic aorta 2.5 cm   IMPRESSION: 1. Bicuspid aortic valve with aortic stenosis.   2.  Normal caliber thoracic aorta.    Original Report Authenticated By: [id number]    Impression:  Patient has bicuspid aortic valve with moderate to severe aortic stenosis and severe single-vessel coronary artery disease with progressive symptoms of exertional angina, CCS functional class III.  He does not have symptoms of congestive heart failure.  Left ventricular function remains preserved. The patient is otherwise fairly healthy and appears to be reasonably good candidate for surgery. I personally reviewed the patient's recent echocardiograms and diagnostic cardiac catheterization. I agree that he would best be treated with coronary artery bypass grafting and aortic valve replacement.   Plan:  The patient and his wife were counseled at length regarding treatment options regarding management of both coronary artery disease and aortic stenosis.  Rationale for surgical intervention was discussed at length and compared with continued medical therapy with or without repeat PCI and stenting of the left anterior descending coronary artery. All of her questions been addressed. We tentatively plan to proceed with surgery on Wednesday, 05/01/2014. The patient will return for followup and further preoperative counseling on Monday, 04/29/2014. He has been instructed to stop taking Plavix in anticipation of surgery. He will continue to use sublingual nitroglycerin as needed should he develop chest discomfort occurring at rest, and if symptoms are not probably resolved he will go directly to the emergency room. All of his questions have been addressed.  I spent in excess of 90 minutes during the conduct of this office consultation and >50% of this time involved direct face-to-face encounter with the patient for counseling and/or coordination of their care.   Valentina Gu. Roxy Manns, MD 04/15/2014 7:51 PM

## 2014-04-15 NOTE — Patient Instructions (Signed)
Stop taking Plavix 

## 2014-04-16 ENCOUNTER — Other Ambulatory Visit: Payer: Self-pay | Admitting: *Deleted

## 2014-04-16 ENCOUNTER — Telehealth: Payer: Self-pay | Admitting: Cardiology

## 2014-04-16 DIAGNOSIS — I359 Nonrheumatic aortic valve disorder, unspecified: Secondary | ICD-10-CM

## 2014-04-16 DIAGNOSIS — I2510 Atherosclerotic heart disease of native coronary artery without angina pectoris: Secondary | ICD-10-CM

## 2014-04-16 NOTE — Telephone Encounter (Signed)
Lab results provided when patient called back.  See result note.

## 2014-04-16 NOTE — Telephone Encounter (Signed)
New message  ° ° °patient calling for test results  °

## 2014-04-16 NOTE — Telephone Encounter (Signed)
Lm to call for lab results.

## 2014-04-18 ENCOUNTER — Encounter (HOSPITAL_COMMUNITY): Payer: Self-pay | Admitting: Pharmacy Technician

## 2014-04-27 NOTE — Pre-Procedure Instructions (Signed)
[place] - Preparing for Surgery  Before surgery, you can play an important role.  Because skin is not sterile, your skin needs to be as free of germs as possible.  You can reduce the number of germs on you skin by washing with CHG (chlorahexidine gluconate) soap before surgery.  CHG is an antiseptic cleaner which kills germs and bonds with the skin to continue killing germs even after washing.  Please DO NOT use if you have an allergy to CHG or antibacterial soaps.  If your skin becomes reddened/irritated stop using the CHG and inform your nurse when you arrive at Short Stay.  Do not shave (including legs and underarms) for at least 48 hours prior to the first CHG shower.  You may shave your face.  Please follow these instructions carefully:   1.  Shower with CHG Soap the night before surgery and the morning of Surgery.  2.  If you choose to wash your hair, wash your hair first as usual with your normal shampoo.  3.  After you shampoo, rinse your hair and body thoroughly to remove the shampoo.  4.  Use CHG as you would any other liquid soap.  You can apply CHG directly to the skin and wash gently with scrungie or a clean washcloth.  5.  Apply the CHG Soap to your body ONLY FROM THE NECK DOWN.  Do not use on open wounds or open sores.  Avoid contact with your eyes, ears, mouth and genitals (private parts).  Wash genitals (private parts) with your normal soap.  6.  Wash thoroughly, paying special attention to the area where your surgery will be performed.  7.  Thoroughly rinse your body with warm water from the neck down.  8.  DO NOT shower/wash with your normal soap after using and rinsing off the CHG Soap.  9.  Pat yourself dry with a clean towel.            10.  Wear clean pajamas.            11.  Place clean sheets on your bed the night of your first shower and do not sleep with pets.  Day of Surgery  Do not apply any lotions the morning of surgery.  Please wear clean clothes to the  hospital/surgery center.   

## 2014-04-27 NOTE — Pre-Procedure Instructions (Signed)
Travis Armstrong  04/27/2014   Your procedure is scheduled on:  May 13  Report to [facility] Admitting at 05:30 AM.  Call this number if you have problems the morning of surgery: [phone]   Remember:   Do not eat food or drink liquids after midnight.   Take these medicines the morning of surgery with A SIP OF WATER: Flonase, Claritin, Metoprolol,   STOP Plavix, Fish Oil, Magnesium, Calcium, Aspirin today   STOP/ Do not take Aspirin, Aleve, Naproxen, Advil, Ibuprofen, Vitamin, Herbs, or Supplements starting today   Do not wear jewelry, make-up or nail polish.  Do not wear lotions, powders, or perfumes. You may wear deodorant.  Do not shave 48 hours prior to surgery. Men may shave face and neck.  Do not bring valuables to the hospital.  [facility] is not responsible for any belongings or valuables.               Contacts, dentures or bridgework may not be worn into surgery.  Leave suitcase in the car. After surgery it may be brought to your room.  For patients admitted to the hospital, discharge time is determined by your treatment team.               Special Instructions: See [facility] Preparing For Surgery   Please read over the following fact sheets that you were given: Pain Booklet, Coughing and Deep Breathing, Blood Transfusion Information, Open Heart Packet and Surgical Site Infection Prevention

## 2014-04-29 ENCOUNTER — Ambulatory Visit (INDEPENDENT_AMBULATORY_CARE_PROVIDER_SITE_OTHER): Payer: Medicare Other | Admitting: Thoracic Surgery (Cardiothoracic Vascular Surgery)

## 2014-04-29 ENCOUNTER — Encounter (HOSPITAL_COMMUNITY): Payer: Self-pay

## 2014-04-29 ENCOUNTER — Encounter (HOSPITAL_COMMUNITY)
Admission: RE | Admit: 2014-04-29 | Discharge: 2014-04-29 | Disposition: A | Discharge status: Home / Self Care | Payer: Medicare Other | Source: Ambulatory Visit | Attending: Thoracic Surgery (Cardiothoracic Vascular Surgery) | Admitting: Thoracic Surgery (Cardiothoracic Vascular Surgery)

## 2014-04-29 ENCOUNTER — Ambulatory Visit (HOSPITAL_COMMUNITY)
Admission: RE | Admit: 2014-04-29 | Discharge: 2014-04-29 | Disposition: A | Discharge status: Home / Self Care | Payer: Medicare Other | Source: Ambulatory Visit | Attending: Thoracic Surgery (Cardiothoracic Vascular Surgery) | Admitting: Thoracic Surgery (Cardiothoracic Vascular Surgery)

## 2014-04-29 ENCOUNTER — Ambulatory Visit: Payer: Medicare Other | Admitting: Cardiology

## 2014-04-29 ENCOUNTER — Encounter: Payer: Self-pay | Admitting: Thoracic Surgery (Cardiothoracic Vascular Surgery)

## 2014-04-29 VITALS — BP 157/67 | HR 50 | Resp 20 | Ht 71.0 in | Wt 203.0 lb

## 2014-04-29 VITALS — BP 148/83 | HR 52 | Temp 97.8°F | Ht 71.0 in | Wt 203.5 lb

## 2014-04-29 DIAGNOSIS — Z01810 Encounter for preprocedural cardiovascular examination: Secondary | ICD-10-CM

## 2014-04-29 DIAGNOSIS — I2510 Atherosclerotic heart disease of native coronary artery without angina pectoris: Secondary | ICD-10-CM

## 2014-04-29 DIAGNOSIS — I359 Nonrheumatic aortic valve disorder, unspecified: Secondary | ICD-10-CM

## 2014-04-29 DIAGNOSIS — I350 Nonrheumatic aortic (valve) stenosis: Secondary | ICD-10-CM

## 2014-04-29 DIAGNOSIS — Z01812 Encounter for preprocedural laboratory examination: Secondary | ICD-10-CM | POA: Insufficient documentation

## 2014-04-29 DIAGNOSIS — Z01818 Encounter for other preprocedural examination: Secondary | ICD-10-CM | POA: Insufficient documentation

## 2014-04-29 DIAGNOSIS — Q231 Congenital insufficiency of aortic valve: Secondary | ICD-10-CM

## 2014-04-29 HISTORY — DX: Unspecified osteoarthritis, unspecified site: M19.90

## 2014-04-29 HISTORY — DX: Angina pectoris, unspecified: I20.9

## 2014-04-29 LAB — COMPREHENSIVE METABOLIC PANEL
ALBUMIN: 3.8 g/dL (ref 3.5–5.2)
ALT: 24 U/L (ref 0–53)
AST: 33 U/L (ref 0–37)
Alkaline Phosphatase: 95 U/L (ref 39–117)
BUN: 13 mg/dL (ref 6–23)
CALCIUM: 9.3 mg/dL (ref 8.4–10.5)
CO2: 23 mEq/L (ref 19–32)
CREATININE: 1 mg/dL (ref 0.50–1.35)
Chloride: 105 mEq/L (ref 96–112)
GFR calc Af Amer: 84 mL/min — ABNORMAL LOW (ref >90)
GFR, EST NON AFRICAN AMERICAN: 73 mL/min — AB (ref >90)
Glucose, Bld: 104 mg/dL — ABNORMAL HIGH (ref 70–99)
Potassium: 4.8 mEq/L (ref 3.7–5.3)
Sodium: 139 mEq/L (ref 137–147)
Total Bilirubin: 0.4 mg/dL (ref 0.3–1.2)
Total Protein: 6.5 g/dL (ref 6.0–8.3)

## 2014-04-29 LAB — URINE MICROSCOPIC-ADD ON

## 2014-04-29 LAB — SURGICAL PCR SCREEN
MRSA, PCR: POSITIVE — AB
Staphylococcus aureus: POSITIVE — AB

## 2014-04-29 LAB — CBC
HCT: 37.5 % — ABNORMAL LOW (ref 39.0–52.0)
Hemoglobin: 13.1 g/dL (ref 13.0–17.0)
MCH: 32.2 pg (ref 26.0–34.0)
MCHC: 34.9 g/dL (ref 30.0–36.0)
MCV: 92.1 fL (ref 78.0–100.0)
Platelets: 138 10*3/uL — ABNORMAL LOW (ref 150–400)
RBC: 4.07 MIL/uL — ABNORMAL LOW (ref 4.22–5.81)
RDW: 13.1 % (ref 11.5–15.5)
WBC: 6.9 10*3/uL (ref 4.0–10.5)

## 2014-04-29 LAB — BLOOD GAS, ARTERIAL
Acid-Base Excess: 1.5 mmol/L (ref 0.0–2.0)
Bicarbonate: 25.6 mEq/L — ABNORMAL HIGH (ref 20.0–24.0)
Drawn by: 181601
FIO2: 0.21 %
O2 Saturation: 98.5 %
PATIENT TEMPERATURE: 98.6
PCO2 ART: 41 mmHg (ref 35.0–45.0)
PH ART: 7.412 (ref 7.350–7.450)
PO2 ART: 107 mmHg — AB (ref 80.0–100.0)
TCO2: 26.9 mmol/L (ref 0–100)

## 2014-04-29 LAB — URINALYSIS, ROUTINE W REFLEX MICROSCOPIC
BILIRUBIN URINE: NEGATIVE
GLUCOSE, UA: NEGATIVE mg/dL
Ketones, ur: NEGATIVE mg/dL
Leukocytes, UA: NEGATIVE
Nitrite: NEGATIVE
Protein, ur: NEGATIVE mg/dL
SPECIFIC GRAVITY, URINE: 1.011 (ref 1.005–1.030)
UROBILINOGEN UA: 0.2 mg/dL (ref 0.0–1.0)
pH: 6 (ref 5.0–8.0)

## 2014-04-29 LAB — TYPE AND SCREEN
ABO/RH(D): O NEG
ANTIBODY SCREEN: NEGATIVE

## 2014-04-29 LAB — PROTIME-INR
INR: 0.94 (ref 0.00–1.49)
PROTHROMBIN TIME: 12.4 s (ref 11.6–15.2)

## 2014-04-29 LAB — HEMOGLOBIN A1C
Hgb A1c MFr Bld: 5.7 % — ABNORMAL HIGH (ref <5.7)
MEAN PLASMA GLUCOSE: 117 mg/dL — AB (ref <117)

## 2014-04-29 LAB — ABO/RH: ABO/RH(D): O NEG

## 2014-04-29 LAB — APTT: aPTT: 34 seconds (ref 24–37)

## 2014-04-29 NOTE — Patient Instructions (Signed)
Nothing to eat or drink after midnight the night before surgery.   On the morning of surgery take only your metoprolol with a sip of water

## 2014-04-29 NOTE — Progress Notes (Signed)
Primary physician - dr. Redmond Pulling Cardiologist - dr. Aundra Dubin Cath in epic, ekg in epic, tee echo in epic

## 2014-04-29 NOTE — H&P (Addendum)
[address]       ,[place] [address]             [phone]          CARDIOTHORACIC SURGERY HISTORY AND PHYSICAL EXAM  Referring Provider is [facility], Elby Showers, MD PCP is Woody Seller, MD    Chief Complaint   Patient presents with   .  Aortic Stenosis       Surgical eval, Cardiac Cath and ECHO 04/12/14- Dr.Mclean   .  Coronary Artery Disease     HPI:  Patient is a 73 year old retired white male from [place] referred for possible surgical treatment of bicuspid aortic valve with aortic stenosis and severe single-vessel coronary artery disease. The patient's cardiac history dates back to 20012 when he presented with symptoms of exertional angina.  He was noted to have a heart murmur on physical exam but echocardiogram revealed a bicuspid aortic valve but only mild aortic stenosis.  MRA of the chest was notable for the absence of significant aneurysmal enlargement of the ascending thoracic aorta. The patient underwent cardiac catheterization and was found to have high-grade stenosis of the left anterior descending coronary artery. He was treated with PCI and stenting using a drug-eluting stent. He did well for a period of time, but he develop recurrent angina in October of 2013. Followup catheterization revealed restenosis in the proximal left anterior descending coronary artery. He underwent repeat PCI and stenting with another drug-eluting stent. The patient has continued to follow closely with Dr. Aundra Dubin, and over the past 2 years he has demonstrated progression of severity of aortic stenosis. Followup transthoracic and transesophageal echocardiogram performed in January of this year demonstrates what appears to be bicuspid native aortic valve with at least moderate aortic stenosis with preserved LV systolic function.  Over the past 2 months the patient has developed recurrent symptoms of exertional chest tightness consistent with angina pectoris. He has also  experienced increasing fatigue, but he continues to deny symptoms of exertional shortness of breath. He develops chest tightness with walking or strenuous exertion, but symptoms are typically resolve quickly by rest. He denies any history PND, orthopnea, lower extremity edema, tachypalpitations, dizzy spells, or syncope.  He subsequently underwent left and right heart catheterization last week. This revealed restenosis of the mid left anterior descending coronary artery within the previous stents. There has not been any progression of other significant coronary artery disease. The patient was referred for elective surgical consultation. Since he underwent catheterization he has had some accelerations symptoms of angina pectoris, but all episodes of chest pain and been transient and relieved by rest. He has not had to take any sublingual nitroglycerin.  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 He lives in [place] with his wife. He remains active physically and enjoys playing golf as well as working in their yard.  He has some arthritis involving his lower back and right hip, but this does not limit his mobility to much. He has no other significant physical limitations.    Past Medical History   Diagnosis  Date   .  Keratosis, actinic     .  Muscle strain     .  HTN (hypertension)     .  CAD (coronary artery disease)     .  Hyperlipidemia     .  Pneumonia  1958   .  Bradycardia, sinus     .  Aortic stenosis     .  Bicuspid aortic valve         Past Surgical History   Procedure  Laterality  Date   .  Coronary angioplasty with stent placement    11/23/2011       DES mid LAD   .  Coronary angioplasty with stent placement    10/02/2012       DES prox LAD   .  Inguinal hernia repair    ~ 2007       left   .  Tee without cardioversion  N/A  01/15/2014       Procedure: TRANSESOPHAGEAL ECHOCARDIOGRAM (TEE);  Surgeon: Larey Dresser, MD;  Location: [facility] ENDOSCOPY;  Service:  Cardiovascular;  Laterality: N/A;       Family History   Problem  Relation  Age of Onset   .  Coronary artery disease  Brother     .  Colon cancer  Neg Hx     .  Esophageal cancer  Neg Hx     .  Stomach cancer  Neg Hx     .  Rectal cancer  Neg Hx         History      Social History   .  Marital Status:  Married       Spouse Name:  N/A       Number of Children:  N/A   .  Years of Education:  N/A      Occupational History   .  Not on file.      Social History Main Topics   .  Smoking status:  Former Smoker -- 1.00 packs/day for 8 years       Types:  Cigarettes       Quit date:  03/28/1972   .  Smokeless tobacco:  Never Used   .  Alcohol Use:  No         Comment: "last alcohol was ~ 1990's"   .  Drug Use:  No   .  Sexual Activity:  Not Currently         Comment: "stopped smoking cigarettes 1973"      Other Topics  Concern   .  Not on file      Social History Narrative     Lives in [place] with his wife.  Retired.       Current Outpatient Prescriptions   Medication  Sig  Dispense  Refill   .  aspirin 81 MG tablet  Take 81 mg by mouth daily.           Marland Kitchen  atorvastatin (LIPITOR) 40 MG tablet  Take 20 mg by mouth at bedtime.          Marland Kitchen  CALCIUM PO  Take 500 mg by mouth daily.         .  clopidogrel (PLAVIX) 75 MG tablet  Take 1 tablet (75 mg total) by mouth daily.   90 tablet   3   .  fluticasone (FLONASE) 50 MCG/ACT nasal spray  Place 2 sprays into both nostrils daily.          Marland Kitchen  lisinopril (PRINIVIL,ZESTRIL) 10 MG tablet  Take 1 tablet (10 mg total) by mouth daily.   30 tablet   11   .  loratadine (CLARITIN) 10 MG tablet  Take 10 mg by mouth daily.           .  magnesium gluconate (MAGONATE) 500 MG tablet  Take 500 mg by  mouth daily.           .  metoprolol tartrate (LOPRESSOR) 25 MG tablet  Take 12.5 mg by mouth 2 (two) times daily.          .  nitroGLYCERIN (NITROSTAT) 0.4 MG SL tablet  Place 1 tablet (0.4 mg total) under the tongue every 5 (five) minutes as  needed for chest pain.   25 tablet   3   .  Omega-3 Fatty Acids (FISH OIL PO)  Take 1 capsule by mouth daily.             No current facility-administered medications for this visit.       Allergies   Allergen  Reactions   .  Penicillins         RASH ON FEET       Review of Systems:              General:                      normal appetite, decreased energy, no weight gain, no weight loss, no fever             Cardiac:                      + chest pain with exertion, no chest pain at rest, no SOB with exertion, no resting SOB, no PND, no orthopnea, no palpitations, no arrhythmia, no atrial fibrillation, no LE edema, no dizzy spells, no syncope             Respiratory:                no shortness of breath, no home oxygen, no productive cough, no dry cough, no bronchitis, no wheezing, no hemoptysis, no asthma, no pain with inspiration or cough, no sleep apnea, no CPAP at night             GI:                                no difficulty swallowing, no reflux, no frequent heartburn, no hiatal hernia, no abdominal pain, no constipation, no diarrhea, no hematochezia, no hematemesis, no melena             GU:                              no dysuria,  no frequency, no urinary tract infection, no hematuria, + enlarged prostate, no kidney stones, no kidney disease             Vascular:                     no pain suggestive of claudication, no pain in feet, no leg cramps, no varicose veins, no DVT, no non-healing foot ulcer             Neuro:                         no stroke, no TIA's, no seizures, no headaches, no temporary blindness one eye,  no slurred speech, no peripheral neuropathy, no chronic pain, no instability of gait, no memory/cognitive dysfunction             Musculoskeletal:         + arthritis,  no joint swelling, no myalgias, no difficulty walking, normal mobility               Skin:                            no rash, no itching, no skin infections, no pressure sores or  ulcerations             Psych:                         no anxiety, no depression, no nervousness, no unusual recent stress             Eyes:                           no blurry vision, no floaters, no recent vision changes, + wears glasses or contacts             ENT:                            no hearing loss, no loose or painful teeth, no dentures, last saw dentist within the past year             Hematologic:               + easy bruising, + abnormal bleeding, no clotting disorder, no frequent epistaxis             Endocrine:                   no diabetes, does not check CBG's at home                           Physical Exam:              BP 136/73  Pulse 60  Resp 20  Ht 5\' 11"  (1.803 m)  Wt 202 lb (91.627 kg)  BMI 28.19 kg/m2  SpO2 98%             General:                        well-appearing             HEENT:                       Unremarkable               Neck:                           no JVD, no bruits, no adenopathy               Chest:                         clear to auscultation, symmetrical breath sounds, no wheezes, no rhonchi               CV:                              RRR, grade III/VI crescendo/decrescendo systolic murmur  Abdomen:                    soft, non-tender, no masses               Extremities:                 warm, well-perfused, pulses diminished but palpable, no LE edema             Rectal/GU                   Deferred             Neuro:                         Grossly non-focal and symmetrical throughout             Skin:                            Clean and dry, no rashes, no breakdown   Diagnostic Tests:  Transthoracic Echocardiography  Patient:    Travis Armstrong, Travis Armstrong MR #:       [id number] Study Date: 12/28/2013 Gender:     M Age:        72 Height:     180.3cm Weight:     92.1kg BSA:        2.64m^2 Pt. Status: Room:    ATTENDING    Audria Nine, [place]  Darin Engels, [place]  SONOGRAPHER  Samule Ohm  PERFORMING   [facility], Outpatient cc:  ------------------------------------------------------------ LV EF: 60% -   65%  ------------------------------------------------------------ Indications:      424.1 Aortic valve disorders.  ------------------------------------------------------------ History:   PMH:  Bicuspid aortic valve. Acquired from the patient and from the patient's chart.  Coronary artery disease.  Aortic stenosis.  Aortic valve disease.  Risk factors:  Hypertension. Dyslipidemia.  ------------------------------------------------------------ Study Conclusions  - Left ventricle: The cavity size was normal. There was   moderate concentric hypertrophy. Systolic function was   normal. The estimated ejection fraction was in the range   of 60% to 65%. Wall motion was normal; there were no   regional wall motion abnormalities. Doppler parameters are   consistent with abnormal left ventricular relaxation   (grade 1 diastolic dysfunction). The E/e' ratio is >10,   suggesting elevated LV filling pressure. - Aortic valve: Heavily calcified bicuspid (or functionally   bicuspid) aortic valve. Peak and mean gradients of 55 mmHg   and 36 mmHg, respectively. Based on an LVOT diameter of   2.4 cm, the calculated AVA is0.9 - 1.0cm2. This is   consistent with moderate to severe aortic stenosis. No   regurgitation. - Aorta: No suggestion of significant coarctation. - Aortic root: The aortic root was top normal in size. - Mitral valve: Posterior calcified annulus. Mild thickening   of the anterior leaflet. Mild regurgitation. - Left atrium: The atrium was mildly dilated (21 cm2). - Right atrium: The atrium was mildly dilated (18.5 cm2). - Tricuspid valve: Mildly thickened leaflets. Mild   regurgitation. RVSP could not be estimated due to lack of   TR jet. - Systemic veins: IVC was not well visualized. - Pericardium, extracardiac: There was no pericardial    effusion. Transthoracic echocardiography.  M-mode, complete 2D, spectral Doppler, and color Doppler.  Height:  Height: 180.3cm. Height: 71in.  Weight:  Weight: 92.1kg. Weight: 202.6lb.  Body mass index:  BMI: 28.3kg/m^2.  Body surface area:    BSA: 2.37m^2.  Blood pressure:     124/64.  Patient status:  Outpatient.  Location:  [facility]  ------------------------------------------------------------  ------------------------------------------------------------ Left ventricle:  The cavity size was normal. There was moderate concentric hypertrophy. Systolic function was normal. The estimated ejection fraction was in the range of 60% to 65%. Wall motion was normal; there were no regional wall motion abnormalities. Doppler parameters are consistent with abnormal left ventricular relaxation (grade 1 diastolic dysfunction). The E/e' ratio is >10, suggesting elevated LV filling pressure.  ------------------------------------------------------------ Aortic valve:  Heavily calcified bicuspid (or functionally bicuspid) aortic valve. Peak and mean gradients of 55 mmHg and 36 mmHg, respectively. Based on an LVOT diameter of 2.4 cm, the calculated AVA is0.9 - 1.0cm2. This is consistent with moderate to severe aortic stenosis.  Doppler:   No regurgitation.    VTI ratio of LVOT to aortic valve: 0.21. Valve area: 0.94cm^2(VTI). Indexed valve area: 0.44cm^2/m^2 (VTI). Peak velocity ratio of LVOT to aortic valve: 0.22. Valve area: 0.99cm^2 (Vmax). Indexed valve area: 0.47cm^2/m^2 (Vmax).    Mean gradient: 77mm Hg (S). Peak gradient: 49mm Hg (S).  ------------------------------------------------------------ Aorta:  No suggestion of significant coarctation. Aortic root: The aortic root was top normal in size. Ascending aorta: The ascending aorta was normal in size.  ------------------------------------------------------------ Mitral valve:  Posterior calcified annulus. Mild thickening of  the anterior leaflet. Mild regurgitation.  Doppler: Peak gradient: 36mm Hg (D).  ------------------------------------------------------------ Left atrium:  The atrium was mildly dilated (21 cm2).   ------------------------------------------------------------ Right ventricle:  The cavity size was normal. Wall thickness was normal. The moderator band was prominent. Systolic function was normal.  ------------------------------------------------------------ Pulmonic valve:    The valve appears to be grossly normal.  Doppler:   Mild regurgitation.  ------------------------------------------------------------ Tricuspid valve:   Mildly thickened leaflets.  Doppler: Mild regurgitation. RVSP could not be estimated due to lack of TR jet.  ------------------------------------------------------------ Pulmonary artery:   The main pulmonary artery was normal-sized.  ------------------------------------------------------------ Right atrium:  The atrium was mildly dilated (18.5 cm2).  ------------------------------------------------------------ Pericardium:  There was no pericardial effusion.  ------------------------------------------------------------ Systemic veins:  IVC was not well visualized.   ------------------------------------------------------------ Post procedure conclusions Ascending Aorta:  - No suggestion of significant coarctation.  ------------------------------------------------------------  2D measurements        Normal  Doppler measurements   Normal Left ventricle                 Main pulmonary LVID ED,     41 mm     43-52   artery chord,                         Pressure,    28 mm Hg  =30 PLAX                           S LVID ES,   27.4 mm     23-38   Left ventricle chord,                         Ea, lat    5.85 cm/s   ------ PLAX                           ann, tiss FS, chord,  33 %      >29     DP PLAX                           E/Ea, lat  14.1         ------ LVPW, ED     14 mm     ------  ann, tiss     7 IVS/LVPW   1.06        <1.3    DP ratio, ED                      Ea, med    6.34 cm/s   ------ Ventricular septum             ann, tiss IVS, ED    14.8 mm     ------  DP LVOT                           E/Ea, med  13.0        ------ Diam, S      24 mm     ------  ann, tiss     8 Area       4.52 cm^2   ------  DP Diam         22 mm     ------  LVOT Aorta                          Peak vel,  76.7 cm/s   ------ Root diam,   39 mm     ------  S ED                             VTI, S     20.8 cm     ------ Left atrium                    Stroke vol 94.1 ml     ------ AP dim       41 mm     ------  Stroke     44.4 ml/m^2 ------ AP dim     1.93 cm/m^2 <2.2    index index                          Aortic valve                                Peak vel,   351 cm/s   ------                                S                                Mean vel,   271 cm/s   ------                                S  VTI, S      100 cm     ------                                Mean         32 mm Hg  ------                                gradient,                                S                                Peak         49 mm Hg  ------                                gradient,                                S                                VTI ratio  0.21        ------                                LVOT/AV                                Area, VTI  0.94 cm^2   ------                                Area index 0.44 cm^2/m ------                                (VTI)           ^2                                Peak vel   0.22        ------                                ratio,                                LVOT/AV                                Area, Vmax 0.99 cm^2   ------  Area index 0.47 cm^2/m ------                                (Vmax)          ^2                                Mitral valve                                 Peak E vel 82.9 cm/s   ------                                Peak A vel 63.2 cm/s   ------                                Decelerati  236 ms     150-23                                on time                0                                Peak          3 mm Hg  ------                                gradient,                                D                                Peak E/A    1.3        ------                                ratio                                Tricuspid valve                                Regurg      240 cm/s   ------                                peak vel                                Peak RV-RA   23 mm Hg  ------  gradient,                                S                                Systemic veins                                Estimated     5 mm Hg  ------                                CVP                                Right ventricle                                Pressure,    28 mm Hg  <30                                S                                Sa vel,      13 cm/s   ------                                lat ann,                                tiss DP   ------------------------------------------------------------ Prepared and Electronically Authenticated by  Lyman Bishop 2015-01-09T11:47:[id number]   Transesophageal Echocardiography  Patient:    Travis Armstrong, Travis Armstrong MR #:       [id number] Study Date: 01/15/2014 Gender:     M Age:        70 Height:     180.3cm Weight:     90.9kg BSA:        2.35m^2 Pt. Status: Room:       [facility]    ADMITTING    Loralie Champagne  ATTENDING    Aundra Dubin, [place]  Halina Andreas, [place]  PERFORMING   Aundra Dubin, [place]  Alexandria Lodge, [place]  SONOGRAPHER  Wyatt Mage, RDCS cc:  ------------------------------------------------------------ LV EF: 60% -   65%  ------------------------------------------------------------ Indications:      Aortic  stenosis 424.1.  ------------------------------------------------------------ Study Conclusions  - Left ventricle: The cavity size was normal. There was mild   concentric hypertrophy. Systolic function was normal. The   estimated ejection fraction was in the range of 60% to   65%. Wall motion was normal; there were no regional wall   motion abnormalities. - Aortic valve: Bicuspid; severely calcified leaflets. There   was moderate stenosis. AVA 1.0 cm^2 (planimetry). Trivial   regurgitation. Mean gradient: 15mm Hg (S). Peak gradient:   14mm Hg (S). - Aorta: Normal caliber aorta with minimal plaque. - Mitral valve: Moderate regurgitation. -  Left atrium: The atrium was mildly dilated. No evidence of   thrombus in the atrial cavity or appendage. - Pulmonary veins: No pulmonary vein systolic flow reversal. - Right ventricle: The cavity size was normal. Systolic   function was normal. - Right atrium: There is a Chiari network present. No   evidence of thrombus in the atrial cavity or appendage. - Atrial septum: No defect or patent foramen ovale was   identified. Echo contrast study showed no right-to-left   atrial level shunt, at baseline or with provocation. Transesophageal echocardiography.  2D and color Doppler. Height:  Height: 180.3cm. Height: 71in.  Weight:  Weight: 90.9kg. Weight: 200lb.  Body mass index:  BMI: 28kg/m^2. Body surface area:    BSA: 2.23m^2.  Blood pressure: 153/71.  Patient status:  Outpatient.  Location:  Endoscopy.   ------------------------------------------------------------  ------------------------------------------------------------ Left ventricle:  The cavity size was normal. There was mild concentric hypertrophy. Systolic function was normal. The estimated ejection fraction was in the range of 60% to 65%. Wall motion was normal; there were no regional wall motion abnormalities.  ------------------------------------------------------------ Aortic  valve:   Bicuspid; severely calcified leaflets. Doppler:   There was moderate stenosis.   AVA 1.0 cm^2 (planimetry).  Trivial regurgitation.    Mean gradient: 73mm Hg (S). Peak gradient: 87mm Hg (S).  ------------------------------------------------------------ Aorta:  Normal caliber aorta with minimal plaque.  ------------------------------------------------------------ Mitral valve:   Mildly calcified leaflets .  Doppler: There was no evidence for stenosis.    Moderate regurgitation.  ------------------------------------------------------------ Left atrium:  The atrium was mildly dilated.  No evidence of thrombus in the atrial cavity or appendage.  ------------------------------------------------------------ Atrial septum:  No defect or patent foramen ovale was identified.  Echo contrast study showed no right-to-left atrial level shunt, at baseline or with provocation.  ------------------------------------------------------------ Pulmonary veins:  No pulmonary vein systolic flow reversal.   ------------------------------------------------------------ Right ventricle:  The cavity size was normal. Systolic function was normal.  ------------------------------------------------------------ Pulmonic valve:    Structurally normal valve.   Cusp separation was normal.  ------------------------------------------------------------ Tricuspid valve:   Doppler:   Trivial regurgitation.  ------------------------------------------------------------ Right atrium:  There is a Chiari network present. The atrium was normal in size.  No evidence of thrombus in the atrial cavity or appendage.  ------------------------------------------------------------ Pericardium:  There was no pericardial effusion.   ------------------------------------------------------------ Post procedure conclusions Ascending Aorta:  - Normal caliber aorta with minimal  plaque.  ------------------------------------------------------------  Doppler measurements   Normal Aortic valve Peak vel, S   348 cm/s ------ Mean vel, S   239 cm/s ------ VTI, S        104 cm   ------ Mean           26 mm   ------ gradient, S       Hg Peak           48 mm   ------ gradient, S       Hg   ------------------------------------------------------------ Prepared and Electronically Authenticated by  Loralie Champagne 2015-01-27T16:00:49.273      Transthoracic Echocardiography  Patient:    Travis Armstrong, Travis Armstrong MR #:       [id number] Study Date: 04/12/2014 Gender:     M Age:        29 Height:     180.3cm Weight:     91.6kg BSA:        2.23m^2 Pt. Status: Room:       [facility]    SONOGRAPHER  Delman Kitten, RCS  ADMITTING  Aundra Dubin, [place]  ATTENDING    Aundra Dubin, [place]  Halina Andreas, [place]  REFERRING    Aundra Dubin, [place]  PERFORMING   [facility], Inpatient cc:  ------------------------------------------------------------ LV EF: 60% -   65%  ------------------------------------------------------------ Indications:      Aortic stenosis 424.1. Limited to reassess aortic valve post cath.  ------------------------------------------------------------ History:   PMH:   Chest pain.  Murmur.  Coronary artery disease.  Risk factors:  Former tobacco use. Dyslipidemia.   ------------------------------------------------------------ Study Conclusions  - Left ventricle: The cavity size was normal. Wall thickness   was increased in a pattern of moderate LVH. Systolic   function was normal. The estimated ejection fraction was   in the range of 60% to 65%. Wall motion was normal; there   were no regional wall motion abnormalities. - Aortic valve: There was moderate to severe stenosis. Valve   area: 0.67cm^2(VTI). Valve area: 0.71cm^2 (Vmax). - Mitral valve: Mild to moderate regurgitation. Transthoracic echocardiography.  M-mode, limited 2D, limited spectral Doppler, and color  Doppler.  Height:  Height: 180.3cm. Height: 71in.  Weight:  Weight: 91.6kg. Weight: 201.5lb.  Body mass index:  BMI: 28.2kg/m^2.  Body surface area:    BSA: 2.46m^2.  Blood pressure:     141/66.  Patient status:  Inpatient.  Location:  Bedside.  ------------------------------------------------------------  ------------------------------------------------------------ Left ventricle:  The cavity size was normal. Wall thickness was increased in a pattern of moderate LVH. Systolic function was normal. The estimated ejection fraction was in the range of 60% to 65%. Wall motion was normal; there were no regional wall motion abnormalities.  ------------------------------------------------------------ Aortic valve:   Moderately thickened, moderately calcified leaflets.  Doppler:   There was moderate to severe stenosis.    No significant regurgitation.    VTI ratio of LVOT to aortic valve: 0.21. Valve area: 0.67cm^2(VTI). Indexed valve area: 0.31cm^2/m^2 (VTI). Peak velocity ratio of LVOT to aortic valve: 0.23. Valve area: 0.71cm^2 (Vmax). Indexed valve area: 0.33cm^2/m^2 (Vmax).    Mean gradient: 66mm Hg (S). Peak gradient: 53mm Hg (S).  ------------------------------------------------------------ Aorta:  Aortic root: The aortic root was normal in size. Ascending aorta: The ascending aorta was normal in size.  ------------------------------------------------------------ Mitral valve:   The valve appears to be grossly normal. Doppler:   Mild to moderate regurgitation.  ------------------------------------------------------------ Left atrium:  The atrium was normal in size.  ------------------------------------------------------------ Pulmonic valve:    The valve appears to be grossly normal.  Doppler:   No significant regurgitation.  ------------------------------------------------------------  2D measurements        Normal  Doppler measurements   Norma Left ventricle                                         l LVID ED,   42.2 mm     43-52   LVOT chord,                         Peak vel, 85. cm/s     ----- PLAX                           S           7 LVID ES,   24.5 mm     23-38   VTI, S    23. cm       -----  chord,                                     2 PLAX                           Aortic valve FS, chord,   42 %      >29     Peak vel, 377 cm/s     ----- PLAX                           S LVPW, ED   15.4 mm     ------  Mean vel, 279 cm/s     ----- IVS/LVPW   1.02        <1.3    S ratio, ED                      VTI, S    109 cm       ----- Ventricular septum             Mean       35 mm Hg    ----- IVS, ED    15.7 mm     ------  gradient, LVOT                           S Diam, S      20 mm     ------  Peak       57 mm Hg    ----- Area       3.14 cm^2   ------  gradient, Aorta                          S Root diam,   35 mm     ------  VTI ratio 0.2          ----- ED                             LVOT/AV     1 Left atrium                    Area, VTI 0.6 cm^2     ----- AP dim       34 mm     ------              7 AP dim     1.57 cm/m^2 <2.2    Area      0.3 cm^2/m^2 ----- index                          index       1                                (VTI)                                Peak vel  0.2          -----  ratio,      3                                LVOT/AV                                Area,     0.7 cm^2     -----                                Vmax        1                                Area      0.3 cm^2/m^2 -----                                index       3                                (Vmax)   ------------------------------------------------------------ Prepared and Electronically Authenticated by  Mertie Moores 2015-04-24T11:06:45.417          Cardiac Catheterization Procedure Note  Name: Travis Armstrong MRN: [id number] DOB: [birth year]  Procedure:  Selective Coronary Angiography  Indication: Angina in patient with  known CAD.                                     Procedural Details: The right wrist was prepped, draped, and anesthetized with 1% lidocaine. Using the modified Seldinger technique, a 5 French sheath was introduced into the right radial artery. 3 mg of verapamil was administered through the sheath, weight-based unfractionated heparin was administered intravenously. Standard Judkins catheters were used for selective coronary angiography. Catheter exchanges were performed over an exchange length guidewire. There were no immediate procedural complications. The patient will get FFR of LAD lesion with Dr. Angelena Form.  Procedural Findings: Hemodynamics: AO 117/56  Coronary angiography: Coronary dominance: right  Left mainstem: 30% mid LM stenosis.   Left anterior descending (LAD): Long stented segment of the proximal to mid LAD.  There was approximately 70% in-stent restenosis in the mid LAD at D2.  The D2 was a small to moderate vessel with 90% ostial stenosis.    Left circumflex (LCx): Large ramus with luminal irregularities.  Small AV LCx with luminal irregularities.    Right coronary artery (RCA): Luminal irregularities.    Left ventriculography: Not done, AS with recent TEE and TTE.    Final Conclusions:  Patient has known at least moderate AS and has had recurrent LAD disease.  Patient has borderline mid LAD in-stent restenosis today with 90% stenosis in the ostial D2 (small to moderate vessel).  Symptoms could potentially come from D2.  Will plan FFR of LAD now.  If stenosis looks significant, would consider LIMA-LAD with AVR given recurrent LAD in-stent restenosis and at least moderate AS.  If LAD stenosis does not look significant, angina may be coming from D2.  In that situation, would add Imdur 30 mg daily to medical regimen.  Either way,  will get limited echo in recovery to reassess aortic valve gradient.   Larey Dresser 04/12/2014, 8:26 AM   FFR 0.74 so significant LAD lesion.   Moderate-severe AS on echo.  Will refer to CVTS for evaluation for LIMA-AVR.  Will not use Imdur.   Larey Dresser 04/12/2014 5:29 PM   MRA CHEST WITH OR WITHOUT CONTRAST   GE 1.5 T magnet with dedicated cardiac coil.  Limited FIESTA sequences done to assess aortic valve.  20 mL Multihance contrast was given followed by MR angiography to assess the thoracic aorta.   Contrast: 20 mL Multihance.   Comparison: None.   Findings: The aortic valve was bicuspid with evidence for aortic stenosis.  Gradient was measured on recent echo.  The thoracic aorta was normal in caliber.  The aortic arch vessels originated normally.  The pulmonary artery was normal in caliber.  The pulmonary veins drained normally to the left atrium.   Measurements:   Aortic root 3.5 cm   Ascending aorta 3.3 cm   Aortic arch 2.6 cm   Proximal descending thoracic aorta 2.5 cm   IMPRESSION: 1. Bicuspid aortic valve with aortic stenosis.   2.  Normal caliber thoracic aorta.   Original Report Authenticated [id number]    Impression:  Patient has bicuspid aortic valve with moderate to severe aortic stenosis and severe single-vessel coronary artery disease with progressive symptoms of exertional angina, CCS functional class III.  He does not have symptoms of congestive heart failure.  Left ventricular function remains preserved. The patient is otherwise fairly healthy and appears to be reasonably good candidate for surgery. I personally reviewed the patient's recent echocardiograms and diagnostic cardiac catheterization. I agree that he would best be treated with coronary artery bypass grafting and aortic valve replacement.   Plan:  The patient and his wife were counseled at length regarding treatment options regarding management of both coronary artery disease and aortic stenosis. Rationale for surgical intervention was discussed at length and compared with continued medical therapy with or without repeat PCI  and stenting of the left anterior descending coronary artery. The patient was counseled at length regarding surgical alternatives with respect to aortic valve replacement including continued medical therapy versus proceeding with conventional surgical aortic valve replacement using either a mechanical prosthesis or a bioprosthetic tissue valve. Other alternatives including transcatheter aortic valve replacement were discussed. Discussion was held comparing the relative risks of mechanical valve replacement with need for lifelong anticoagulation versus use of a bioprosthetic tissue valve and the associated potential for late structural valve deterioration in failure. This discussion was placed in the context of the patient's particular circumstances, and as a result the patient specifically requests that their valve be replaced using a bioprosthetic tissue valve . The patient understands and accepts all potential associated risks of surgery including but not limited to risk of death, stroke, myocardial infarction, congestive heart failure, respiratory failure, renal failure, pneumonia, bleeding requiring blood transfusion and or reexploration, arrhythmia, heart block or bradycardia requiring permanent pacemaker, aortic dissection or other major vascular complication, pleural effusions or other delayed complications related to continued congestive heart failure, and other late complications related to valve replacement including structural valve deterioration and failure, thrombosis, endocarditis, paravalvular leak or late recurrence of symptomatic ischemic heart disease. All of their questions been addressed. We plan to proceed with surgery on Friday, 05/03/2014.    Valentina Gu. Roxy Manns, MD  04/29/2014  3:21 PM

## 2014-04-29 NOTE — Progress Notes (Signed)
[facility]       ,[place] [address]             [phone]     CARDIOTHORACIC SURGERY OFFICE NOTE  Referring Provider is Larey Dresser, MD PCP is Woody Seller, MD   HPI:  Patient returns for followup of severe symptomatic aortic stenosis and coronary artery disease. He was originally seen in consultation on 04/15/2014. Since then he has remained clinically stable and he returns to the office today with plans to proceed with elective aortic valve replacement and coronary artery bypass grafting later this week.  He reports no new problems or complaints of the past 2 weeks.   Current Outpatient Prescriptions  Medication Sig Dispense Refill  . fluticasone (FLONASE) 50 MCG/ACT nasal spray Place 1 spray into both nostrils daily.       . metoprolol tartrate (LOPRESSOR) 25 MG tablet Take 12.5 mg by mouth 2 (two) times daily.       Marland Kitchen aspirin 81 MG tablet Take 81 mg by mouth daily.       Marland Kitchen atorvastatin (LIPITOR) 80 MG tablet Take 40 mg by mouth daily.      Marland Kitchen CALCIUM PO Take 500 mg by mouth daily.      . clopidogrel (PLAVIX) 75 MG tablet Take 75 mg by mouth daily with breakfast.      . lisinopril (PRINIVIL,ZESTRIL) 20 MG tablet Take 20 mg by mouth daily.      Marland Kitchen loratadine (CLARITIN) 10 MG tablet Take 10 mg by mouth daily.       . magnesium gluconate (MAGONATE) 500 MG tablet Take 500 mg by mouth daily.       . nitroGLYCERIN (NITROSTAT) 0.4 MG SL tablet Place 1 tablet (0.4 mg total) under the tongue every 5 (five) minutes as needed for chest pain.  25 tablet  3  . Omega-3 Fatty Acids (FISH OIL PO) Take 1 capsule by mouth daily.        No current facility-administered medications for this visit.      Physical Exam:   BP 157/67  Pulse 50  Resp 20  Ht 5\' 11"  (1.803 m)  Wt 203 lb (92.08 kg)  BMI 28.33 kg/m2  SpO2 97%  General:  Well-appearing  Chest:   Clear  CV:   Regular rate and rhythm with systolic murmur  Incisions:  n/a  Abdomen:  Soft and  nontender  Extremities:  Warm and well-perfused  Diagnostic Tests:  n/a   Impression:  Patient has bicuspid aortic valve with moderate to severe aortic stenosis and severe single-vessel coronary artery disease with progressive symptoms of exertional angina, CCS functional class III. He does not have symptoms of congestive heart failure. Left ventricular function remains preserved. The patient is otherwise fairly healthy and appears to be reasonably good candidate for surgery. I personally reviewed the patient's recent echocardiograms and diagnostic cardiac catheterization. I agree that he would best be treated with coronary artery bypass grafting and aortic valve replacement.    Plan:  The patient and his wife were counseled at length regarding treatment options regarding management of both coronary artery disease and aortic stenosis. Rationale for surgical intervention was discussed at length and compared with continued medical therapy with or without repeat PCI and stenting of the left anterior descending coronary artery.  The patient was counseled at length regarding surgical alternatives with respect to aortic valve replacement including continued medical therapy versus proceeding with conventional surgical aortic valve  replacement using either a mechanical prosthesis or a bioprosthetic tissue valve.  Other alternatives including transcatheter aortic valve replacement were discussed.  Discussion was held comparing the relative risks of mechanical valve replacement with need for lifelong anticoagulation versus use of a bioprosthetic tissue valve and the associated potential for late structural valve deterioration in failure.  This discussion was placed in the context of the patient's particular circumstances, and as a result the patient specifically requests that their valve be replaced using a bioprosthetic tissue valve .  The patient understands and accepts all potential associated risks of  surgery including but not limited to risk of death, stroke, myocardial infarction, congestive heart failure, respiratory failure, renal failure, pneumonia, bleeding requiring blood transfusion and or reexploration, arrhythmia, heart block or bradycardia requiring permanent pacemaker, aortic dissection or other major vascular complication, pleural effusions or other delayed complications related to continued congestive heart failure, and other late complications related to valve replacement including structural valve deterioration and failure, thrombosis, endocarditis, paravalvular leak or late recurrence of symptomatic ischemic heart disease.  All of their questions been addressed. We plan to proceed with surgery on Friday, 05/03/2014.     I spent in excess of 30 minutes during the conduct of this office consultation and >50% of this time involved direct face-to-face encounter with the patient for counseling and/or coordination of their care.   Valentina Gu. Roxy Manns, MD 04/29/2014 3:21 PM

## 2014-04-29 NOTE — Progress Notes (Signed)
VASCULAR LAB PRELIMINARY  PRELIMINARY  PRELIMINARY  PRELIMINARY  Pre-op Cardiac Surgery  Carotid Findings:  Bilateral:  1-39% ICA stenosis.  Vertebral artery flow is antegrade.     Upper Extremity Right Left  Brachial Pressures 129 Triphasic 142 Triphasic  Radial Waveforms Triphasic Triphasic  Ulnar Waveforms Triphasic Triphasic  Palmar Arch (Allen's Test) Normal Normal   Findings:  Doppler waveforms remained normal bilaterally with both radial and ulnar compressions/    Lower  Extremity Right Left  Dorsalis Pedis    Anterior Tibial    Posterior Tibial    Ankle/Brachial Indices      Findings:  Pedal pulses were palpable bilaterally.   [place], [place] 04/29/2014, 12:50 PM

## 2014-04-29 NOTE — Progress Notes (Signed)
Prescription called into [facility] on [address] for mupirocin. Left message on voicemail for patient

## 2014-04-29 NOTE — Progress Notes (Signed)
04/29/14 1254  OBSTRUCTIVE SLEEP APNEA  Have you ever been diagnosed with sleep apnea through a sleep study? No  Do you snore loudly (loud enough to be heard through closed doors)?  1  Do you often feel tired, fatigued, or sleepy during the daytime? 1  Has anyone observed you stop breathing during your sleep? 1  Do you have, or are you being treated for high blood pressure? 1  BMI more than 35 kg/m2? 0  Age over 73 years old? 1  Neck circumference greater than 40 cm/16 inches? 0  Gender: 1  Obstructive Sleep Apnea Score 6  Score 4 or greater  Results sent to PCP

## 2014-05-02 ENCOUNTER — Ambulatory Visit: Payer: Medicare Other | Admitting: Cardiology

## 2014-05-02 LAB — PULMONARY FUNCTION TEST
DL/VA % pred: 73 %
DL/VA: 3.42 ml/min/mmHg/L
DLCO UNC % PRED: 73 %
DLCO UNC: 24.65 ml/min/mmHg
DLCO cor % pred: 73 %
DLCO cor: 24.65 ml/min/mmHg
FEF 25-75 Pre: 2.35 L/sec
FEF2575-%Pred-Pre: 97 %
FEV1-%Pred-Pre: 99 %
FEV1-PRE: 3.26 L
FEV1FVC-%Pred-Pre: 100 %
FEV6-%PRED-PRE: 105 %
FEV6-PRE: 4.44 L
FEV6FVC-%Pred-Pre: 105 %
FVC-%Pred-Pre: 99 %
FVC-Pre: 4.46 L
PRE FEV6/FVC RATIO: 100 %
Pre FEV1/FVC ratio: 73 %
RV % PRED: 92 %
RV: 2.39 L
TLC % PRED: 100 %
TLC: 7.29 L

## 2014-05-02 MED ORDER — VANCOMYCIN HCL 10 G IV SOLR
1500.0000 mg | INTRAVENOUS | Status: AC
  Administered 2014-05-03: 1500 mg via INTRAVENOUS
  Filled 2014-05-02: qty 1500

## 2014-05-02 MED ORDER — PAPAVERINE HCL 30 MG/ML IJ SOLN
INTRAMUSCULAR | Status: AC
  Administered 2014-05-03: 10:00:00
  Filled 2014-05-02 (×2): qty 2.5

## 2014-05-02 MED ORDER — POTASSIUM CHLORIDE 2 MEQ/ML IV SOLN
80.0000 meq | INTRAVENOUS | Status: DC
Start: 2014-05-03 — End: 2014-05-03
  Filled 2014-05-02: qty 40

## 2014-05-02 MED ORDER — PHENYLEPHRINE HCL 10 MG/ML IJ SOLN
30.0000 ug/min | INTRAMUSCULAR | Status: AC
  Administered 2014-05-03: 20 ug/min via INTRAVENOUS
  Filled 2014-05-02: qty 2

## 2014-05-02 MED ORDER — DEXTROSE 5 % IV SOLN
0.5000 ug/min | INTRAVENOUS | Status: DC
  Filled 2014-05-02: qty 4

## 2014-05-02 MED ORDER — DEXMEDETOMIDINE HCL IN NACL 400 MCG/100ML IV SOLN
0.1000 ug/kg/h | INTRAVENOUS | Status: AC
  Administered 2014-05-03: 0.3 ug/kg/h via INTRAVENOUS
  Filled 2014-05-02: qty 100

## 2014-05-02 MED ORDER — DEXTROSE 5 % IV SOLN
750.0000 mg | INTRAVENOUS | Status: DC
  Filled 2014-05-02: qty 750

## 2014-05-02 MED ORDER — NITROGLYCERIN IN D5W 200-5 MCG/ML-% IV SOLN
2.0000 ug/min | INTRAVENOUS | Status: AC
  Administered 2014-05-03: 5 ug/min via INTRAVENOUS
  Filled 2014-05-02: qty 250

## 2014-05-02 MED ORDER — METOPROLOL TARTRATE 12.5 MG HALF TABLET
12.5000 mg | ORAL_TABLET | Freq: Once | ORAL | Status: AC
  Administered 2014-05-03: 12.5 mg via ORAL
  Filled 2014-05-02: qty 1

## 2014-05-02 MED ORDER — MAGNESIUM SULFATE 50 % IJ SOLN
40.0000 meq | INTRAMUSCULAR | Status: DC
Start: 2014-05-03 — End: 2014-05-03
  Filled 2014-05-02: qty 10

## 2014-05-02 MED ORDER — SODIUM CHLORIDE 0.9 % IV SOLN
INTRAVENOUS | Status: AC
  Administered 2014-05-03: 69.8 mL/h via INTRAVENOUS
  Filled 2014-05-02: qty 40

## 2014-05-02 MED ORDER — CHLORHEXIDINE GLUCONATE 4 % EX LIQD
30.0000 mL | CUTANEOUS | Status: DC
  Filled 2014-05-02: qty 30

## 2014-05-02 MED ORDER — VANCOMYCIN HCL 1000 MG IV SOLR
INTRAVENOUS | Status: AC
  Administered 2014-05-03: 10:00:00
  Filled 2014-05-02 (×2): qty 1000

## 2014-05-02 MED ORDER — CEFUROXIME SODIUM 1.5 G IJ SOLR
1.5000 g | INTRAMUSCULAR | Status: AC
  Administered 2014-05-03: 750 mg via INTRAVENOUS
  Administered 2014-05-03: 1500 mg via INTRAVENOUS
  Filled 2014-05-02: qty 1.5

## 2014-05-02 MED ORDER — SODIUM CHLORIDE 0.9 % IV SOLN
INTRAVENOUS | Status: AC
  Administered 2014-05-03: 1 [IU]/h via INTRAVENOUS
  Filled 2014-05-02: qty 1

## 2014-05-02 MED ORDER — SODIUM CHLORIDE 0.9 % IV SOLN
INTRAVENOUS | Status: DC
  Filled 2014-05-02: qty 30

## 2014-05-02 MED ORDER — DOPAMINE-DEXTROSE 3.2-5 MG/ML-% IV SOLN
2.0000 ug/kg/min | INTRAVENOUS | Status: DC
  Filled 2014-05-02: qty 250

## 2014-05-03 ENCOUNTER — Inpatient Hospital Stay (HOSPITAL_COMMUNITY): Payer: Medicare Other | Admitting: Certified Registered Nurse Anesthetist

## 2014-05-03 ENCOUNTER — Inpatient Hospital Stay (HOSPITAL_COMMUNITY)
Admission: RE | Admit: 2014-05-03 | Discharge: 2014-05-08 | DRG: 220 | Disposition: A | Discharge status: Home / Self Care | Payer: Medicare Other | Source: Ambulatory Visit | Attending: Thoracic Surgery (Cardiothoracic Vascular Surgery) | Admitting: Thoracic Surgery (Cardiothoracic Vascular Surgery)

## 2014-05-03 ENCOUNTER — Encounter (HOSPITAL_COMMUNITY)
Admission: RE | Disposition: A | Discharge status: Home / Self Care | Payer: Medicare Other | Source: Ambulatory Visit | Attending: Thoracic Surgery (Cardiothoracic Vascular Surgery)

## 2014-05-03 ENCOUNTER — Inpatient Hospital Stay (HOSPITAL_COMMUNITY): Payer: Medicare Other

## 2014-05-03 ENCOUNTER — Encounter (HOSPITAL_COMMUNITY): Payer: Medicare Other | Admitting: Certified Registered Nurse Anesthetist

## 2014-05-03 ENCOUNTER — Encounter (HOSPITAL_COMMUNITY): Payer: Self-pay | Admitting: Certified Registered Nurse Anesthetist

## 2014-05-03 DIAGNOSIS — I209 Angina pectoris, unspecified: Secondary | ICD-10-CM | POA: Diagnosis present

## 2014-05-03 DIAGNOSIS — D62 Acute posthemorrhagic anemia: Secondary | ICD-10-CM | POA: Diagnosis not present

## 2014-05-03 DIAGNOSIS — I350 Nonrheumatic aortic (valve) stenosis: Secondary | ICD-10-CM | POA: Diagnosis present

## 2014-05-03 DIAGNOSIS — Z8249 Family history of ischemic heart disease and other diseases of the circulatory system: Secondary | ICD-10-CM

## 2014-05-03 DIAGNOSIS — I059 Rheumatic mitral valve disease, unspecified: Secondary | ICD-10-CM | POA: Diagnosis present

## 2014-05-03 DIAGNOSIS — Q231 Congenital insufficiency of aortic valve: Secondary | ICD-10-CM

## 2014-05-03 DIAGNOSIS — Q248 Other specified congenital malformations of heart: Secondary | ICD-10-CM

## 2014-05-03 DIAGNOSIS — E872 Acidosis, unspecified: Secondary | ICD-10-CM | POA: Diagnosis not present

## 2014-05-03 DIAGNOSIS — I2510 Atherosclerotic heart disease of native coronary artery without angina pectoris: Secondary | ICD-10-CM

## 2014-05-03 DIAGNOSIS — T82897A Other specified complication of cardiac prosthetic devices, implants and grafts, initial encounter: Secondary | ICD-10-CM | POA: Diagnosis present

## 2014-05-03 DIAGNOSIS — Y831 Surgical operation with implant of artificial internal device as the cause of abnormal reaction of the patient, or of later complication, without mention of misadventure at the time of the procedure: Secondary | ICD-10-CM | POA: Diagnosis present

## 2014-05-03 DIAGNOSIS — M129 Arthropathy, unspecified: Secondary | ICD-10-CM | POA: Diagnosis present

## 2014-05-03 DIAGNOSIS — Z87891 Personal history of nicotine dependence: Secondary | ICD-10-CM

## 2014-05-03 DIAGNOSIS — J9819 Other pulmonary collapse: Secondary | ICD-10-CM | POA: Diagnosis not present

## 2014-05-03 DIAGNOSIS — Z951 Presence of aortocoronary bypass graft: Secondary | ICD-10-CM

## 2014-05-03 DIAGNOSIS — I359 Nonrheumatic aortic valve disorder, unspecified: Secondary | ICD-10-CM

## 2014-05-03 DIAGNOSIS — Z953 Presence of xenogenic heart valve: Secondary | ICD-10-CM

## 2014-05-03 DIAGNOSIS — D696 Thrombocytopenia, unspecified: Secondary | ICD-10-CM | POA: Diagnosis not present

## 2014-05-03 DIAGNOSIS — Z7982 Long term (current) use of aspirin: Secondary | ICD-10-CM

## 2014-05-03 DIAGNOSIS — D6959 Other secondary thrombocytopenia: Secondary | ICD-10-CM | POA: Diagnosis not present

## 2014-05-03 DIAGNOSIS — Z6828 Body mass index (BMI) 28.0-28.9, adult: Secondary | ICD-10-CM

## 2014-05-03 DIAGNOSIS — Z8774 Personal history of (corrected) congenital malformations of heart and circulatory system: Secondary | ICD-10-CM

## 2014-05-03 DIAGNOSIS — J988 Other specified respiratory disorders: Secondary | ICD-10-CM | POA: Diagnosis not present

## 2014-05-03 DIAGNOSIS — I10 Essential (primary) hypertension: Secondary | ICD-10-CM | POA: Diagnosis present

## 2014-05-03 DIAGNOSIS — E785 Hyperlipidemia, unspecified: Secondary | ICD-10-CM | POA: Diagnosis present

## 2014-05-03 DIAGNOSIS — Z9861 Coronary angioplasty status: Secondary | ICD-10-CM

## 2014-05-03 HISTORY — PX: INTRAOPERATIVE TRANSESOPHAGEAL ECHOCARDIOGRAM: SHX5062

## 2014-05-03 HISTORY — PX: CORONARY ARTERY BYPASS GRAFT: SHX141

## 2014-05-03 HISTORY — PX: AORTIC VALVE REPLACEMENT: SHX41

## 2014-05-03 LAB — POCT I-STAT 4, (NA,K, GLUC, HGB,HCT)
GLUCOSE: 111 mg/dL — AB (ref 70–99)
GLUCOSE: 129 mg/dL — AB (ref 70–99)
Glucose, Bld: 123 mg/dL — ABNORMAL HIGH (ref 70–99)
Glucose, Bld: 99 mg/dL (ref 70–99)
Glucose, Bld: 116 mg/dL — ABNORMAL HIGH (ref 70–99)
Glucose, Bld: 110 mg/dL — ABNORMAL HIGH (ref 70–99)
HCT: 31 % — ABNORMAL LOW (ref 39.0–52.0)
HCT: 25 % — ABNORMAL LOW (ref 39.0–52.0)
HCT: 28 % — ABNORMAL LOW (ref 39.0–52.0)
HCT: 26 % — ABNORMAL LOW (ref 39.0–52.0)
HCT: 29 % — ABNORMAL LOW (ref 39.0–52.0)
HEMATOCRIT: 31 % — AB (ref 39.0–52.0)
HEMOGLOBIN: 10.5 g/dL — AB (ref 13.0–17.0)
HEMOGLOBIN: 9.9 g/dL — AB (ref 13.0–17.0)
Hemoglobin: 10.5 g/dL — ABNORMAL LOW (ref 13.0–17.0)
Hemoglobin: 8.5 g/dL — ABNORMAL LOW (ref 13.0–17.0)
Hemoglobin: 9.5 g/dL — ABNORMAL LOW (ref 13.0–17.0)
Hemoglobin: 8.8 g/dL — ABNORMAL LOW (ref 13.0–17.0)
POTASSIUM: 4.1 meq/L (ref 3.7–5.3)
POTASSIUM: 5.2 meq/L (ref 3.7–5.3)
POTASSIUM: 4.1 meq/L (ref 3.7–5.3)
Potassium: 4.3 mEq/L (ref 3.7–5.3)
Potassium: 4.2 mEq/L (ref 3.7–5.3)
Potassium: 5.8 mEq/L — ABNORMAL HIGH (ref 3.7–5.3)
Sodium: 139 mEq/L (ref 137–147)
Sodium: 137 mEq/L (ref 137–147)
Sodium: 140 mEq/L (ref 137–147)
Sodium: 137 mEq/L (ref 137–147)
Sodium: 134 mEq/L — ABNORMAL LOW (ref 137–147)
Sodium: 141 mEq/L (ref 137–147)

## 2014-05-03 LAB — POCT I-STAT, CHEM 8
BUN: 14 mg/dL (ref 6–23)
Calcium, Ion: 1.24 mmol/L (ref 1.13–1.30)
Chloride: 107 mEq/L (ref 96–112)
Creatinine, Ser: 1 mg/dL (ref 0.50–1.35)
Glucose, Bld: 118 mg/dL — ABNORMAL HIGH (ref 70–99)
HEMATOCRIT: 25 % — AB (ref 39.0–52.0)
HEMOGLOBIN: 8.5 g/dL — AB (ref 13.0–17.0)
Potassium: 3.9 mEq/L (ref 3.7–5.3)
SODIUM: 143 meq/L (ref 137–147)
TCO2: 24 mmol/L (ref 0–100)

## 2014-05-03 LAB — CBC
HCT: 27.5 % — ABNORMAL LOW (ref 39.0–52.0)
HEMATOCRIT: 30.3 % — AB (ref 39.0–52.0)
Hemoglobin: 10.8 g/dL — ABNORMAL LOW (ref 13.0–17.0)
Hemoglobin: 9.8 g/dL — ABNORMAL LOW (ref 13.0–17.0)
MCH: 32.5 pg (ref 26.0–34.0)
MCH: 32.6 pg (ref 26.0–34.0)
MCHC: 35.6 g/dL (ref 30.0–36.0)
MCHC: 35.6 g/dL (ref 30.0–36.0)
MCV: 91.3 fL (ref 78.0–100.0)
MCV: 91.4 fL (ref 78.0–100.0)
PLATELETS: 76 10*3/uL — AB (ref 150–400)
Platelets: 77 10*3/uL — ABNORMAL LOW (ref 150–400)
RBC: 3.32 MIL/uL — ABNORMAL LOW (ref 4.22–5.81)
RBC: 3.01 MIL/uL — ABNORMAL LOW (ref 4.22–5.81)
RDW: 13.2 % (ref 11.5–15.5)
RDW: 13.3 % (ref 11.5–15.5)
WBC: 9.3 10*3/uL (ref 4.0–10.5)
WBC: 8.7 10*3/uL (ref 4.0–10.5)

## 2014-05-03 LAB — POCT I-STAT 3, ART BLOOD GAS (G3+)
Acid-Base Excess: 2 mmol/L (ref 0.0–2.0)
Acid-base deficit: 3 mmol/L — ABNORMAL HIGH (ref 0.0–2.0)
Bicarbonate: 26.8 mEq/L — ABNORMAL HIGH (ref 20.0–24.0)
Bicarbonate: 24.3 mEq/L — ABNORMAL HIGH (ref 20.0–24.0)
Bicarbonate: 23 mEq/L (ref 20.0–24.0)
O2 SAT: 100 %
O2 SAT: 100 %
O2 Saturation: 97 %
PCO2 ART: 34.9 mmHg — AB (ref 35.0–45.0)
PCO2 ART: 49.7 mmHg — AB (ref 35.0–45.0)
PH ART: 7.437 (ref 7.350–7.450)
PH ART: 7.277 — AB (ref 7.350–7.450)
PO2 ART: 248 mmHg — AB (ref 80.0–100.0)
Patient temperature: 37.5
TCO2: 28 mmol/L (ref 0–100)
TCO2: 25 mmol/L (ref 0–100)
TCO2: 25 mmol/L (ref 0–100)
pCO2 arterial: 39.8 mmHg (ref 35.0–45.0)
pH, Arterial: 7.449 (ref 7.350–7.450)
pO2, Arterial: 335 mmHg — ABNORMAL HIGH (ref 80.0–100.0)
pO2, Arterial: 108 mmHg — ABNORMAL HIGH (ref 80.0–100.0)

## 2014-05-03 LAB — CREATININE, SERUM
Creatinine, Ser: 1.09 mg/dL (ref 0.50–1.35)
GFR calc Af Amer: 76 mL/min — ABNORMAL LOW (ref >90)
GFR calc non Af Amer: 65 mL/min — ABNORMAL LOW (ref >90)

## 2014-05-03 LAB — HEMOGLOBIN AND HEMATOCRIT, BLOOD
HEMATOCRIT: 27.2 % — AB (ref 39.0–52.0)
Hemoglobin: 9.8 g/dL — ABNORMAL LOW (ref 13.0–17.0)

## 2014-05-03 LAB — MAGNESIUM: Magnesium: 2.9 mg/dL — ABNORMAL HIGH (ref 1.5–2.5)

## 2014-05-03 LAB — APTT: aPTT: 46 seconds — ABNORMAL HIGH (ref 24–37)

## 2014-05-03 LAB — PROTIME-INR
INR: 1.58 — ABNORMAL HIGH (ref 0.00–1.49)
Prothrombin Time: 18.4 seconds — ABNORMAL HIGH (ref 11.6–15.2)

## 2014-05-03 LAB — PLATELET COUNT: Platelets: 103 10*3/uL — ABNORMAL LOW (ref 150–400)

## 2014-05-03 SURGERY — REPLACEMENT, AORTIC VALVE, OPEN
Anesthesia: General | Site: Chest

## 2014-05-03 MED ORDER — ACETAMINOPHEN 160 MG/5ML PO SOLN: 650.0000 mg | Freq: Once | ORAL | Status: AC

## 2014-05-03 MED ORDER — LIDOCAINE HCL (CARDIAC) 20 MG/ML IV SOLN
INTRAVENOUS | Status: AC
  Filled 2014-05-03: qty 5

## 2014-05-03 MED ORDER — INSULIN REGULAR BOLUS VIA INFUSION
0.0000 [IU] | Freq: Three times a day (TID) | INTRAVENOUS | Status: DC
  Filled 2014-05-03: qty 10

## 2014-05-03 MED ORDER — SODIUM CHLORIDE 0.9 % IJ SOLN
OROMUCOSAL | Status: DC | PRN
  Administered 2014-05-03: 10:00:00 via TOPICAL

## 2014-05-03 MED ORDER — NITROGLYCERIN IN D5W 200-5 MCG/ML-% IV SOLN
0.0000 ug/min | INTRAVENOUS | Status: DC
  Administered 2014-05-03: 10 ug/min via INTRAVENOUS

## 2014-05-03 MED ORDER — SODIUM CHLORIDE 0.45 % IV SOLN
INTRAVENOUS | Status: DC
  Administered 2014-05-03: 13:00:00 via INTRAVENOUS

## 2014-05-03 MED ORDER — SODIUM CHLORIDE 0.9 % IJ SOLN: 3.0000 mL | INTRAMUSCULAR | Status: DC | PRN

## 2014-05-03 MED ORDER — MORPHINE SULFATE 2 MG/ML IJ SOLN: 2.0000 mg | INTRAMUSCULAR | Status: DC | PRN

## 2014-05-03 MED ORDER — ASPIRIN 81 MG PO CHEW: 324.0000 mg | CHEWABLE_TABLET | Freq: Every day | ORAL | Status: DC

## 2014-05-03 MED ORDER — MIDAZOLAM HCL 5 MG/5ML IJ SOLN
INTRAMUSCULAR | Status: DC | PRN
  Administered 2014-05-03: 3 mg via INTRAVENOUS
  Administered 2014-05-03 (×3): 5 mg via INTRAVENOUS
  Administered 2014-05-03: 2 mg via INTRAVENOUS

## 2014-05-03 MED ORDER — ONDANSETRON HCL 4 MG/2ML IJ SOLN: 4.0000 mg | Freq: Four times a day (QID) | INTRAMUSCULAR | Status: DC | PRN

## 2014-05-03 MED ORDER — HEPARIN SODIUM (PORCINE) 1000 UNIT/ML IJ SOLN
INTRAMUSCULAR | Status: DC | PRN
  Administered 2014-05-03: 18000 [IU] via INTRAVENOUS
  Administered 2014-05-03: 2000 [IU] via INTRAVENOUS

## 2014-05-03 MED ORDER — LACTATED RINGERS IV SOLN
INTRAVENOUS | Status: DC | PRN
  Administered 2014-05-03 (×2): via INTRAVENOUS

## 2014-05-03 MED ORDER — LACTATED RINGERS IV SOLN
INTRAVENOUS | Status: DC
  Administered 2014-05-03: 13:00:00 via INTRAVENOUS

## 2014-05-03 MED ORDER — VANCOMYCIN HCL IN DEXTROSE 1-5 GM/200ML-% IV SOLN
1000.0000 mg | Freq: Once | INTRAVENOUS | Status: AC
  Administered 2014-05-03: 1000 mg via INTRAVENOUS
  Filled 2014-05-03: qty 200

## 2014-05-03 MED ORDER — METOPROLOL TARTRATE 12.5 MG HALF TABLET
12.5000 mg | ORAL_TABLET | Freq: Two times a day (BID) | ORAL | Status: DC
  Administered 2014-05-04 – 2014-05-05 (×3): 12.5 mg via ORAL
  Filled 2014-05-03 (×7): qty 1

## 2014-05-03 MED ORDER — LACTATED RINGERS IV SOLN
INTRAVENOUS | Status: DC | PRN
  Administered 2014-05-03 (×2): via INTRAVENOUS

## 2014-05-03 MED ORDER — MORPHINE SULFATE 2 MG/ML IJ SOLN
1.0000 mg | INTRAMUSCULAR | Status: AC | PRN
  Administered 2014-05-03: 2 mg via INTRAVENOUS
  Filled 2014-05-03: qty 1

## 2014-05-03 MED ORDER — MIDAZOLAM HCL 10 MG/2ML IJ SOLN
INTRAMUSCULAR | Status: AC
  Filled 2014-05-03: qty 2

## 2014-05-03 MED ORDER — SODIUM CHLORIDE 0.9 % IJ SOLN
3.0000 mL | Freq: Two times a day (BID) | INTRAMUSCULAR | Status: DC
  Administered 2014-05-04 – 2014-05-05 (×4): 3 mL via INTRAVENOUS

## 2014-05-03 MED ORDER — ALBUMIN HUMAN 5 % IV SOLN
250.0000 mL | INTRAVENOUS | Status: AC | PRN
  Administered 2014-05-03 (×3): 250 mL via INTRAVENOUS
  Filled 2014-05-03: qty 250

## 2014-05-03 MED ORDER — ALBUMIN HUMAN 5 % IV SOLN
INTRAVENOUS | Status: DC | PRN
  Administered 2014-05-03: 11:00:00 via INTRAVENOUS

## 2014-05-03 MED ORDER — MAGNESIUM SULFATE 4000MG/100ML IJ SOLN
4.0000 g | Freq: Once | INTRAMUSCULAR | Status: AC
  Administered 2014-05-03: 4 g via INTRAVENOUS

## 2014-05-03 MED ORDER — PHENYLEPHRINE HCL 10 MG/ML IJ SOLN
INTRAMUSCULAR | Status: DC | PRN
  Administered 2014-05-03: 40 ug via INTRAVENOUS

## 2014-05-03 MED ORDER — MIDAZOLAM HCL 2 MG/2ML IJ SOLN: 2.0000 mg | INTRAMUSCULAR | Status: DC | PRN

## 2014-05-03 MED ORDER — LACTATED RINGERS IV SOLN: 500.0000 mL | Freq: Once | INTRAVENOUS | Status: AC | PRN

## 2014-05-03 MED ORDER — HEPARIN SODIUM (PORCINE) 1000 UNIT/ML IJ SOLN
INTRAMUSCULAR | Status: AC
  Filled 2014-05-03: qty 1

## 2014-05-03 MED ORDER — BISACODYL 5 MG PO TBEC
10.0000 mg | DELAYED_RELEASE_TABLET | Freq: Every day | ORAL | Status: DC
  Administered 2014-05-04 – 2014-05-06 (×3): 10 mg via ORAL
  Filled 2014-05-03 (×4): qty 2

## 2014-05-03 MED ORDER — SODIUM CHLORIDE 0.9 % IV SOLN
INTRAVENOUS | Status: DC | PRN
  Administered 2014-05-03: 12:00:00 via INTRAVENOUS

## 2014-05-03 MED ORDER — DOCUSATE SODIUM 100 MG PO CAPS
200.0000 mg | ORAL_CAPSULE | Freq: Every day | ORAL | Status: DC
  Administered 2014-05-04 – 2014-05-07 (×4): 200 mg via ORAL
  Filled 2014-05-03 (×5): qty 2

## 2014-05-03 MED ORDER — FENTANYL CITRATE 0.05 MG/ML IJ SOLN
INTRAMUSCULAR | Status: AC
  Filled 2014-05-03: qty 5

## 2014-05-03 MED ORDER — DEXMEDETOMIDINE HCL IN NACL 200 MCG/50ML IV SOLN
0.1000 ug/kg/h | INTRAVENOUS | Status: DC
  Administered 2014-05-03: 0.7 ug/kg/h via INTRAVENOUS
  Filled 2014-05-03: qty 50

## 2014-05-03 MED ORDER — SODIUM CHLORIDE 0.9 % IR SOLN
Status: DC | PRN
  Administered 2014-05-03: 1

## 2014-05-03 MED ORDER — METOPROLOL TARTRATE 25 MG/10 ML ORAL SUSPENSION
12.5000 mg | Freq: Two times a day (BID) | ORAL | Status: DC
  Filled 2014-05-03 (×7): qty 5

## 2014-05-03 MED ORDER — MAGNESIUM SULFATE 40 MG/ML IJ SOLN
INTRAMUSCULAR | Status: AC
  Filled 2014-05-03: qty 100

## 2014-05-03 MED ORDER — ACETAMINOPHEN 650 MG RE SUPP
650.0000 mg | Freq: Once | RECTAL | Status: AC
  Administered 2014-05-03: 650 mg via RECTAL

## 2014-05-03 MED ORDER — PROPOFOL 10 MG/ML IV BOLUS
INTRAVENOUS | Status: DC | PRN
  Administered 2014-05-03 (×2): 50 mg via INTRAVENOUS

## 2014-05-03 MED ORDER — FENTANYL CITRATE 0.05 MG/ML IJ SOLN
INTRAMUSCULAR | Status: DC | PRN
  Administered 2014-05-03: 200 ug via INTRAVENOUS
  Administered 2014-05-03: 250 ug via INTRAVENOUS
  Administered 2014-05-03: 50 ug via INTRAVENOUS
  Administered 2014-05-03: 750 ug via INTRAVENOUS
  Administered 2014-05-03: 100 ug via INTRAVENOUS
  Administered 2014-05-03: 150 ug via INTRAVENOUS

## 2014-05-03 MED ORDER — SODIUM CHLORIDE 0.9 % IV SOLN: 250.0000 mL | INTRAVENOUS | Status: AC

## 2014-05-03 MED ORDER — ARTIFICIAL TEARS OP OINT
TOPICAL_OINTMENT | OPHTHALMIC | Status: AC
  Filled 2014-05-03: qty 3.5

## 2014-05-03 MED ORDER — ROCURONIUM BROMIDE 50 MG/5ML IV SOLN
INTRAVENOUS | Status: AC
  Filled 2014-05-03: qty 1

## 2014-05-03 MED ORDER — PROTAMINE SULFATE 10 MG/ML IV SOLN
INTRAVENOUS | Status: DC | PRN
Start: 2014-05-03 — End: 2014-05-03
  Administered 2014-05-03 (×2): 50 mg via INTRAVENOUS
  Administered 2014-05-03: 20 mg via INTRAVENOUS
  Administered 2014-05-03 (×2): 50 mg via INTRAVENOUS

## 2014-05-03 MED ORDER — SODIUM CHLORIDE 0.9 % IV SOLN
INTRAVENOUS | Status: DC
  Administered 2014-05-03: 13:00:00 via INTRAVENOUS

## 2014-05-03 MED ORDER — EPHEDRINE SULFATE 50 MG/ML IJ SOLN
INTRAMUSCULAR | Status: AC
  Filled 2014-05-03: qty 1

## 2014-05-03 MED ORDER — PROTAMINE SULFATE 10 MG/ML IV SOLN
INTRAVENOUS | Status: AC
  Filled 2014-05-03: qty 25

## 2014-05-03 MED ORDER — PHENYLEPHRINE HCL 10 MG/ML IJ SOLN
0.0000 ug/min | INTRAVENOUS | Status: DC
  Administered 2014-05-03: 0 ug/min via INTRAVENOUS
  Filled 2014-05-03: qty 2

## 2014-05-03 MED ORDER — ARTIFICIAL TEARS OP OINT
TOPICAL_OINTMENT | OPHTHALMIC | Status: DC | PRN
  Administered 2014-05-03: 1 via OPHTHALMIC

## 2014-05-03 MED ORDER — FAMOTIDINE IN NACL 20-0.9 MG/50ML-% IV SOLN
20.0000 mg | Freq: Two times a day (BID) | INTRAVENOUS | Status: DC
  Administered 2014-05-03: 20 mg via INTRAVENOUS
  Filled 2014-05-03: qty 50

## 2014-05-03 MED ORDER — PHENYLEPHRINE 40 MCG/ML (10ML) SYRINGE FOR IV PUSH (FOR BLOOD PRESSURE SUPPORT)
PREFILLED_SYRINGE | INTRAVENOUS | Status: AC
  Filled 2014-05-03: qty 10

## 2014-05-03 MED ORDER — PHENYLEPHRINE HCL 10 MG/ML IJ SOLN
10.0000 mg | INTRAMUSCULAR | Status: DC | PRN
  Administered 2014-05-03: 20 ug/min via INTRAVENOUS

## 2014-05-03 MED ORDER — SUCCINYLCHOLINE CHLORIDE 20 MG/ML IJ SOLN
INTRAMUSCULAR | Status: AC
  Filled 2014-05-03: qty 1

## 2014-05-03 MED ORDER — PANTOPRAZOLE SODIUM 40 MG PO TBEC
40.0000 mg | DELAYED_RELEASE_TABLET | Freq: Every day | ORAL | Status: DC
  Administered 2014-05-05 – 2014-05-07 (×3): 40 mg via ORAL
  Filled 2014-05-03 (×3): qty 1

## 2014-05-03 MED ORDER — ROCURONIUM BROMIDE 50 MG/5ML IV SOLN
INTRAVENOUS | Status: AC
  Filled 2014-05-03: qty 4

## 2014-05-03 MED ORDER — BISACODYL 10 MG RE SUPP: 10.0000 mg | Freq: Every day | RECTAL | Status: DC

## 2014-05-03 MED ORDER — SODIUM CHLORIDE 0.9 % IV SOLN
INTRAVENOUS | Status: DC
  Administered 2014-05-03: 1.1 [IU]/h via INTRAVENOUS
  Administered 2014-05-03: 1.2 [IU]/h via INTRAVENOUS
  Administered 2014-05-03: 1 [IU]/h via INTRAVENOUS
  Administered 2014-05-03: 1.2 [IU]/h via INTRAVENOUS
  Filled 2014-05-03 (×2): qty 1

## 2014-05-03 MED ORDER — ACETAMINOPHEN 500 MG PO TABS
1000.0000 mg | ORAL_TABLET | Freq: Four times a day (QID) | ORAL | Status: DC
  Administered 2014-05-04 – 2014-05-08 (×16): 1000 mg via ORAL
  Filled 2014-05-03 (×20): qty 2

## 2014-05-03 MED ORDER — SODIUM CHLORIDE 0.9 % IJ SOLN
INTRAMUSCULAR | Status: DC | PRN
  Administered 2014-05-03: 12:00:00 via TOPICAL

## 2014-05-03 MED ORDER — ASPIRIN EC 325 MG PO TBEC
325.0000 mg | DELAYED_RELEASE_TABLET | Freq: Every day | ORAL | Status: DC
  Administered 2014-05-04 – 2014-05-05 (×2): 325 mg via ORAL
  Filled 2014-05-03 (×3): qty 1

## 2014-05-03 MED ORDER — METOPROLOL TARTRATE 1 MG/ML IV SOLN: 2.5000 mg | INTRAVENOUS | Status: DC | PRN

## 2014-05-03 MED ORDER — ROCURONIUM BROMIDE 100 MG/10ML IV SOLN
INTRAVENOUS | Status: DC | PRN
  Administered 2014-05-03 (×5): 50 mg via INTRAVENOUS

## 2014-05-03 MED ORDER — ACETAMINOPHEN 160 MG/5ML PO SOLN: 1000.0000 mg | Freq: Four times a day (QID) | ORAL | Status: DC

## 2014-05-03 MED ORDER — CEFUROXIME SODIUM 1.5 G IJ SOLR
1.5000 g | Freq: Two times a day (BID) | INTRAMUSCULAR | Status: AC
  Administered 2014-05-03 – 2014-05-05 (×4): 1.5 g via INTRAVENOUS
  Filled 2014-05-03 (×4): qty 1.5

## 2014-05-03 MED ORDER — OXYCODONE HCL 5 MG PO TABS
5.0000 mg | ORAL_TABLET | ORAL | Status: DC | PRN
Start: 2014-05-03 — End: 2014-05-08
  Administered 2014-05-04 (×3): 10 mg via ORAL
  Filled 2014-05-03 (×3): qty 2

## 2014-05-03 MED ORDER — POTASSIUM CHLORIDE 10 MEQ/50ML IV SOLN: 10.0000 meq | INTRAVENOUS | Status: AC

## 2014-05-03 MED ORDER — PROPOFOL 10 MG/ML IV BOLUS
INTRAVENOUS | Status: AC
  Filled 2014-05-03: qty 20

## 2014-05-03 SURGICAL SUPPLY — 151 items
ADAPTER CARDIO PERF ANTE/RETRO (ADAPTER) ×3 IMPLANT
ADPR PRFSN 84XANTGRD RTRGD (ADAPTER) ×2
APL SKNCLS STERI-STRIP NONHPOA (GAUZE/BANDAGES/DRESSINGS) ×2
APPLIER CLIP 9.375 MED OPEN (MISCELLANEOUS)
APPLIER CLIP 9.375 SM OPEN (CLIP)
APR CLP MED 9.3 20 MLT OPN (MISCELLANEOUS)
APR CLP SM 9.3 20 MLT OPN (CLIP)
ATTRACTOMAT 16X20 MAGNETIC DRP (DRAPES) ×3 IMPLANT
BAG DECANTER FOR FLEXI CONT (MISCELLANEOUS) ×3 IMPLANT
BANDAGE ELASTIC 4 VELCRO ST LF (GAUZE/BANDAGES/DRESSINGS) ×3 IMPLANT
BANDAGE ELASTIC 6 VELCRO ST LF (GAUZE/BANDAGES/DRESSINGS) ×3 IMPLANT
BANDAGE GAUZE ELAST BULKY 4 IN (GAUZE/BANDAGES/DRESSINGS) ×3 IMPLANT
BASKET HEART (ORDER IN 25'S) (MISCELLANEOUS) ×1
BASKET HEART (ORDER IN 25S) (MISCELLANEOUS) ×2 IMPLANT
BENZOIN TINCTURE PRP APPL 2/3 (GAUZE/BANDAGES/DRESSINGS) ×3 IMPLANT
BLADE STERNUM SYSTEM 6 (BLADE) ×3 IMPLANT
BLADE SURG 11 STRL SS (BLADE) ×3 IMPLANT
BLADE SURG ROTATE 9660 (MISCELLANEOUS) IMPLANT
CANISTER SUCTION 2500CC (MISCELLANEOUS) ×3 IMPLANT
CANNULA EZ GLIDE AORTIC 21FR (CANNULA) ×3 IMPLANT
CANNULA GUNDRY RCSP 15FR (MISCELLANEOUS) ×3 IMPLANT
CANNULA SOFTFLOW AORTIC 7M21FR (CANNULA) ×3 IMPLANT
CANNULA VENOUS LOW PROF 34X46 (CANNULA) ×3 IMPLANT
CANNULA VESSEL 3MM BLUNT TIP (CANNULA) ×1 IMPLANT
CARDIAC SUCTION (MISCELLANEOUS) ×3 IMPLANT
CATH CPB KIT OWEN (MISCELLANEOUS) ×3 IMPLANT
CATH HEART VENT LEFT (CATHETERS) ×2 IMPLANT
CATH THORACIC 28FR (CATHETERS) IMPLANT
CATH THORACIC 28FR RT ANG (CATHETERS) IMPLANT
CATH THORACIC 36FR (CATHETERS) ×3 IMPLANT
CATH THORACIC 36FR RT ANG (CATHETERS) ×3 IMPLANT
CLIP APPLIE 9.375 MED OPEN (MISCELLANEOUS) IMPLANT
CLIP APPLIE 9.375 SM OPEN (CLIP) IMPLANT
CLIP FOGARTY SPRING 6M (CLIP) IMPLANT
CLIP RETRACTION 3.0MM CORONARY (MISCELLANEOUS) ×1 IMPLANT
CLIP TI MEDIUM 24 (CLIP) IMPLANT
CLIP TI WIDE RED SMALL 24 (CLIP) IMPLANT
CONN ST 1/4X3/8  BEN (MISCELLANEOUS) ×2
CONN ST 1/4X3/8 BEN (MISCELLANEOUS) IMPLANT
CONN Y 3/8X3/8X3/8  BEN (MISCELLANEOUS)
CONN Y 3/8X3/8X3/8 BEN (MISCELLANEOUS) IMPLANT
CONT SPEC 4OZ CLIKSEAL STRL BL (MISCELLANEOUS) ×1 IMPLANT
COVER SURGICAL LIGHT HANDLE (MISCELLANEOUS) ×3 IMPLANT
CRADLE DONUT ADULT HEAD (MISCELLANEOUS) ×3 IMPLANT
DEVICE SUT CK QUICK LOAD INDV (Prosthesis & Implant Heart) ×2 IMPLANT
DEVICE SUT CK QUICK LOAD MINI (Prosthesis & Implant Heart) ×1 IMPLANT
DRAIN CHANNEL 32F RND 10.7 FF (WOUND CARE) ×3 IMPLANT
DRAPE BILATERAL SPLIT (DRAPES) IMPLANT
DRAPE CARDIOVASCULAR INCISE (DRAPES) ×3
DRAPE CV SPLIT W-CLR ANES SCRN (DRAPES) IMPLANT
DRAPE INCISE IOBAN 66X45 STRL (DRAPES) ×3 IMPLANT
DRAPE SLUSH/WARMER DISC (DRAPES) ×3 IMPLANT
DRAPE SRG 135X102X78XABS (DRAPES) ×2 IMPLANT
DRSG AQUACEL AG ADV 3.5X14 (GAUZE/BANDAGES/DRESSINGS) ×1 IMPLANT
DRSG COVADERM 4X14 (GAUZE/BANDAGES/DRESSINGS) ×3 IMPLANT
ELECT BLADE 4.0 EZ CLEAN MEGAD (MISCELLANEOUS) ×3
ELECT REM PT RETURN 9FT ADLT (ELECTROSURGICAL) ×6
ELECTRODE BLDE 4.0 EZ CLN MEGD (MISCELLANEOUS) IMPLANT
ELECTRODE REM PT RTRN 9FT ADLT (ELECTROSURGICAL) ×4 IMPLANT
GLOVE BIO SURGEON STRL SZ 6 (GLOVE) ×4 IMPLANT
GLOVE BIO SURGEON STRL SZ 6.5 (GLOVE) IMPLANT
GLOVE BIO SURGEON STRL SZ7 (GLOVE) IMPLANT
GLOVE BIO SURGEON STRL SZ7.5 (GLOVE) ×1 IMPLANT
GLOVE BIOGEL PI IND STRL 6 (GLOVE) IMPLANT
GLOVE BIOGEL PI IND STRL 6.5 (GLOVE) IMPLANT
GLOVE BIOGEL PI IND STRL 7.0 (GLOVE) IMPLANT
GLOVE BIOGEL PI INDICATOR 6 (GLOVE)
GLOVE BIOGEL PI INDICATOR 6.5 (GLOVE) ×3
GLOVE BIOGEL PI INDICATOR 7.0 (GLOVE) ×2
GLOVE EUDERMIC 7 POWDERFREE (GLOVE) IMPLANT
GLOVE ORTHO TXT STRL SZ7.5 (GLOVE) ×9 IMPLANT
GOWN STRL REUS W/ TWL LRG LVL3 (GOWN DISPOSABLE) ×8 IMPLANT
GOWN STRL REUS W/TWL LRG LVL3 (GOWN DISPOSABLE) ×18
HEMOSTAT POWDER SURGIFOAM 1G (HEMOSTASIS) ×9 IMPLANT
INSERT FOGARTY 61MM (MISCELLANEOUS) IMPLANT
INSERT FOGARTY XLG (MISCELLANEOUS) ×3 IMPLANT
KIT BASIN OR (CUSTOM PROCEDURE TRAY) ×3 IMPLANT
KIT ROOM TURNOVER OR (KITS) ×3 IMPLANT
KIT SUCTION CATH 14FR (SUCTIONS) ×15 IMPLANT
KIT SUT CK MINI COMBO 4X17 (Prosthesis & Implant Heart) ×1 IMPLANT
KIT VASOVIEW W/TROCAR VH 2000 (KITS) ×3 IMPLANT
LEAD PACING MYOCARDI (MISCELLANEOUS) ×3 IMPLANT
LINE VENT (MISCELLANEOUS) ×1 IMPLANT
MARKER GRAFT CORONARY BYPASS (MISCELLANEOUS) ×9 IMPLANT
NS IRRIG 1000ML POUR BTL (IV SOLUTION) ×15 IMPLANT
PACK OPEN HEART (CUSTOM PROCEDURE TRAY) ×3 IMPLANT
PAD ARMBOARD 7.5X6 YLW CONV (MISCELLANEOUS) ×6 IMPLANT
PAD ELECT DEFIB RADIOL ZOLL (MISCELLANEOUS) ×3 IMPLANT
PENCIL BUTTON HOLSTER BLD 10FT (ELECTRODE) ×3 IMPLANT
PUNCH AORTIC ROTATE 4.0MM (MISCELLANEOUS) IMPLANT
PUNCH AORTIC ROTATE 4.5MM 8IN (MISCELLANEOUS) IMPLANT
PUNCH AORTIC ROTATE 5MM 8IN (MISCELLANEOUS) IMPLANT
SET CARDIOPLEGIA MPS 5001102 (MISCELLANEOUS) ×1 IMPLANT
SET IRRIG TUBING LAPAROSCOPIC (IRRIGATION / IRRIGATOR) ×3 IMPLANT
SOLUTION ANTI FOG 6CC (MISCELLANEOUS) IMPLANT
SPONGE GAUZE 4X4 12PLY (GAUZE/BANDAGES/DRESSINGS) ×6 IMPLANT
SPONGE GAUZE 4X4 12PLY STER LF (GAUZE/BANDAGES/DRESSINGS) ×1 IMPLANT
SPONGE LAP 18X18 X RAY DECT (DISPOSABLE) IMPLANT
SPONGE LAP 4X18 X RAY DECT (DISPOSABLE) IMPLANT
SUT BONE WAX W31G (SUTURE) ×3 IMPLANT
SUT ETHIBON 2 0 V 52N 30 (SUTURE) ×6 IMPLANT
SUT ETHIBON EXCEL 2-0 V-5 (SUTURE) IMPLANT
SUT ETHIBOND 2 0 SH (SUTURE)
SUT ETHIBOND 2 0 SH 36X2 (SUTURE) IMPLANT
SUT ETHIBOND 2 0 V4 (SUTURE) IMPLANT
SUT ETHIBOND 2 0V4 GREEN (SUTURE) IMPLANT
SUT ETHIBOND 4 0 RB 1 (SUTURE) IMPLANT
SUT ETHIBOND V-5 VALVE (SUTURE) IMPLANT
SUT ETHIBOND X763 2 0 SH 1 (SUTURE) ×9 IMPLANT
SUT MNCRL AB 3-0 PS2 18 (SUTURE) ×6 IMPLANT
SUT MNCRL AB 4-0 PS2 18 (SUTURE) IMPLANT
SUT PDS AB 1 CTX 36 (SUTURE) ×6 IMPLANT
SUT PROLENE 2 0 SH DA (SUTURE) IMPLANT
SUT PROLENE 3 0 SH DA (SUTURE) ×3 IMPLANT
SUT PROLENE 3 0 SH1 36 (SUTURE) IMPLANT
SUT PROLENE 4 0 RB 1 (SUTURE) ×9
SUT PROLENE 4 0 SH DA (SUTURE) ×3 IMPLANT
SUT PROLENE 4-0 RB1 .5 CRCL 36 (SUTURE) ×4 IMPLANT
SUT PROLENE 5 0 C 1 36 (SUTURE) IMPLANT
SUT PROLENE 6 0 C 1 30 (SUTURE) ×1 IMPLANT
SUT PROLENE 7.0 RB 3 (SUTURE) ×9 IMPLANT
SUT PROLENE 8 0 BV175 6 (SUTURE) IMPLANT
SUT PROLENE BLUE 7 0 (SUTURE) ×3 IMPLANT
SUT PROLENE POLY MONO (SUTURE) IMPLANT
SUT SILK  1 MH (SUTURE) ×1
SUT SILK 1 MH (SUTURE) ×2 IMPLANT
SUT SILK 2 0 SH CR/8 (SUTURE) IMPLANT
SUT SILK 3 0 SH CR/8 (SUTURE) IMPLANT
SUT STEEL 6MS V (SUTURE) IMPLANT
SUT STEEL STERNAL CCS#1 18IN (SUTURE) IMPLANT
SUT STEEL SZ 6 DBL 3X14 BALL (SUTURE) IMPLANT
SUT VIC AB 1 CTX 36 (SUTURE)
SUT VIC AB 1 CTX36XBRD ANBCTR (SUTURE) IMPLANT
SUT VIC AB 2-0 CT1 27 (SUTURE)
SUT VIC AB 2-0 CT1 TAPERPNT 27 (SUTURE) IMPLANT
SUT VIC AB 2-0 CTX 27 (SUTURE) IMPLANT
SUT VIC AB 3-0 SH 27 (SUTURE)
SUT VIC AB 3-0 SH 27X BRD (SUTURE) IMPLANT
SUT VIC AB 3-0 X1 27 (SUTURE) IMPLANT
SUT VICRYL 4-0 PS2 18IN ABS (SUTURE) IMPLANT
SUTURE E-PAK OPEN HEART (SUTURE) ×3 IMPLANT
SYSTEM SAHARA CHEST DRAIN ATS (WOUND CARE) ×3 IMPLANT
TAPE CLOTH SURG 4X10 WHT LF (GAUZE/BANDAGES/DRESSINGS) ×1 IMPLANT
TOWEL OR 17X24 6PK STRL BLUE (TOWEL DISPOSABLE) ×6 IMPLANT
TOWEL OR 17X26 10 PK STRL BLUE (TOWEL DISPOSABLE) ×6 IMPLANT
TRAY FOLEY IC TEMP SENS 16FR (CATHETERS) ×3 IMPLANT
TUBING INSUFFLATION 10FT LAP (TUBING) ×3 IMPLANT
UNDERPAD 30X30 INCONTINENT (UNDERPADS AND DIAPERS) ×3 IMPLANT
VALVE MAGNA EASE AORTIC 25MM (Prosthesis & Implant Heart) ×1 IMPLANT
VENT LEFT HEART 12002 (CATHETERS) ×3
WATER STERILE IRR 1000ML POUR (IV SOLUTION) ×6 IMPLANT

## 2014-05-03 NOTE — Progress Notes (Signed)
Pt placed back in SIMV at this time due to ABG results

## 2014-05-03 NOTE — OR Nursing (Signed)
SICU first call @ 1133

## 2014-05-03 NOTE — Transfer of Care (Signed)
Immediate Anesthesia Transfer of Care Note  Patient: Travis Armstrong  Procedure(s) Performed: Procedure(s) with comments: AORTIC VALVE REPLACEMENT (AVR) (N/A) CORONARY ARTERY BYPASS GRAFTING (CABG) (N/A) - Times 1 using left internal mammary artery. INTRAOPERATIVE TRANSESOPHAGEAL ECHOCARDIOGRAM (N/A)  Patient Location: SICU  Anesthesia Type:General  Level of Consciousness: sedated, unresponsive and Patient remains intubated per anesthesia plan  Airway & Oxygen Therapy: Patient remains intubated per anesthesia plan and Patient placed on Ventilator (see vital sign flow sheet for setting)  Post-op Assessment: Report given to PACU RN and Post -op Vital signs reviewed and stable  Post vital signs: Reviewed and stable  Complications: No apparent anesthesia complications

## 2014-05-03 NOTE — Anesthesia Postprocedure Evaluation (Signed)
Anesthesia Post Note  Patient: Travis Armstrong Reading  Procedure(s) Performed: Procedure(s) (LRB): AORTIC VALVE REPLACEMENT (AVR) (N/A) CORONARY ARTERY BYPASS GRAFTING (CABG) (N/A) INTRAOPERATIVE TRANSESOPHAGEAL ECHOCARDIOGRAM (N/A)  Anesthesia type: General  Patient location: ICU  Post pain: Pain level controlled  Post assessment: Post-op Vital signs reviewed  Last Vitals:  Filed Vitals:   05/03/14 1241  BP: 136/65  Pulse: 80  Temp:   Resp:     Post vital signs: stable  Level of consciousness: Patient remains intubated per anesthesia plan  Complications: No apparent anesthesia complications

## 2014-05-03 NOTE — Progress Notes (Signed)
Patient ID: Travis Armstrong, male   DOB: [birth year], 73 y.o.   MRN: [id number] EVENING ROUNDS NOTE :     [address]       [place] [address]             [phone]                 Day of Surgery Procedure(s) (LRB): AORTIC VALVE REPLACEMENT (AVR) (N/A) CORONARY ARTERY BYPASS GRAFTING (CABG) (N/A) INTRAOPERATIVE TRANSESOPHAGEAL ECHOCARDIOGRAM (N/A)  Total Length of Stay:  LOS: 0 days  BP 137/55  Pulse 80  Temp(Src) 99.7 F (37.6 C) (Oral)  Resp 23  Ht 5\' 11"  (1.803 m)  Wt 203 lb (92.08 kg)  BMI 28.33 kg/m2  SpO2 100%  .Intake/Output     05/15 0701 - 05/16 0700   I.V. (mL/kg) 4617.5 (50.1)   Blood 500   IV Piggyback 900   Total Intake(mL/kg) 6017.5 (65.4)   Urine (mL/kg/hr) 4175 (3.5)   Blood 1570 (1.3)   Chest Tube 480 (0.4)   Total Output 6225   Net -207.5         . sodium chloride 20 mL/hr at 05/03/14 1900  . sodium chloride 20 mL/hr at 05/03/14 1900  . sodium chloride    . sodium chloride 100 mL/hr at 05/03/14 1900  . dexmedetomidine Stopped (05/03/14 1415)  . insulin (NOVOLIN-R) infusion 1.2 Units/hr (05/03/14 1900)  . lactated ringers 20 mL/hr at 05/03/14 1900  . nitroGLYCERIN Stopped (05/03/14 1930)  . phenylephrine (NEO-SYNEPHRINE) Adult infusion 0 mcg/min (05/03/14 1230)     Lab Results  Component Value Date   WBC 8.7 05/03/2014   HGB 9.8* 05/03/2014   HCT 27.5* 05/03/2014   PLT 77* 05/03/2014   GLUCOSE 118* 05/03/2014   CHOL 85 04/08/2014   TRIG 85.0 04/08/2014   HDL 39.10 04/08/2014   LDLCALC 29 04/08/2014   ALT 24 04/29/2014   AST 33 04/29/2014   NA 143 05/03/2014   K 3.9 05/03/2014   CL 107 05/03/2014   CREATININE 1.09 05/03/2014   BUN 14 05/03/2014   CO2 23 04/29/2014   INR 1.58* 05/03/2014   HGBA1C 5.7* 04/29/2014   Waking up developed respiratory acidosis with weaning vent, rate turned back Not bleeding  Wean again when more awake  Grace Isaac MD  Beeper [phone] Office [phone] 05/03/2014 7:53 PM

## 2014-05-03 NOTE — Progress Notes (Signed)
  Echocardiogram Echocardiogram Transesophageal has been performed.  Travis Armstrong 05/03/2014, 8:26 AM

## 2014-05-03 NOTE — Brief Op Note (Signed)
05/03/2014  12:10 PM  PATIENT:  Travis Armstrong  73 y.o. male  PRE-OPERATIVE DIAGNOSIS:  AS CAD  POST-OPERATIVE DIAGNOSIS:  AS CAD  PROCEDURE:  Procedure(s) with comments: AORTIC VALVE REPLACEMENT (AVR) (N/A) CORONARY ARTERY BYPASS GRAFTING (CABG) (N/A) - Times 1 using left internal mammary artery. INTRAOPERATIVE TRANSESOPHAGEAL ECHOCARDIOGRAM (N/A)  SURGEON:    Rexene Alberts, MD  ASSISTANTS:  Vernie Murders, CRNFA  ANESTHESIA:   Lillia Abed, MD  CROSSCLAMP TIME:   80'  CARDIOPULMONARY BYPASS TIME: 90'  FINDINGS:  Bicuspid aortic valve with severe aortic stenosis  Normal LV systolic function  Good quality LIMA conduit for grafting  Good quality target vessel for grafting  Aortic Valve Etiology   Aortic Insufficiency:  Trivial/Trace  Aortic Valve Disease:  Yes.  Aortic Stenosis:  Yes. Smallest Aortic Valve Area: 0.9cm2; Highest Mean Gradient: 86mmHg.  Etiology (Choose at least one and up to  5 etiologies):  Bicuspid valve disease  Aortic Valve  Procedure Performed:  Replacement: Yes.  Bioprosthetic Valve. Implant Model Number:[id number], Size:25, Unique Device Identifier:[id number]  [employer] Ease  Repair/Reconstruction: No.   Aortic Annular Enlargement: No.    COMPLICATIONS: None  BASELINE WEIGHT: 92 kg  PATIENT DISPOSITION:   TO SICU IN STABLE CONDITION  Rexene Alberts 05/03/2014 12:10 PM

## 2014-05-03 NOTE — Procedures (Signed)
Extubation Procedure Note  Patient Details:   Name: Travis Armstrong DOB: 1941-01-11 MRN: [id number]   Airway Documentation:  Airway 8.5 mm (Active)  Secured at (cm) 22 cm 05/03/2014  7:36 PM  Measured From Lips 05/03/2014  7:36 PM  Secured Location Right 05/03/2014  7:36 PM  Secured By Pink Tape 05/03/2014  7:36 PM  Cuff Pressure (cm H2O) 22 cm H2O 05/03/2014 12:41 PM  Site Condition Dry 05/03/2014  7:36 PM    Evaluation  O2 sats: stable throughout Complications: No apparent complications Patient did tolerate procedure well. Bilateral Breath Sounds: Clear Suctioning: Airway and oral Yes, pt able to speak in a whispered voice.   NIF -20 VC 959mls pt able to follow commands, lift head off pillow and stick out tongue. Pt extubated to 4L [place] BBS clear, no stridor present. HR 74 RR 12 sats 100% will continue to monitor.   Janann August 05/03/2014, 10:47 PM

## 2014-05-03 NOTE — Progress Notes (Signed)
Utilization review completed. Basheer Molchan, RN, BSN. 

## 2014-05-03 NOTE — Anesthesia Preprocedure Evaluation (Addendum)
Anesthesia Evaluation  Patient identified by MRN, date of birth, ID band Patient awake    Reviewed: Allergy & Precautions, H&P , NPO status , Patient's Chart, lab work & pertinent test results, reviewed documented beta blocker date and time   Airway Mallampati: II TM Distance: >3 FB Neck ROM: Full    Dental  (+) Dental Advisory Given, Teeth Intact   Pulmonary former smoker,          Cardiovascular hypertension, Pt. on medications and Pt. on home beta blockers + angina + CAD and + Cardiac Stents + Valvular Problems/Murmurs AS   04/12/14 2D Echo:  Study Conclusions  - Left ventricle: The cavity size was normal. Wall thickness was increased in a pattern of moderate LVH. Systolic function was normal. The estimated ejection fraction was in the range of 60% to 65%. Wall motion was normal; there were no regional wall motion abnormalities. - Aortic valve: There was moderate to severe stenosis. Valve area: 0.67cm^2(VTI). Valve area: 0.71cm^2 (Vmax). - Mitral valve: Mild to moderate regurgitation.   Neuro/Psych    GI/Hepatic   Endo/Other    Renal/GU      Musculoskeletal  (+) Arthritis -,   Abdominal   Peds  Hematology   Anesthesia Other Findings   Reproductive/Obstetrics                       Anesthesia Physical Anesthesia Plan  ASA: III  Anesthesia Plan: General   Post-op Pain Management:    Induction: Intravenous  Airway Management Planned: Oral ETT  Additional Equipment: Arterial line, CVP, PA Cath, Ultrasound Guidance Line Placement and TEE  Intra-op Plan:   Post-operative Plan: Post-operative intubation/ventilation  Informed Consent: I have reviewed the patients History and Physical, chart, labs and discussed the procedure including the risks, benefits and alternatives for the proposed anesthesia with the patient or authorized representative who has indicated his/her understanding and  acceptance.   Dental advisory given  Plan Discussed with: CRNA and Surgeon  Anesthesia Plan Comments:        Anesthesia Quick Evaluation

## 2014-05-03 NOTE — Interval H&P Note (Signed)
History and Physical Interval Note:  05/03/2014 6:43 AM  Travis Armstrong  has presented today for surgery, with the diagnosis of AS CAD  The various methods of treatment have been discussed with the patient and family. After consideration of risks, benefits and other options for treatment, the patient has consented to  Procedure(s): AORTIC VALVE REPLACEMENT (AVR) (N/A) CORONARY ARTERY BYPASS GRAFTING (CABG) (N/A) INTRAOPERATIVE TRANSESOPHAGEAL ECHOCARDIOGRAM (N/A) as a surgical intervention .  The patient's history has been reviewed, patient examined, no change in status, stable for surgery.  I have reviewed the patient's chart and labs.  Questions were answered to the patient's satisfaction.     Rexene Alberts

## 2014-05-03 NOTE — Op Note (Signed)
CARDIOTHORACIC SURGERY OPERATIVE NOTE  Date of Procedure:  05/03/2014  Preoperative Diagnosis:   Severe Aortic Stenosis  Severe Single-vessel Coronary Artery Disease  Postoperative Diagnosis: Same  Procedure:    Aortic Valve Replacement  [facility] Ease Pericardial Tissue Valve (size 18mm, model # 3300TFX, serial # [id number])   Coronary Artery Bypass Grafting x 1  Left Internal Mammary Artery to Distal Left Anterior Descending Coronary Artery  Surgeon: Valentina Gu. Roxy Manns, MD  Assistant: Vernie Murders, CRNFA  Anesthesia: Lillia Abed, MD  Operative Findings: Bicuspid aortic valve with severe aortic stenosis  Normal LV systolic function  Good quality LIMA conduit for grafting  Good quality target vessel for grafting           BRIEF CLINICAL NOTE AND INDICATIONS FOR SURGERY  Patient is a 73 year old retired white male from [place] referred for possible surgical treatment of bicuspid aortic valve with aortic stenosis and severe single-vessel coronary artery disease. The patient's cardiac history dates back to 20012 when he presented with symptoms of exertional angina. He was noted to have a heart murmur on physical exam but echocardiogram revealed a bicuspid aortic valve but only mild aortic stenosis. MRA of the chest was notable for the absence of significant aneurysmal enlargement of the ascending thoracic aorta. The patient underwent cardiac catheterization and was found to have high-grade stenosis of the left anterior descending coronary artery. He was treated with PCI and stenting using a drug-eluting stent. He did well for a period of time, but he develop recurrent angina in October of 2013. Followup catheterization revealed restenosis in the proximal left anterior descending coronary artery. He underwent repeat PCI and stenting with another drug-eluting stent. The patient has continued to follow closely with Dr. Aundra Dubin, and over the past 2 years he has demonstrated  progression of severity of aortic stenosis. Followup transthoracic and transesophageal echocardiogram performed in January of this year demonstrates what appears to be bicuspid native aortic valve with at least moderate aortic stenosis with preserved LV systolic function. Over the past 2 months the patient has developed recurrent symptoms of exertional chest tightness consistent with angina pectoris. He has also experienced increasing fatigue, but he continues to deny symptoms of exertional shortness of breath. He develops chest tightness with walking or strenuous exertion, but symptoms are typically resolve quickly by rest. He denies any history PND, orthopnea, lower extremity edema, tachypalpitations, dizzy spells, or syncope. He subsequently underwent left and right heart catheterization. This revealed restenosis of the mid left anterior descending coronary artery within the previous stents. There has not been any progression of other significant coronary artery disease. The patient was referred for elective surgical consultation.  The patient has been seen in consultation and counseled at length regarding the indications, risks and potential benefits of surgery.  All questions have been answered, and the patient provides full informed consent for the operation as described.     DETAILS OF THE OPERATIVE PROCEDURE  Preparation:  The patient is brought to the operating room on the above mentioned date and central monitoring was established by the anesthesia team including placement of Swan-Ganz catheter and radial arterial line. The patient is placed in the supine position on the operating table.  Intravenous antibiotics are administered. General endotracheal anesthesia is induced uneventfully. A Foley catheter is placed.  Baseline transesophageal echocardiogram was performed.  Findings were notable for bicuspid native aortic valve with severe aortic stenosis.  There was normal LV systolic function.   There was mild mitral regurgitation.  The patient's  chest, abdomen, both groins, and both lower extremities are prepared and draped in a sterile manner. A time out procedure is performed.   Surgical Approach and Conduit Harvest:  A median sternotomy incision was performed and the left internal mammary artery is dissected from the chest wall and prepared for bypass grafting. The left internal mammary artery is notably good quality conduit.  Following systemic heparinization, the left internal mammary artery was transected distally noted to have excellent flow.   Extracorporeal Cardiopulmonary Bypass and Myocardial Protection:  The pericardium is opened. The ascending aorta is normal in appearance. The ascending aorta and the right atrium are cannulated for cardioplegia bypass.  Adequate heparinization is verified.   A retrograde cardioplegia cannula is placed through the right atrium into the coronary sinus.  The operative field was continuously flooded with carbon dioxide gas.  The entire pre-bypass portion of the operation was notable for stable hemodynamics.  Cardiopulmonary bypass was begun and a left ventricular vent placed through the right superior pulmonary vein.  The surface of the heart inspected. Distal target vessels are selected for coronary artery bypass grafting. A cardioplegia cannula is placed in the ascending aorta.  A temperature probe was placed in the interventricular septum.  The patient is cooled to 32C systemic temperature.  The aortic cross clamp is applied and cold blood cardioplegia is delivered initially in an antegrade fashion through the aortic root.   Supplemental cardioplegia is given retrograde through the coronary sinus catheter.  Iced saline slush is applied for topical hypothermia.  The initial cardioplegic arrest is rapid with early diastolic arrest.  Repeat doses of cardioplegia are administered intermittently throughout the entire cross clamp portion of the  operation through the aortic root and through the coronary sinus catheter in order to maintain completely flat electrocardiogram and septal myocardial temperature below 15C.  Myocardial protection was felt to be  excellent.   Coronary Artery Bypass Grafting:   The distal left anterior coronary artery was grafted with the left internal mammary artery in an end-to-side fashion.  At the site of distal anastomosis the target vessel was good quality and measured approximately 1.5 mm in diameter.   Aortic Valve Replacement:  An oblique transverse aortotomy incision was performed.  The aortic valve was inspected and notable for bicuspid valve with severe aortic stenosis.  There was fusion of the left and right cusps of the valve.  The aortic valve leaflets were excised sharply and the aortic annulus decalcified.  Decalcification was notably straightforward.  The aortic annulus was sized to accept a 25 mm prosthesis.  The aortic root and left ventricle were irrigated with copious cold saline solution.  Aortic valve replacement was performed using interrupted horizontal mattress 2-0 Ethibond pledgeted sutures with pledgets in the subannular position.  An [facility] Ease pericardial tissue valve (size 25 mm, model # 3300TFX, serial # [id number]) was implanted uneventfully. The valve seated appropriately with adequate space beneath the left main and right coronary artery.  The aortotomy was closed using a 2-layer closure of running 4-0 Prolene suture.   Procedure Completion:  All proximal vein graft anastomoses were placed directly to the ascending aorta prior to removal of the aortic cross clamp.  The septal myocardial temperature rose rapidly after reperfusion of the left internal mammary artery graft.  One final dose of warm retrograde "hot shot" cardioplegia was administered through the coronary sinus catheter while all air was evacuated through the aortic root.  The aortic cross clamp was removed  after a  total cross clamp time of 76 minutes.  All proximal and distal coronary anastomoses were inspected for hemostasis and appropriate graft orientation. Epicardial pacing wires are fixed to the right ventricular outflow tract and to the right atrial appendage. The patient is rewarmed to 37C temperature. The aortic and left ventricular vents were removed.  The patient is weaned and disconnected from cardiopulmonary bypass.  The patient's rhythm at separation from bypass was AV paced.  The patient was weaned from cardioplegic bypass without any inotropic support. Total cardiopulmonary bypass time for the operation was 94 minutes.  Followup transesophageal echocardiogram performed after separation from bypass revealed a well-seated bioprosthetic tissue valve in the aortic position that was functioning normally.  There was no perivalvular leak.  There were otherwise no changes from the preoperative exam.  The aortic and venous cannula were removed uneventfully. Protamine was administered to reverse the anticoagulation. The mediastinum and pleural space were inspected for hemostasis and irrigated with saline solution. The mediastinum and the left pleural space were drained using 3 chest tubes placed through separate stab incisions inferiorly.  The soft tissues anterior to the aorta were reapproximated loosely. The sternum is closed with double strength sternal wire. The soft tissues anterior to the sternum were closed in multiple layers and the skin is closed with a running subcuticular skin closure.  The post-bypass portion of the operation was notable for stable rhythm and hemodynamics.  No blood products were administered during the operation.   Disposition:  The patient tolerated the procedure well and is transported to the surgical intensive care in stable condition. There are no intraoperative complications. All sponge instrument and needle counts are verified correct at completion of the  operation.   Valentina Gu. Roxy Manns MD 05/03/2014 12:16 PM

## 2014-05-03 NOTE — Anesthesia Procedure Notes (Signed)
Procedure Name: Intubation Date/Time: 05/03/2014 8:06 AM Performed by: Blair Heys E Pre-anesthesia Checklist: Patient identified, Emergency Drugs available, Suction available and Patient being monitored Patient Re-evaluated:Patient Re-evaluated prior to inductionOxygen Delivery Method: Circle system utilized Preoxygenation: Pre-oxygenation with 100% oxygen Intubation Type: IV induction Ventilation: Mask ventilation without difficulty Laryngoscope Size: Miller and 2 Grade View: Grade II Tube type: Oral Tube size: 8.5 mm Number of attempts: 1 Airway Equipment and Method: Stylet Placement Confirmation: ETT inserted through vocal cords under direct vision,  positive ETCO2 and breath sounds checked- equal and bilateral Secured at: 23 cm Tube secured with: Tape Dental Injury: Teeth and Oropharynx as per pre-operative assessment

## 2014-05-04 ENCOUNTER — Inpatient Hospital Stay (HOSPITAL_COMMUNITY): Payer: Medicare Other

## 2014-05-04 LAB — POCT I-STAT 3, ART BLOOD GAS (G3+)
Acid-base deficit: 3 mmol/L — ABNORMAL HIGH (ref 0.0–2.0)
Acid-base deficit: 3 mmol/L — ABNORMAL HIGH (ref 0.0–2.0)
BICARBONATE: 23.2 meq/L (ref 20.0–24.0)
Bicarbonate: 21.7 mEq/L (ref 20.0–24.0)
O2 SAT: 99 %
O2 Saturation: 98 %
PH ART: 7.314 — AB (ref 7.350–7.450)
PH ART: 7.404 (ref 7.350–7.450)
PO2 ART: 126 mmHg — AB (ref 80.0–100.0)
TCO2: 23 mmol/L (ref 0–100)
TCO2: 25 mmol/L (ref 0–100)
pCO2 arterial: 34.9 mmHg — ABNORMAL LOW (ref 35.0–45.0)
pCO2 arterial: 45.8 mmHg — ABNORMAL HIGH (ref 35.0–45.0)
pO2, Arterial: 142 mmHg — ABNORMAL HIGH (ref 80.0–100.0)

## 2014-05-04 LAB — CREATININE, SERUM
CREATININE: 1.03 mg/dL (ref 0.50–1.35)
GFR calc Af Amer: 81 mL/min — ABNORMAL LOW (ref >90)
GFR, EST NON AFRICAN AMERICAN: 70 mL/min — AB (ref >90)

## 2014-05-04 LAB — POCT I-STAT, CHEM 8
BUN: 16 mg/dL (ref 6–23)
CREATININE: 1.1 mg/dL (ref 0.50–1.35)
Calcium, Ion: 1.28 mmol/L (ref 1.13–1.30)
Chloride: 102 mEq/L (ref 96–112)
GLUCOSE: 159 mg/dL — AB (ref 70–99)
HCT: 25 % — ABNORMAL LOW (ref 39.0–52.0)
HEMOGLOBIN: 8.5 g/dL — AB (ref 13.0–17.0)
POTASSIUM: 3.9 meq/L (ref 3.7–5.3)
Sodium: 139 mEq/L (ref 137–147)
TCO2: 23 mmol/L (ref 0–100)

## 2014-05-04 LAB — BASIC METABOLIC PANEL
BUN: 15 mg/dL (ref 6–23)
CO2: 22 mEq/L (ref 19–32)
Calcium: 8.4 mg/dL (ref 8.4–10.5)
Chloride: 110 mEq/L (ref 96–112)
Creatinine, Ser: 1 mg/dL (ref 0.50–1.35)
GFR calc non Af Amer: 73 mL/min — ABNORMAL LOW (ref >90)
GFR, EST AFRICAN AMERICAN: 84 mL/min — AB (ref >90)
Glucose, Bld: 124 mg/dL — ABNORMAL HIGH (ref 70–99)
Potassium: 4.2 mEq/L (ref 3.7–5.3)
Sodium: 141 mEq/L (ref 137–147)

## 2014-05-04 LAB — GLUCOSE, CAPILLARY
GLUCOSE-CAPILLARY: 118 mg/dL — AB (ref 70–99)
GLUCOSE-CAPILLARY: 106 mg/dL — AB (ref 70–99)
GLUCOSE-CAPILLARY: 107 mg/dL — AB (ref 70–99)
GLUCOSE-CAPILLARY: 116 mg/dL — AB (ref 70–99)
GLUCOSE-CAPILLARY: 120 mg/dL — AB (ref 70–99)
GLUCOSE-CAPILLARY: 174 mg/dL — AB (ref 70–99)
GLUCOSE-CAPILLARY: 143 mg/dL — AB (ref 70–99)
GLUCOSE-CAPILLARY: 118 mg/dL — AB (ref 70–99)
GLUCOSE-CAPILLARY: 93 mg/dL (ref 70–99)
GLUCOSE-CAPILLARY: 128 mg/dL — AB (ref 70–99)
GLUCOSE-CAPILLARY: 119 mg/dL — AB (ref 70–99)
GLUCOSE-CAPILLARY: 120 mg/dL — AB (ref 70–99)
Glucose-Capillary: 121 mg/dL — ABNORMAL HIGH (ref 70–99)
Glucose-Capillary: 121 mg/dL — ABNORMAL HIGH (ref 70–99)
Glucose-Capillary: 122 mg/dL — ABNORMAL HIGH (ref 70–99)
Glucose-Capillary: 129 mg/dL — ABNORMAL HIGH (ref 70–99)
Glucose-Capillary: 134 mg/dL — ABNORMAL HIGH (ref 70–99)
Glucose-Capillary: 87 mg/dL (ref 70–99)
Glucose-Capillary: 92 mg/dL (ref 70–99)
Glucose-Capillary: 104 mg/dL — ABNORMAL HIGH (ref 70–99)
Glucose-Capillary: 146 mg/dL — ABNORMAL HIGH (ref 70–99)
Glucose-Capillary: 141 mg/dL — ABNORMAL HIGH (ref 70–99)

## 2014-05-04 LAB — CBC
HCT: 25 % — ABNORMAL LOW (ref 39.0–52.0)
HCT: 24.9 % — ABNORMAL LOW (ref 39.0–52.0)
Hemoglobin: 8.9 g/dL — ABNORMAL LOW (ref 13.0–17.0)
Hemoglobin: 8.7 g/dL — ABNORMAL LOW (ref 13.0–17.0)
MCH: 33 pg (ref 26.0–34.0)
MCH: 32.7 pg (ref 26.0–34.0)
MCHC: 35.6 g/dL (ref 30.0–36.0)
MCHC: 34.9 g/dL (ref 30.0–36.0)
MCV: 92.6 fL (ref 78.0–100.0)
MCV: 93.6 fL (ref 78.0–100.0)
PLATELETS: 72 10*3/uL — AB (ref 150–400)
Platelets: 73 10*3/uL — ABNORMAL LOW (ref 150–400)
RBC: 2.7 MIL/uL — ABNORMAL LOW (ref 4.22–5.81)
RBC: 2.66 MIL/uL — AB (ref 4.22–5.81)
RDW: 13.8 % (ref 11.5–15.5)
RDW: 13.9 % (ref 11.5–15.5)
WBC: 10.6 10*3/uL — ABNORMAL HIGH (ref 4.0–10.5)
WBC: 13.3 10*3/uL — ABNORMAL HIGH (ref 4.0–10.5)

## 2014-05-04 LAB — BLOOD GAS, ARTERIAL
Acid-base deficit: 2.5 mmol/L — ABNORMAL HIGH (ref 0.0–2.0)
BICARBONATE: 21.8 meq/L (ref 20.0–24.0)
DRAWN BY: 41308
O2 Content: 3 L/min
O2 Saturation: 99.7 %
Patient temperature: 98.6
TCO2: 23 mmol/L (ref 0–100)
pCO2 arterial: 37.7 mmHg (ref 35.0–45.0)
pH, Arterial: 7.381 (ref 7.350–7.450)
pO2, Arterial: 124 mmHg — ABNORMAL HIGH (ref 80.0–100.0)

## 2014-05-04 LAB — MAGNESIUM
MAGNESIUM: 2.2 mg/dL (ref 1.5–2.5)
Magnesium: 2.4 mg/dL (ref 1.5–2.5)

## 2014-05-04 MED ORDER — FUROSEMIDE 10 MG/ML IJ SOLN
40.0000 mg | Freq: Once | INTRAMUSCULAR | Status: AC
  Administered 2014-05-04: 40 mg via INTRAVENOUS

## 2014-05-04 MED ORDER — INSULIN ASPART 100 UNIT/ML ~~LOC~~ SOLN
0.0000 [IU] | SUBCUTANEOUS | Status: DC
  Administered 2014-05-04 – 2014-05-06 (×6): 2 [IU] via SUBCUTANEOUS

## 2014-05-04 MED ORDER — INSULIN ASPART 100 UNIT/ML ~~LOC~~ SOLN: 0.0000 [IU] | SUBCUTANEOUS | Status: DC

## 2014-05-04 NOTE — Progress Notes (Signed)
Patient ID: Travis Armstrong, male   DOB: 04-19-1941, 73 y.o.   MRN: [id number] TCTS DAILY ICU PROGRESS NOTE                   [address]            [place] [address]          [phone]   1 Day Post-Op Procedure(s) (LRB): AORTIC VALVE REPLACEMENT (AVR) (N/A) CORONARY ARTERY BYPASS GRAFTING (CABG) (N/A) INTRAOPERATIVE TRANSESOPHAGEAL ECHOCARDIOGRAM (N/A)  Total Length of Stay:  LOS: 1 day   Subjective: Extubated, awake and alert, neuro intatc  Objective: Vital signs in last 24 hours: Temp:  [95.2 F (35.1 C)-100 F (37.8 C)] 98.1 F (36.7 C) (05/16 1000) Pulse Rate:  [69-88] 75 (05/16 1000) Cardiac Rhythm:  [-] Normal sinus rhythm (05/16 0738) Resp:  [7-24] 15 (05/16 1000) BP: (102-155)/(51-81) 102/55 mmHg (05/16 1000) SpO2:  [98 %-100 %] 99 % (05/16 1000) Arterial Line BP: (103-166)/(40-78) 129/61 mmHg (05/16 1000) FiO2 (%):  [40 %-50 %] 40 % (05/15 2200) Weight:  [208 lb 15.9 oz (94.8 kg)] 208 lb 15.9 oz (94.8 kg) (05/16 0300)  Filed Weights   05/02/14 1300 05/03/14 0549 05/04/14 0300  Weight: 203 lb (92.08 kg) 203 lb (92.08 kg) 208 lb 15.9 oz (94.8 kg)    Weight change: 5 lb 15.9 oz (2.72 kg)   Hemodynamic parameters for last 24 hours: PAP: (17-42)/(1-29) 42/29 mmHg CO:  [3.3 L/min-6 L/min] 4.8 L/min CI:  [1.6 L/min/m2-2.8 L/min/m2] 2.2 L/min/m2  Intake/Output from previous day: 05/15 0701 - 05/16 0700 In: 7403.5 [I.V.:5703.5; Blood:500; IV Piggyback:1200] Out: 4403 [Urine:4755; Blood:1570; Chest Tube:810]  Intake/Output this shift: Total I/O In: 650.5 [P.O.:480; I.V.:120.5; IV Piggyback:50] Out: 65 [Urine:65]  Current Meds: Scheduled Meds: . acetaminophen  1,000 mg Oral 4 times per day   Or  . acetaminophen (TYLENOL) oral liquid 160 mg/5 mL  1,000 mg Per Tube 4 times per day  . aspirin EC  325 mg Oral Daily   Or  . aspirin  324 mg Per Tube Daily  . bisacodyl  10 mg Oral Daily   Or  . bisacodyl  10 mg Rectal Daily  . cefUROXime  (ZINACEF)  IV  1.5 g Intravenous Q12H  . docusate sodium  200 mg Oral Daily  . insulin aspart  0-24 Units Subcutaneous 6 times per day  . metoprolol tartrate  12.5 mg Oral BID   Or  . metoprolol tartrate  12.5 mg Per Tube BID  . [START ON 05/05/2014] pantoprazole  40 mg Oral Daily  . sodium chloride  3 mL Intravenous Q12H   Continuous Infusions: . sodium chloride 20 mL/hr at 05/04/14 0700  . sodium chloride 20 mL/hr at 05/04/14 0700  . sodium chloride Stopped (05/04/14 0100)  . dexmedetomidine Stopped (05/03/14 1415)  . lactated ringers 20 mL/hr at 05/04/14 0700  . nitroGLYCERIN Stopped (05/03/14 1930)  . phenylephrine (NEO-SYNEPHRINE) Adult infusion 0 mcg/min (05/03/14 1230)   PRN Meds:.albumin human, metoprolol, midazolam, morphine injection, ondansetron (ZOFRAN) IV, oxyCODONE, sodium chloride  General appearance: alert, cooperative, appears stated age and no distress Neurologic: intact Heart: regular rate and rhythm, S1, S2 normal, no murmur, click, rub or gallop Lungs: diminished breath sounds bibasilar Abdomen: soft, non-tender; bowel sounds normal; no masses,  no organomegaly Extremities: extremities normal, atraumatic, no cyanosis or edema and Homans sign is negative, no sign of DVT Wound: sternum intact dressing in place  Lab Results: CBC: Recent Labs  05/03/14 1830 05/04/14 0425  WBC 8.7 10.6*  HGB 9.8* 8.9*  HCT 27.5* 25.0*  PLT 77* 73*   BMET:  Recent Labs  05/03/14 1829 05/03/14 1830 05/04/14 0425  NA 143  --  141  K 3.9  --  4.2  CL 107  --  110  CO2  --   --  22  GLUCOSE 118*  --  124*  BUN 14  --  15  CREATININE 1.00 1.09 1.00  CALCIUM  --   --  8.4    PT/INR:  Recent Labs  05/03/14 1300  LABPROT 18.4*  INR 1.58*   Radiology: Dg Chest Portable 1 View In Am  05/04/2014   CLINICAL DATA:  Postoperative evaluation.  EXAM: PORTABLE CHEST - 1 VIEW  COMPARISON:  Yesterday.  FINDINGS: Stable enlarged cardiac silhouette and prosthetic heart valve.  Mildly progressive left upper lobe atelectasis. Stable less than 5% left apical pneumothorax. Interval small left pleural effusion. Increased left lower lobe opacity. Clear right lung. Right jugular Swan-Ganz catheter tip in the main pulmonary artery or proximal right main pulmonary artery. Mediastinal and left chest tubes remain in place.  IMPRESSION: 1. Stable less than 5% left apical pneumothorax. 2. Interval small left pleural effusion. 3. Increased left lung atelectasis. 4. Stable cardiomegaly.   Electronically Signed   By: Travis Armstrong M.D.   On: 05/04/2014 06:48   Dg Chest Portable 1 View  05/03/2014   CLINICAL DATA:  Post CABG  EXAM: PORTABLE CHEST - 1 VIEW  COMPARISON:  On 10/2014  FINDINGS: Endotracheal tube is appropriately positioned. Status post CABG and aortic valvuloplasty. Right IJ approach Swan-Ganz catheter tip is coiled upon itself with tip oriented over the left pulmonary artery. Mediastinal drain and left chest tube in place. The there is mild enlargement of the cardiac silhouette with trace left pleural effusion. Probable trace left apical pneumothorax is identified. No mediastinal shift. Curvilinear left upper lobe atelectasis is present.  IMPRESSION: Postoperative findings as above.  The Swan-Ganz catheter tip appears to be coiled upon itself with tip oriented over the left main pulmonary artery. Consider repositioning depending on accuracy of readings.   Electronically Signed   By: Travis Armstrong M.D.   On: 05/03/2014 13:43     Assessment/Plan: S/P Procedure(s) (LRB): AORTIC VALVE REPLACEMENT (AVR) (N/A) CORONARY ARTERY BYPASS GRAFTING (CABG) (N/A) INTRAOPERATIVE TRANSESOPHAGEAL ECHOCARDIOGRAM (N/A) Mobilize Diuresis See progression orders thrombocytopenia 73000 avoiding heaprin Expected Acute  Blood - loss Anemia   Travis Armstrong 05/04/2014 10:20 AM

## 2014-05-04 NOTE — Progress Notes (Signed)
Patient ID: Travis Armstrong, male   DOB: 08-Dec-1941, 73 y.o.   MRN: [id number] EVENING ROUNDS NOTE :     [address]       [place] [address]             [phone]                 1 Day Post-Op Procedure(s) (LRB): AORTIC VALVE REPLACEMENT (AVR) (N/A) CORONARY ARTERY BYPASS GRAFTING (CABG) (N/A) INTRAOPERATIVE TRANSESOPHAGEAL ECHOCARDIOGRAM (N/A)  Total Length of Stay:  LOS: 1 day  BP 101/64  Pulse 72  Temp(Src) 97.3 F (36.3 C) (Oral)  Resp 17  Ht 5\' 11"  (1.803 m)  Wt 208 lb 15.9 oz (94.8 kg)  BMI 29.16 kg/m2  SpO2 100%  .Intake/Output     05/16 0701 - 05/17 0700   P.O. 1020   I.V. (mL/kg) 320.5 (3.4)   Blood    IV Piggyback 50   Total Intake(mL/kg) 1390.5 (14.7)   Urine (mL/kg/hr) 940 (0.7)   Blood    Chest Tube 380 (0.3)   Total Output 1320   Net +70.5         . sodium chloride 20 mL/hr at 05/04/14 0700  . sodium chloride 20 mL/hr at 05/04/14 0700  . sodium chloride Stopped (05/04/14 0100)  . dexmedetomidine Stopped (05/03/14 1415)  . lactated ringers 20 mL/hr at 05/04/14 0700  . nitroGLYCERIN Stopped (05/03/14 1930)  . phenylephrine (NEO-SYNEPHRINE) Adult infusion 0 mcg/min (05/03/14 1230)     Lab Results  Component Value Date   WBC 13.3* 05/04/2014   HGB 8.7* 05/04/2014   HCT 24.9* 05/04/2014   PLT 72* 05/04/2014   GLUCOSE 159* 05/04/2014   CHOL 85 04/08/2014   TRIG 85.0 04/08/2014   HDL 39.10 04/08/2014   LDLCALC 29 04/08/2014   ALT 24 04/29/2014   AST 33 04/29/2014   NA 139 05/04/2014   K 3.9 05/04/2014   CL 102 05/04/2014   CREATININE 1.03 05/04/2014   BUN 16 05/04/2014   CO2 22 05/04/2014   INR 1.58* 05/03/2014   HGBA1C 5.7* 04/29/2014  stable day Sat in chair, did not walk yet 350 fro ct today, will d/c in am Grace Isaac MD  Beeper [phone] Office [phone] 05/04/2014 8:35 PM

## 2014-05-05 ENCOUNTER — Inpatient Hospital Stay (HOSPITAL_COMMUNITY): Payer: Medicare Other

## 2014-05-05 LAB — GLUCOSE, CAPILLARY
GLUCOSE-CAPILLARY: 102 mg/dL — AB (ref 70–99)
GLUCOSE-CAPILLARY: 125 mg/dL — AB (ref 70–99)
Glucose-Capillary: 125 mg/dL — ABNORMAL HIGH (ref 70–99)
Glucose-Capillary: 113 mg/dL — ABNORMAL HIGH (ref 70–99)
Glucose-Capillary: 135 mg/dL — ABNORMAL HIGH (ref 70–99)
Glucose-Capillary: 116 mg/dL — ABNORMAL HIGH (ref 70–99)

## 2014-05-05 LAB — CBC
HCT: 23.9 % — ABNORMAL LOW (ref 39.0–52.0)
Hemoglobin: 8.3 g/dL — ABNORMAL LOW (ref 13.0–17.0)
MCH: 32.8 pg (ref 26.0–34.0)
MCHC: 34.7 g/dL (ref 30.0–36.0)
MCV: 94.5 fL (ref 78.0–100.0)
Platelets: 71 10*3/uL — ABNORMAL LOW (ref 150–400)
RBC: 2.53 MIL/uL — ABNORMAL LOW (ref 4.22–5.81)
RDW: 14.1 % (ref 11.5–15.5)
WBC: 11.2 10*3/uL — ABNORMAL HIGH (ref 4.0–10.5)

## 2014-05-05 LAB — BASIC METABOLIC PANEL
BUN: 23 mg/dL (ref 6–23)
CO2: 25 mEq/L (ref 19–32)
Calcium: 8.2 mg/dL — ABNORMAL LOW (ref 8.4–10.5)
Chloride: 103 mEq/L (ref 96–112)
Creatinine, Ser: 1.15 mg/dL (ref 0.50–1.35)
GFR calc Af Amer: 71 mL/min — ABNORMAL LOW (ref >90)
GFR calc non Af Amer: 61 mL/min — ABNORMAL LOW (ref >90)
Glucose, Bld: 110 mg/dL — ABNORMAL HIGH (ref 70–99)
Potassium: 4.1 mEq/L (ref 3.7–5.3)
Sodium: 135 mEq/L — ABNORMAL LOW (ref 137–147)

## 2014-05-05 MED ORDER — AMIODARONE HCL 200 MG PO TABS: 200.0000 mg | ORAL_TABLET | Freq: Every day | ORAL | Status: DC

## 2014-05-05 MED ORDER — AMIODARONE HCL 200 MG PO TABS: 200.0000 mg | ORAL_TABLET | Freq: Two times a day (BID) | ORAL | Status: DC

## 2014-05-05 MED ORDER — FUROSEMIDE 10 MG/ML IJ SOLN
40.0000 mg | Freq: Once | INTRAMUSCULAR | Status: AC
  Administered 2014-05-05: 40 mg via INTRAVENOUS

## 2014-05-05 MED ORDER — AMIODARONE HCL IN DEXTROSE 360-4.14 MG/200ML-% IV SOLN: 60.0000 mg/h | INTRAVENOUS | Status: DC

## 2014-05-05 MED ORDER — AMIODARONE LOAD VIA INFUSION: 150.0000 mg | Freq: Once | INTRAVENOUS | Status: DC

## 2014-05-05 MED ORDER — AMIODARONE HCL IN DEXTROSE 360-4.14 MG/200ML-% IV SOLN: 30.0000 mg/h | INTRAVENOUS | Status: DC

## 2014-05-05 NOTE — Progress Notes (Signed)
Patient ID: Elad Macphail Newhall, male   DOB: 02-07-1941, 74 y.o.   MRN: [id number] EVENING ROUNDS NOTE :     [address]       [place] [address]             [phone]                 2 Days Post-Op Procedure(s) (LRB): AORTIC VALVE REPLACEMENT (AVR) (N/A) CORONARY ARTERY BYPASS GRAFTING (CABG) (N/A) INTRAOPERATIVE TRANSESOPHAGEAL ECHOCARDIOGRAM (N/A)  Total Length of Stay:  LOS: 2 days  BP 131/59  Pulse 74  Temp(Src) 98.2 F (36.8 C) (Oral)  Resp 13  Ht 5\' 11"  (1.803 m)  Wt 210 lb 5.1 oz (95.4 kg)  BMI 29.35 kg/m2  SpO2 98%  .Intake/Output     05/17 0701 - 05/18 0700   P.O. 1020   I.V. (mL/kg) 220 (2.3)   IV Piggyback 50   Total Intake(mL/kg) 1290 (13.5)   Urine (mL/kg/hr) 1500 (1.2)   Chest Tube 50 (0)   Total Output 1550   Net -260         . sodium chloride 20 mL/hr at 05/04/14 0700  . sodium chloride 20 mL/hr at 05/04/14 0700  . sodium chloride Stopped (05/04/14 0100)  . dexmedetomidine Stopped (05/03/14 1415)  . lactated ringers 20 mL/hr at 05/04/14 0700  . nitroGLYCERIN Stopped (05/03/14 1930)  . phenylephrine (NEO-SYNEPHRINE) Adult infusion 0 mcg/min (05/03/14 1230)     Lab Results  Component Value Date   WBC 11.2* 05/05/2014   HGB 8.3* 05/05/2014   HCT 23.9* 05/05/2014   PLT 71* 05/05/2014   GLUCOSE 110* 05/05/2014   CHOL 85 04/08/2014   TRIG 85.0 04/08/2014   HDL 39.10 04/08/2014   LDLCALC 29 04/08/2014   ALT 24 04/29/2014   AST 33 04/29/2014   NA 135* 05/05/2014   K 4.1 05/05/2014   CL 103 05/05/2014   CREATININE 1.15 05/05/2014   BUN 23 05/05/2014   CO2 25 05/05/2014   INR 1.58* 05/03/2014   HGBA1C 5.7* 04/29/2014   Up to chair , tubes all out Stable day  Grace Isaac MD  Beeper [phone] Office [phone] 05/05/2014 7:44 PM

## 2014-05-05 NOTE — Progress Notes (Signed)
Pt ambulated in hallway 400 feet with steady gait while pushing wheelchair. Tolerated well.  Vella Raring, RN

## 2014-05-05 NOTE — Progress Notes (Signed)
Patient ID: Travis Armstrong, male   DOB: June 20, 1941, 73 y.o.   MRN: [id number] T[facility] DAILY ICU PROGRESS NOTE                   [address]            [place] [address]          [phone]   2 Days Post-Op Procedure(s) (LRB): AORTIC VALVE REPLACEMENT (AVR) (N/A) CORONARY ARTERY BYPASS GRAFTING (CABG) (N/A) INTRAOPERATIVE TRANSESOPHAGEAL ECHOCARDIOGRAM (N/A)  Total Length of Stay:  LOS: 2 days   Subjective: Up to chair, neuro intact  Objective: Vital signs in last 24 hours: Temp:  [97.1 F (36.2 C)-98.2 F (36.8 C)] 97.5 F (36.4 C) (05/17 0715) Pulse Rate:  [48-76] 66 (05/17 0900) Cardiac Rhythm:  [-] Normal sinus rhythm (05/17 0744) Resp:  [7-21] 17 (05/17 0900) BP: (96-126)/(42-64) 117/53 mmHg (05/17 0900) SpO2:  [94 %-100 %] 99 % (05/17 0900) Arterial Line BP: (129)/(61) 129/61 mmHg (05/16 1000) Weight:  [210 lb 5.1 oz (95.4 kg)] 210 lb 5.1 oz (95.4 kg) (05/17 0600)  Filed Weights   05/03/14 0549 05/04/14 0300 05/05/14 0600  Weight: 203 lb (92.08 kg) 208 lb 15.9 oz (94.8 kg) 210 lb 5.1 oz (95.4 kg)    Weight change: 1 lb 5.2 oz (0.6 kg)   Hemodynamic parameters for last 24 hours: PAP: (42)/(29) 42/29 mmHg  Intake/Output from previous day: 05/16 0701 - 05/17 0700 In: 1660.5 [P.O.:1020; I.V.:540.5; IV Piggyback:100] Out: 2130 [Urine:1340; Chest Tube:470]  Intake/Output this shift: Total I/O In: 370 [P.O.:300; I.V.:20; IV Piggyback:50] Out: 100 [Urine:50; Chest Tube:50]  Current Meds: Scheduled Meds: . acetaminophen  1,000 mg Oral 4 times per day   Or  . acetaminophen (TYLENOL) oral liquid 160 mg/5 mL  1,000 mg Per Tube 4 times per day  . aspirin EC  325 mg Oral Daily   Or  . aspirin  324 mg Per Tube Daily  . bisacodyl  10 mg Oral Daily   Or  . bisacodyl  10 mg Rectal Daily  . docusate sodium  200 mg Oral Daily  . insulin aspart  0-24 Units Subcutaneous 6 times per day  . metoprolol tartrate  12.5 mg Oral BID   Or  . metoprolol tartrate   12.5 mg Per Tube BID  . pantoprazole  40 mg Oral Daily  . sodium chloride  3 mL Intravenous Q12H   Continuous Infusions: . sodium chloride 20 mL/hr at 05/04/14 0700  . sodium chloride 20 mL/hr at 05/04/14 0700  . sodium chloride Stopped (05/04/14 0100)  . dexmedetomidine Stopped (05/03/14 1415)  . lactated ringers 20 mL/hr at 05/04/14 0700  . nitroGLYCERIN Stopped (05/03/14 1930)  . phenylephrine (NEO-SYNEPHRINE) Adult infusion 0 mcg/min (05/03/14 1230)   PRN Meds:.metoprolol, midazolam, morphine injection, ondansetron (ZOFRAN) IV, oxyCODONE, sodium chloride  General appearance: alert, cooperative, appears stated age and no distress Neurologic: intact Heart: regular rate and rhythm, S1, S2 normal, no murmur, click, rub or gallop Lungs: diminished breath sounds LLL Abdomen: soft, non-tender; bowel sounds normal; no masses,  no organomegaly Extremities: extremities normal, atraumatic, no cyanosis or edema and Homans sign is negative, no sign of DVT Wound: sternum stable  Lab Results: CBC: Recent Labs  05/04/14 1540 05/05/14 0340  WBC 13.3* 11.2*  HGB 8.7* 8.3*  HCT 24.9* 23.9*  PLT 72* 71*   BMET:  Recent Labs  05/04/14 0425 05/04/14 1531 05/04/14 1540 05/05/14 0340  NA 141 139  --  135*  K 4.2 3.9  --  4.1  CL 110 102  --  103  CO2 22  --   --  25  GLUCOSE 124* 159*  --  110*  BUN 15 16  --  23  CREATININE 1.00 1.10 1.03 1.15  CALCIUM 8.4  --   --  8.2*    PT/INR:  Recent Labs  05/03/14 1300  LABPROT 18.4*  INR 1.58*   Radiology: Dg Chest Port 1 View  05/05/2014   CLINICAL DATA:  Status post cardiac surgery.  EXAM: PORTABLE CHEST - 1 VIEW  COMPARISON:  Yesterday.  FINDINGS: A less than 5% left apical pneumothorax is again demonstrated. Stable left lung atelectasis and clear right lung. Stable enlarged cardiac silhouette and prosthetic heart valve. The right jugular Swan-Ganz catheter has been removed and the sheath remains in place. Stable mediastinal and left  chest tubes. Thoracic spine degenerative changes.  IMPRESSION: 1. Stable less than 5% left apical pneumothorax. 2. Stable left lung atelectasis and cardiomegaly.   Electronically Signed   By: Enrique Sack M.D.   On: 05/05/2014 06:06   Dg Chest Portable 1 View In Am  05/04/2014   CLINICAL DATA:  Postoperative evaluation.  EXAM: PORTABLE CHEST - 1 VIEW  COMPARISON:  Yesterday.  FINDINGS: Stable enlarged cardiac silhouette and prosthetic heart valve. Mildly progressive left upper lobe atelectasis. Stable less than 5% left apical pneumothorax. Interval small left pleural effusion. Increased left lower lobe opacity. Clear right lung. Right jugular Swan-Ganz catheter tip in the main pulmonary artery or proximal right main pulmonary artery. Mediastinal and left chest tubes remain in place.  IMPRESSION: 1. Stable less than 5% left apical pneumothorax. 2. Interval small left pleural effusion. 3. Increased left lung atelectasis. 4. Stable cardiomegaly.   Electronically Signed   By: Enrique Sack M.D.   On: 05/04/2014 06:48   Dg Chest Portable 1 View  05/03/2014   CLINICAL DATA:  Post CABG  EXAM: PORTABLE CHEST - 1 VIEW  COMPARISON:  On 10/2014  FINDINGS: Endotracheal tube is appropriately positioned. Status post CABG and aortic valvuloplasty. Right IJ approach Swan-Ganz catheter tip is coiled upon itself with tip oriented over the left pulmonary artery. Mediastinal drain and left chest tube in place. The there is mild enlargement of the cardiac silhouette with trace left pleural effusion. Probable trace left apical pneumothorax is identified. No mediastinal shift. Curvilinear left upper lobe atelectasis is present.  IMPRESSION: Postoperative findings as above.  The Swan-Ganz catheter tip appears to be coiled upon itself with tip oriented over the left main pulmonary artery. Consider repositioning depending on accuracy of readings.   Electronically Signed   By: Conchita Paris M.D.   On: 05/03/2014 13:43      Assessment/Plan: S/P Procedure(s) (LRB): AORTIC VALVE REPLACEMENT (AVR) (N/A) CORONARY ARTERY BYPASS GRAFTING (CABG) (N/A) INTRAOPERATIVE TRANSESOPHAGEAL ECHOCARDIOGRAM (N/A) Mobilize Diuresis d/c tubes/lines ambulate thrombocytopenia avoid heparin     Travis Armstrong 05/05/2014 9:54 AM

## 2014-05-06 ENCOUNTER — Inpatient Hospital Stay (HOSPITAL_COMMUNITY): Payer: Medicare Other

## 2014-05-06 LAB — CBC
HCT: 23.5 % — ABNORMAL LOW (ref 39.0–52.0)
Hemoglobin: 8.1 g/dL — ABNORMAL LOW (ref 13.0–17.0)
MCH: 32.5 pg (ref 26.0–34.0)
MCHC: 34.5 g/dL (ref 30.0–36.0)
MCV: 94.4 fL (ref 78.0–100.0)
Platelets: 71 10*3/uL — ABNORMAL LOW (ref 150–400)
RBC: 2.49 MIL/uL — ABNORMAL LOW (ref 4.22–5.81)
RDW: 13.6 % (ref 11.5–15.5)
WBC: 7.9 10*3/uL (ref 4.0–10.5)

## 2014-05-06 LAB — BASIC METABOLIC PANEL
BUN: 23 mg/dL (ref 6–23)
CO2: 26 mEq/L (ref 19–32)
Calcium: 8.4 mg/dL (ref 8.4–10.5)
Chloride: 97 mEq/L (ref 96–112)
Creatinine, Ser: 0.99 mg/dL (ref 0.50–1.35)
GFR calc Af Amer: >90 mL/min (ref >90)
GFR calc non Af Amer: 79 mL/min — ABNORMAL LOW (ref >90)
Glucose, Bld: 113 mg/dL — ABNORMAL HIGH (ref 70–99)
Potassium: 3.9 mEq/L (ref 3.7–5.3)
Sodium: 132 mEq/L — ABNORMAL LOW (ref 137–147)

## 2014-05-06 LAB — GLUCOSE, CAPILLARY
GLUCOSE-CAPILLARY: 122 mg/dL — AB (ref 70–99)
Glucose-Capillary: 110 mg/dL — ABNORMAL HIGH (ref 70–99)
Glucose-Capillary: 93 mg/dL (ref 70–99)

## 2014-05-06 MED ORDER — SODIUM CHLORIDE 0.9 % IJ SOLN
3.0000 mL | Freq: Two times a day (BID) | INTRAMUSCULAR | Status: DC
  Administered 2014-05-06 – 2014-05-07 (×4): 3 mL via INTRAVENOUS

## 2014-05-06 MED ORDER — MOVING RIGHT ALONG BOOK
Freq: Once | Status: AC
  Administered 2014-05-06: 10:00:00
  Filled 2014-05-06: qty 1

## 2014-05-06 MED ORDER — FUROSEMIDE 40 MG PO TABS
40.0000 mg | ORAL_TABLET | Freq: Every day | ORAL | Status: DC
  Administered 2014-05-06 – 2014-05-07 (×2): 40 mg via ORAL
  Filled 2014-05-06 (×3): qty 1

## 2014-05-06 MED ORDER — ATORVASTATIN CALCIUM 40 MG PO TABS
40.0000 mg | ORAL_TABLET | Freq: Every day | ORAL | Status: DC
  Administered 2014-05-06 – 2014-05-07 (×2): 40 mg via ORAL
  Filled 2014-05-06 (×3): qty 1

## 2014-05-06 MED ORDER — ASPIRIN EC 81 MG PO TBEC
81.0000 mg | DELAYED_RELEASE_TABLET | Freq: Every day | ORAL | Status: DC
  Administered 2014-05-06 – 2014-05-07 (×2): 81 mg via ORAL
  Filled 2014-05-06 (×3): qty 1

## 2014-05-06 MED ORDER — METOPROLOL TARTRATE 12.5 MG HALF TABLET
12.5000 mg | ORAL_TABLET | Freq: Two times a day (BID) | ORAL | Status: DC
  Administered 2014-05-06 – 2014-05-07 (×4): 12.5 mg via ORAL
  Filled 2014-05-06 (×6): qty 1

## 2014-05-06 MED ORDER — SODIUM CHLORIDE 0.9 % IV SOLN: 250.0000 mL | INTRAVENOUS | Status: DC | PRN

## 2014-05-06 MED ORDER — CLOPIDOGREL BISULFATE 75 MG PO TABS: 75.0000 mg | ORAL_TABLET | Freq: Every day | ORAL | Status: DC

## 2014-05-06 MED ORDER — POTASSIUM CHLORIDE 10 MEQ/50ML IV SOLN
10.0000 meq | INTRAVENOUS | Status: AC
  Administered 2014-05-06 (×2): 10 meq via INTRAVENOUS
  Filled 2014-05-06 (×2): qty 50

## 2014-05-06 MED ORDER — LISINOPRIL 10 MG PO TABS
10.0000 mg | ORAL_TABLET | Freq: Every day | ORAL | Status: DC
  Administered 2014-05-06 – 2014-05-07 (×2): 10 mg via ORAL
  Filled 2014-05-06 (×3): qty 1

## 2014-05-06 MED ORDER — TRAMADOL HCL 50 MG PO TABS
50.0000 mg | ORAL_TABLET | ORAL | Status: DC | PRN
  Administered 2014-05-06: 100 mg via ORAL
  Filled 2014-05-06: qty 2

## 2014-05-06 MED ORDER — POTASSIUM CHLORIDE CRYS ER 20 MEQ PO TBCR
20.0000 meq | EXTENDED_RELEASE_TABLET | Freq: Every day | ORAL | Status: DC
  Administered 2014-05-07: 20 meq via ORAL
  Filled 2014-05-06 (×2): qty 1

## 2014-05-06 MED ORDER — SODIUM CHLORIDE 0.9 % IJ SOLN: 3.0000 mL | INTRAMUSCULAR | Status: DC | PRN

## 2014-05-06 MED FILL — Electrolyte-R (PH 7.4) Solution: INTRAVENOUS | Qty: 3000 | Status: AC

## 2014-05-06 MED FILL — Mannitol IV Soln 20%: INTRAVENOUS | Qty: 500 | Status: AC

## 2014-05-06 MED FILL — Lidocaine HCl IV Inj 20 MG/ML: INTRAVENOUS | Qty: 5 | Status: AC

## 2014-05-06 MED FILL — Sodium Bicarbonate IV Soln 8.4%: INTRAVENOUS | Qty: 50 | Status: AC

## 2014-05-06 MED FILL — Heparin Sodium (Porcine) Inj 1000 Unit/ML: INTRAMUSCULAR | Qty: 30 | Status: AC

## 2014-05-06 MED FILL — Sodium Chloride IV Soln 0.9%: INTRAVENOUS | Qty: 2000 | Status: AC

## 2014-05-06 MED FILL — Potassium Chloride Inj 2 mEq/ML: INTRAVENOUS | Qty: 40 | Status: AC

## 2014-05-06 MED FILL — Magnesium Sulfate Inj 50%: INTRAMUSCULAR | Qty: 10 | Status: AC

## 2014-05-06 MED FILL — Heparin Sodium (Porcine) Inj 1000 Unit/ML: INTRAMUSCULAR | Qty: 10 | Status: AC

## 2014-05-06 NOTE — Progress Notes (Signed)
External epicardial pacing wires removed without complication. Wires intact; no tissue noted on tips; no bleeding from sites observed.

## 2014-05-06 NOTE — Progress Notes (Signed)
CT surgery p.m. Rounds  Patient had a stable day He is waiting for transfer to step down bed available Maintaining sinus rhythm, adequate diuresis

## 2014-05-06 NOTE — Progress Notes (Signed)
      [address]       [place] [address]             [phone]        CARDIOTHORACIC SURGERY PROGRESS NOTE   R3 Days Post-Op Procedure(s) (LRB): AORTIC VALVE REPLACEMENT (AVR) (N/A) CORONARY ARTERY BYPASS GRAFTING (CABG) (N/A) INTRAOPERATIVE TRANSESOPHAGEAL ECHOCARDIOGRAM (N/A)  Subjective: Feels well.  Minimal soreness.  No SOB.  Good appetite.  Objective: Vital signs: BP Readings from Last 1 Encounters:  05/06/14 129/58   Pulse Readings from Last 1 Encounters:  05/06/14 67   Resp Readings from Last 1 Encounters:  05/06/14 15   Temp Readings from Last 1 Encounters:  05/06/14 97.8 F (36.6 C) Oral    Hemodynamics:    Physical Exam:  Rhythm:   sinus  Breath sounds: clear  Heart sounds:  RRR  Incisions:  Dressing dry, intact  Abdomen:  Soft, non-distended, non-tender  Extremities:  Warm, well-perfused    Intake/Output from previous day: 05/17 0701 - 05/18 0700 In: 1670 [P.O.:1260; I.V.:360; IV Piggyback:50] Out: 2400 [Urine:2350; Chest Tube:50] Intake/Output this shift:    Lab Results:  CBC: Recent Labs  05/05/14 0340 05/06/14 0430  WBC 11.2* 7.9  HGB 8.3* 8.1*  HCT 23.9* 23.5*  PLT 71* 71*    BMET:  Recent Labs  05/05/14 0340 05/06/14 0430  NA 135* 132*  K 4.1 3.9  CL 103 97  CO2 25 26  GLUCOSE 110* 113*  BUN 23 23  CREATININE 1.15 0.99  CALCIUM 8.2* 8.4     CBG (last 3)   Recent Labs  05/06/14 0036 05/06/14 0419 05/06/14 0728  GLUCAP 122* 110* 93    ABG    Component Value Date/Time   PHART 7.381 05/04/2014 0410   PCO2ART 37.7 05/04/2014 0410   PO2ART 124.0* 05/04/2014 0410   HCO3 21.8 05/04/2014 0410   TCO2 23 05/04/2014 1531   ACIDBASEDEF 2.5* 05/04/2014 0410   O2SAT 99.7 05/04/2014 0410    CXR: PORTABLE CHEST - 1 VIEW  COMPARISON: May 05, 2014  FINDINGS:  Central catheter tip is in the superior vena cava. There is no  pneumothorax appear on the right. Small apical pneumothorax on the  left is  not appreciably changed.  There is patchy atelectasis in the left upper and lower lobes,  stable. The right lung is clear. Cardiomegaly is stable. The  pulmonary vascularity is within normal limits. There is evidence of  median sternotomy.  IMPRESSION:  Persistent small left apical pneumothorax change. Patchy atelectasis  throughout the left lung. Right lung clear. Stable cardiomegaly.  Electronically Signed  By: Lowella Grip M.D.  On: 05/06/2014 07:34   Assessment/Plan: S/P Procedure(s) (LRB): AORTIC VALVE REPLACEMENT (AVR) (N/A) CORONARY ARTERY BYPASS GRAFTING (CABG) (N/A) INTRAOPERATIVE TRANSESOPHAGEAL ECHOCARDIOGRAM (N/A)  Doing well POD3 Expected post op acute blood loss anemia, stable Expected post op volume excess, mild, diuresing Expected post op atelectasis, mild Post op thrombocytopenia, stable   Mobilize  Diuresis  Pulm toilet  Watch platelet count and hold Plavix for now  Transfer floor  Travis Armstrong 05/06/2014 8:25 AM

## 2014-05-06 NOTE — Progress Notes (Signed)
Pt ambulated in hallway 300 feet. Tolerated well.  Vella Raring, RN

## 2014-05-07 ENCOUNTER — Inpatient Hospital Stay (HOSPITAL_COMMUNITY): Payer: Medicare Other

## 2014-05-07 ENCOUNTER — Encounter (HOSPITAL_COMMUNITY): Payer: Self-pay | Admitting: Thoracic Surgery (Cardiothoracic Vascular Surgery)

## 2014-05-07 LAB — CBC
HEMATOCRIT: 23.8 % — AB (ref 39.0–52.0)
HEMOGLOBIN: 8.3 g/dL — AB (ref 13.0–17.0)
MCH: 32.5 pg (ref 26.0–34.0)
MCHC: 34.9 g/dL (ref 30.0–36.0)
MCV: 93.3 fL (ref 78.0–100.0)
Platelets: 110 10*3/uL — ABNORMAL LOW (ref 150–400)
RBC: 2.55 MIL/uL — ABNORMAL LOW (ref 4.22–5.81)
RDW: 13.5 % (ref 11.5–15.5)
WBC: 6.9 10*3/uL (ref 4.0–10.5)

## 2014-05-07 LAB — BASIC METABOLIC PANEL
BUN: 22 mg/dL (ref 6–23)
CHLORIDE: 104 meq/L (ref 96–112)
CO2: 27 mEq/L (ref 19–32)
Calcium: 8.7 mg/dL (ref 8.4–10.5)
Creatinine, Ser: 0.97 mg/dL (ref 0.50–1.35)
GFR calc Af Amer: >90 mL/min (ref >90)
GFR calc non Af Amer: 80 mL/min — ABNORMAL LOW (ref >90)
GLUCOSE: 124 mg/dL — AB (ref 70–99)
Potassium: 4.1 mEq/L (ref 3.7–5.3)
SODIUM: 140 meq/L (ref 137–147)

## 2014-05-07 NOTE — Progress Notes (Signed)
CARDIAC REHAB PHASE I   PRE:  Rate/Rhythm: 65 SR  BP:  Supine:   Sitting: 135/76  Standing:    SaO2: 93 RA  MODE:  Ambulation: 550 ft   POST:  Rate/Rhythm: 76  BP:  Supine:   Sitting: 138/71  Standing:    SaO2: 98 RA 1105-1205 Assisted X 1 with hand held assist to ambulate. Gait steady. Pt able to walk 550 feet without c/o. Pt to recliner after walk with call light in reach and wife present. We discussed Outpt. CRP. Pt agrees to referral, will send to [facility].  Rodney Langton RN 05/07/2014 12:03 PM

## 2014-05-07 NOTE — Discharge Instructions (Signed)
Coronary Artery Bypass Grafting, Care After °Refer to this sheet in the next few weeks. These instructions provide you with information on caring for yourself after your procedure. Your health care provider may also give you more specific instructions. Your treatment has been planned according to current medical practices, but problems sometimes occur. Call your health care provider if you have any problems or questions after your procedure. °WHAT TO EXPECT AFTER THE PROCEDURE °Recovery from surgery will be different for everyone. Some people feel well after 3 or 4 weeks, while for others it takes longer. After your procedure, it is typical to have the following: °· Nausea and a lack of appetite.   °· Constipation. °· Weakness and fatigue.   °· Depression or irritability.   °· Pain or discomfort at your incision site. °HOME CARE INSTRUCTIONS °· Only take over-the-counter or prescription medicines as directed by your health care provider. Take all medicines exactly as directed. Do not stop taking medicines or start any new medicines without first checking with your health care provider.   °· Take your pulse as directed by your health care provider. °· Perform deep breathing as directed by your health care provider. If you were given a device called an incentive spirometer, use it to practice deep breathing several times a day. Support your chest with a pillow or your arms when you take deep breaths or cough. °· Keep incision areas clean, dry, and protected. Remove or change any bandages (dressings) only as directed by your health care provider. You may have skin adhesive strips over the incision areas. Do not take the strips off. They will fall off on their own. °· Check incision areas daily for any swelling, redness, or drainage. °· If incisions were made in your legs, do the following: °· Avoid crossing your legs.   °· Avoid sitting for long periods of time. Change positions every 30 minutes.   °· Elevate your legs  when you are sitting.   °· Wear compression stockings as directed by your health care provider. These stockings help keep blood clots from forming in your legs. °· Take showers once your health care provider approves. Until then, only take sponge baths. Pat incisions dry. Do not rub incisions with a washcloth or towel. Do not take tub baths or go swimming until your health care provider approves. °· Eat foods that are high in fiber, such as raw fruits and vegetables, whole grains, beans, and nuts. Meats should be lean cut. Avoid canned, processed, and fried foods. °· Drink enough fluids to keep your urine clear or pale yellow. °· Weigh yourself every day. This helps identify if you are retaining fluid that may make your heart and lungs work harder.   °· Rest and limit activity as directed by your health care provider. You may be instructed to: °· Stop any activity at once if you have chest pain, shortness of breath, irregular heartbeats, or dizziness. Get help right away if you have any of these symptoms. °· Move around frequently for short periods or take short walks as directed by your health care provider. Increase your activities gradually. You may need physical therapy or cardiac rehabilitation to help strengthen your muscles and build your endurance. °· Avoid lifting, pushing, or pulling anything heavier than 10 lb (4.5 kg) for at least 6 weeks after surgery. °· Do not drive until your health care provider approves.  °· Ask your health care provider when you may return to work and resume sexual activity. °· Follow up with your health care provider as   directed.  SEEK MEDICAL CARE IF:  You have swelling, redness, increasing pain, or drainage at the site of an incision.   You develop a fever.   You have swelling in your ankles or legs.   You have pain in your legs.   You have weight gain of 2 or more pounds a day.  You are nauseous or vomit.  You have diarrhea. SEEK IMMEDIATE MEDICAL CARE  IF:  You have chest pain that goes to your jaw or arms.  You have shortness of breath.   You have a fast or irregular heartbeat.   You notice a "clicking" in your breastbone (sternum) when you move.   You have numbness or weakness in your arms or legs.  You feel dizzy or lightheaded.  MAKE SURE YOU:  Understand these instructions.  Will watch your condition.  Will get help right away if you are not doing well or get worse. Document Released: 06/25/2005 Document Revised: 08/08/2013 Document Reviewed: 05/15/2013 [facility] Patient Information 2014 [place]. Aortic Valve Replacement Care After Refer to this sheet in the next few weeks. These instructions provide you with information on caring for yourself after your procedure. Your caregiver may also give you specific instructions. Your treatment has been planned according to current medical practices, but problems sometimes occur. Call your caregiver if you have any problems or questions after your procedure. HOME CARE INSTRUCTIONS   Only take over-the-counter or prescription medicines as directed by your caregiver.  Take your temperature every morning for the first 7 days after surgery. Write these down. Call your caregiver if your temperature stays above 100 F (37.8 C) for more than a day.   Weigh yourself every morning for at least 7 days after surgery. Write your weight down to monitor any weight increase.  Wear elastic stockings during the day for at least 2 weeks after surgery. Use them longer if your ankles are swollen. The stockings help blood flow and help reduce swelling in the legs.  Take frequent naps or rest often throughout the day.  Avoid lifting over 10 lbs (4.5 kg) or pushing or pulling things with your arms for 6 8 weeks or as directed by your caregiver.  Avoid driving or airplane travel for 4 6 weeks after surgery or as directed. If you are riding in a car for an extended period, stop every 1 2  hours to stretch your legs. Keep a record of your medicines and medical history with you when traveling.  Do not cross your legs.  Do not take baths for 4 6 weeks after surgery. Take showers once your caregiver approves. Pat incisions dry. Do not rub incisions with a washcloth or towel.  Avoid climbing stairs and using the handrail to pull yourself up for the first 2 3 weeks after surgery.  Return to work as directed by your caregiver.  Drink enough fluids to keep your urine clear or pale yellow.  Do not strain to have a bowel movement. Eat high-fiber foods if you become constipated. You may also take a medicine to help you have a bowel movement (laxative) as directed by your caregiver.  Resume sexual activity as directed by your caregiver. Men should not use medicines for erectile dysfunction until their doctor says it isokay.  If you had a certain type of heart condition in the past, you may need to take antibiotic medicine before having dental work or surgery. Let your dentist and caregivers know if you had one or more of  the following:  Previous endocarditis.  An artificial (prosthetic) heart valve.  Congenital heart disease. SEEK MEDICAL CARE IF:  You develop a skin rash.   Your weight is increasing each day over 2 3 days, or you have a sudden weight gain.  Your weight increases by 2 or more pounds (1 kg or more) in a single day. SEEK IMMEDIATE MEDICAL CARE IF:   You develop chest pain that is not coming from your incision.   You develop shortness of breath or have difficulty breathing.   You have a fever.   You have increased bleeding from your wounds.   You have increasing wound pain.   You have redness or swelling around your wounds  You have pus coming from your wound.   You develop lightheadedness.  MAKE SURE YOU:   Understand these directions.  Will watch your condition.  Will get help right away if you are not doing well or get  worse. Document Released: 06/24/2005 Document Revised: 11/22/2012 Document Reviewed: 09/19/2012 [facility] Patient Information 2014 [place], [place].

## 2014-05-07 NOTE — Care Management Note (Unsigned)
    Page 1 of 1   05/07/2014     4:55:35 PM CARE MANAGEMENT NOTE 05/07/2014  Patient:  Travis Armstrong, Travis Armstrong   Account Number:  [id number]  Date Initiated:  05/07/2014  Documentation initiated by:  Marci Polito  Subjective/Objective Assessment:   Pt s/p CABG/AVR on 05/03/14.  PTA, pt independent, lives with family.     Action/Plan:   Will follow for dc needs as pt progresses.  Family to provide care at dc.   Anticipated DC Date:  05/08/2014   Anticipated DC Plan:  [place]  CM consult      Choice offered to / List presented to:             Status of service:  In process, will continue to follow Medicare Important Message given?   (If response is "NO", the following Medicare IM given date fields will be blank) Date Medicare IM given:   Date Additional Medicare IM given:    Discharge Disposition:    Per UR Regulation:  Reviewed for med. necessity/level of care/duration of stay  If discussed at [place] of Stay Meetings, dates discussed:    Comments:

## 2014-05-07 NOTE — Discharge Summary (Signed)
Physician Discharge Summary  Patient ID: Travis Armstrong MRN: [id number] DOB/AGE: [birth year] 73 y.o.  Admit date: 05/03/2014 Discharge date: 05/07/2014  Admission Diagnoses: Severe aortic stenosis Severe single-vessel coronary artery disease  Discharge Diagnoses:  Principal Problem:   S/P aortic valve replacement with bioprosthetic valve and CABG x Active Problems:   Aortic valve disorders   HYPERLIPIDEMIA-MIXED   Coronary artery disease   Anginal pain   Aortic stenosis   CAD (coronary artery disease)   Bicuspid aortic valve   S/P CABG x 1  Medications at discharge:   Discharged Condition: good  History of present illness : HPI:  Patient is a 73 year old retired white male from [place] referred for possible surgical treatment of bicuspid aortic valve with aortic stenosis and severe single-vessel coronary artery disease. The patient's cardiac history dates back to 20012 when he presented with symptoms of exertional angina. He was noted to have a heart murmur on physical exam but echocardiogram revealed a bicuspid aortic valve but only mild aortic stenosis. MRA of the chest was notable for the absence of significant aneurysmal enlargement of the ascending thoracic aorta. The patient underwent cardiac catheterization and was found to have high-grade stenosis of the left anterior descending coronary artery. He was treated with PCI and stenting using a drug-eluting stent. He did well for a period of time, but he develop recurrent angina in October of 2013. Followup catheterization revealed restenosis in the proximal left anterior descending coronary artery. He underwent repeat PCI and stenting with another drug-eluting stent. The patient has continued to follow closely with Dr. Aundra Dubin, and over the past 2 years he has demonstrated progression of severity of aortic stenosis. Followup transthoracic and transesophageal echocardiogram performed in January of this year demonstrates what appears to  be bicuspid native aortic valve with at least moderate aortic stenosis with preserved LV systolic function. Over the past 2 months the patient has developed recurrent symptoms of exertional chest tightness consistent with angina pectoris. He has also experienced increasing fatigue, but he continues to deny symptoms of exertional shortness of breath. He develops chest tightness with walking or strenuous exertion, but symptoms are typically resolve quickly by rest. He denies any history PND, orthopnea, lower extremity edema, tachypalpitations, dizzy spells, or syncope. He subsequently underwent left and right heart catheterization last week. This revealed restenosis of the mid left anterior descending coronary artery within the previous stents. There has not been any progression of other significant coronary artery disease. The patient was referred for elective surgical consultation. Since he underwent catheterization he has had some accelerations symptoms of angina pectoris, but all episodes of chest pain and been transient and relieved by rest. He has not had to take any sublingual nitroglycerin.  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 He lives in [place] with his wife. He remains active physically and enjoys playing golf as well as working in their yard. He has some arthritis involving his lower back and right hip, but this does not limit his mobility to much. He has no other significant physical limitations. Dr. Roxy Manns evaluated the patient and his studies and recommended aortic valve replacement and coronary artery bypass grafting. He was admitted this hospitalization for the procedure.   Past Medical History   Diagnosis  Date   .  Keratosis, actinic    .  Muscle strain    .  HTN (hypertension)    .  CAD (coronary artery disease)    .  Hyperlipidemia    .  Pneumonia  1958   .  Bradycardia, sinus    .  Aortic stenosis    .  Bicuspid aortic valve     Past Surgical History    Procedure  Laterality  Date   .  Coronary angioplasty with stent placement   11/23/2011     DES mid LAD   .  Coronary angioplasty with stent placement   10/02/2012     DES prox LAD   .  Inguinal hernia repair   ~ 2007     left   .  Tee without cardioversion  N/A  01/15/2014     Procedure: TRANSESOPHAGEAL ECHOCARDIOGRAM (TEE); Surgeon: Larey Dresser, MD; Location: [facility] ENDOSCOPY; Service: Cardiovascular; Laterality: N/A;    Family History   Problem  Relation  Age of Onset   .  Coronary artery disease  Brother    .  Colon cancer  Neg Hx    .  Esophageal cancer  Neg Hx    .  Stomach cancer  Neg Hx    .  Rectal cancer  Neg Hx     History    Social History   .  Marital Status:  Married     Spouse Name:  N/A     Number of Children:  N/A   .  Years of Education:  N/A    Occupational History   .  Not on file.    Social History Main Topics    .  Smoking status:  Former Smoker -- 1.00 packs/day for 8 years     Types:  Cigarettes     Quit date:  03/28/1972   .  Smokeless tobacco:  Never Used   .  Alcohol Use:  No      Comment: "last alcohol was ~ 1990's"   .  Drug Use:  No   .  Sexual Activity:  Not Currently      Comment: "stopped smoking cigarettes 1973"    Other Topics  Concern    .  Not on file    Social History Narrative    Lives in [place] with his wife. Retired.    Current Outpatient Prescriptions   Medication  Sig  Dispense  Refill   .  aspirin 81 MG tablet  Take 81 mg by mouth daily.     Marland Kitchen  atorvastatin (LIPITOR) 40 MG tablet  Take 20 mg by mouth at bedtime.     Marland Kitchen  CALCIUM PO  Take 500 mg by mouth daily.     .  clopidogrel (PLAVIX) 75 MG tablet  Take 1 tablet (75 mg total) by mouth daily.  90 tablet  3   .  fluticasone (FLONASE) 50 MCG/ACT nasal spray  Place 2 sprays into both nostrils daily.     Marland Kitchen  lisinopril (PRINIVIL,ZESTRIL) 10 MG tablet  Take 1 tablet (10 mg total) by mouth daily.  30 tablet  11   .  loratadine (CLARITIN) 10 MG tablet  Take 10 mg by  mouth daily.     .  magnesium gluconate (MAGONATE) 500 MG tablet  Take 500 mg by mouth daily.     .  metoprolol tartrate (LOPRESSOR) 25 MG tablet  Take 12.5 mg by mouth 2 (two) times daily.     .  nitroGLYCERIN (NITROSTAT) 0.4 MG SL tablet  Place 1 tablet (0.4 mg total) under the tongue every 5 (five) minutes as needed for chest pain.  25 tablet  3   .  Omega-3  Fatty Acids (FISH OIL PO)  Take 1 capsule by mouth daily.      Surgical procedure: CARDIOTHORACIC SURGERY OPERATIVE NOTE  Date of Procedure: 05/03/2014  Preoperative Diagnosis:  Severe Aortic Stenosis  Severe Single-vessel Coronary Artery Disease Postoperative Diagnosis: Same  Procedure:  Aortic Valve Replacement [facility] Ease Pericardial Tissue Valve (size 69mm, model # 3300TFX, serial # [id number])  Coronary Artery Bypass Grafting x 1 Left Internal Mammary Artery to Distal Left Anterior Descending Coronary Artery  Surgeon: Valentina Gu. Roxy Manns, MD  Assistant: Vernie Murders, CRNFA  Anesthesia: Lillia Abed, MD  Operative Findings:  Bicuspid aortic valve with severe aortic stenosis  Normal LV systolic function  Good quality LIMA conduit for grafting  Good quality target vessel for grafting    Consults: None Postoperative hospital course: The patient has overall progressed nicely. He initially did develop some respiratory acidosis which slowed the weaning from the vent. He was however able to be weaned overnight. He is neurologically intact. He has remained hemodynamically stable. All routine lines, monitors and drainage devices have been discontinued in the standard fashion. He does have some postoperative volume overload but has responded to diuretics. He has a postoperative thrombocytopenia and heparin has been avoided in the postop setting. His most recent platelet count is 110,000 trending upwards. He does have an acute blood loss anemia with most recent hemoglobin 8.3. This is stable. Renal function has remained stable  postoperatively. Most recent BUN and creatinine are 22 and 0.97 respectively. He has had no significant postoperative cardiac dysrhythmias. He is tolerating gradually increasing activities using standard postoperative protocols. Incisions are noted to be healing well without evidence of infection. Oxygen has been weaned and he maintains good saturations on room air. His overall status is felt to be tentatively stable for discharge in the next 24-48 hours pending ongoing reevaluation of his recovery.    Medication List    STOP taking these medications       nitroGLYCERIN 0.4 MG SL tablet  Commonly known as:  NITROSTAT      TAKE these medications       aspirin 81 MG tablet  Take 81 mg by mouth daily.     atorvastatin 80 MG tablet  Commonly known as:  LIPITOR  Take 40 mg by mouth daily.     CALCIUM PO  Take 500 mg by mouth daily.     clopidogrel 75 MG tablet  Commonly known as:  PLAVIX  Take 75 mg by mouth daily with breakfast.     FISH OIL PO  Take 1 capsule by mouth daily.     fluticasone 50 MCG/ACT nasal spray  Commonly known as:  FLONASE  Place 1 spray into both nostrils daily.     lisinopril 20 MG tablet  Commonly known as:  PRINIVIL,ZESTRIL  Take 20 mg by mouth daily.     loratadine 10 MG tablet  Commonly known as:  CLARITIN  Take 10 mg by mouth daily.     magnesium gluconate 500 MG tablet  Commonly known as:  MAGONATE  Take 500 mg by mouth daily.     metoprolol tartrate 25 MG tablet  Commonly known as:  LOPRESSOR  Take 12.5 mg by mouth 2 (two) times daily.     traMADol 50 MG tablet  Commonly known as:  ULTRAM  Take 1-2 tablets (50-100 mg total) by mouth every 6 (six) hours as needed for moderate pain.        Discharge Exam: Blood pressure 138/71, pulse  72, temperature 98.1 F (36.7 C), temperature source Oral, resp. rate 16, height 5\' 11"  (1.803 m), weight 208 lb 5.4 oz (94.5 kg), SpO2 100.00%.  General appearance: alert, cooperative and no distress   Heart: regular rate and rhythm  Lungs: clear to auscultation bilaterally  Abdomen: benign  Extremities: no edema  Wound: incis healing well  Disposition: 01-Home or Self Care    Follow-up Information   Follow up with Rexene Alberts, MD. ([facility] 2015 at 4 PM to see the surgeon. Please obtain a chest x-ray at [facility] at 3 PM. [facility] imaging is located in the same office complex.)    Specialty:  Cardiothoracic Surgery   Contact information:   [place] [address] [phone]       Follow up with Richardson Dopp, PA-C. (05/27/14 at 2:20 pm)    Specialty:  Physician Assistant   Contact information:   [address] [place] [address] [phone]      Signed: John Giovanni 05/07/2014, 11:58 AM

## 2014-05-07 NOTE — Progress Notes (Addendum)
[address]       [place] [address]             [phone]      4 Days Post-Op Procedure(s) (LRB): AORTIC VALVE REPLACEMENT (AVR) (N/A) CORONARY ARTERY BYPASS GRAFTING (CABG) (N/A) INTRAOPERATIVE TRANSESOPHAGEAL ECHOCARDIOGRAM (N/A) Subjective: Feels ok, making good overall progress  Objective: Vital signs in last 24 hours: Temp:  [98 F (36.7 C)-98.6 F (37 C)] 98.1 F (36.7 C) (05/19 0500) Pulse Rate:  [64-111] 72 (05/19 0500) Cardiac Rhythm:  [-] Normal sinus rhythm (05/18 1200) Resp:  [11-18] 16 (05/19 0500) BP: (121-137)/(58-95) 130/66 mmHg (05/19 0500) SpO2:  [83 %-100 %] 100 % (05/19 0500) Weight:  [208 lb 5.4 oz (94.5 kg)] 208 lb 5.4 oz (94.5 kg) (05/19 0500)  Hemodynamic parameters for last 24 hours:    Intake/Output from previous day: 05/18 0701 - 05/19 0700 In: 240 [P.O.:240] Out: -  Intake/Output this shift:    General appearance: alert, cooperative and no distress Heart: regular rate and rhythm Lungs: dim in left base Abdomen: benign Extremities: no edema Wound: incis healing well  Lab Results:  Recent Labs  05/06/14 0430 05/07/14 0555  WBC 7.9 6.9  HGB 8.1* 8.3*  HCT 23.5* 23.8*  PLT 71* 110*   BMET:  Recent Labs  05/06/14 0430 05/07/14 0555  NA 132* 140  K 3.9 4.1  CL 97 104  CO2 26 27  GLUCOSE 113* 124*  BUN 23 22  CREATININE 0.99 0.97  CALCIUM 8.4 8.7    PT/INR: No results found for this basename: LABPROT, INR,  in the last 72 hours ABG    Component Value Date/Time   PHART 7.381 05/04/2014 0410   HCO3 21.8 05/04/2014 0410   TCO2 23 05/04/2014 1531   ACIDBASEDEF 2.5* 05/04/2014 0410   O2SAT 99.7 05/04/2014 0410   CBG (last 3)   Recent Labs  05/06/14 0036 05/06/14 0419 05/06/14 0728  GLUCAP 122* 110* 93   Scheduled Meds: . acetaminophen  1,000 mg Oral 4 times per day  . aspirin EC  81 mg Oral Daily  . atorvastatin  40 mg Oral Daily  . bisacodyl  10 mg Oral Daily   Or  . bisacodyl  10 mg  Rectal Daily  . docusate sodium  200 mg Oral Daily  . furosemide  40 mg Oral Daily  . lisinopril  10 mg Oral Daily  . metoprolol tartrate  12.5 mg Oral BID  . pantoprazole  40 mg Oral Daily  . potassium chloride  20 mEq Oral Daily  . sodium chloride  3 mL Intravenous Q12H   Continuous Infusions:  PRN Meds:.sodium chloride, ondansetron (ZOFRAN) IV, oxyCODONE, sodium chloride, traMADol  Dg Chest Port 1 View  05/06/2014   CLINICAL DATA:  Aortic valve disease  EXAM: PORTABLE CHEST - 1 VIEW  COMPARISON:  May 05, 2014  FINDINGS: Central catheter tip is in the superior vena cava. There is no pneumothorax appear on the right. Small apical pneumothorax on the left is not appreciably changed.  There is patchy atelectasis in the left upper and lower lobes, stable. The right lung is clear. Cardiomegaly is stable. The pulmonary vascularity is within normal limits. There is evidence of median sternotomy.  IMPRESSION: Persistent small left apical pneumothorax change. Patchy atelectasis throughout the left lung. Right lung clear. Stable cardiomegaly.   Electronically Signed   By: Lowella Grip M.D.   On: 05/06/2014 07:34   Assessment/Plan: S/P Procedure(s) (LRB):  AORTIC VALVE REPLACEMENT (AVR) (N/A) CORONARY ARTERY BYPASS GRAFTING (CABG) (N/A) INTRAOPERATIVE TRANSESOPHAGEAL ECHOCARDIOGRAM (N/A)  1 making good progress 2 cont pulm toilet/rehab 3 platelet count improved, poss start plavix soon. H/H stable 4 gentle diuresis 5 sugars controlled 6 d/c epw's 7 poss home in 1-2 days  LOS: 4 days    Travis Armstrong 05/07/2014  I have seen and examined the patient and agree with the assessment and plan as outlined.  Tentatively plan d/c home tomorrow.  Rexene Alberts 05/07/2014 10:50 AM

## 2014-05-08 MED ORDER — TRAMADOL HCL 50 MG PO TABS: 50.0000 mg | ORAL_TABLET | Freq: Four times a day (QID) | ORAL | Status: DC | PRN

## 2014-05-08 NOTE — Progress Notes (Signed)
CARDIAC REHAB PHASE I   Ed completed with pt and wife. Voiced understanding.  [place], ACSM 05/08/2014 10:55 AM

## 2014-05-08 NOTE — Progress Notes (Addendum)
[address]       [place] [address]             [phone]      5 Days Post-Op Procedure(s) (LRB): AORTIC VALVE REPLACEMENT (AVR) (N/A) CORONARY ARTERY BYPASS GRAFTING (CABG) (N/A) INTRAOPERATIVE TRANSESOPHAGEAL ECHOCARDIOGRAM (N/A) Subjective:  conts to do well   Objective: Vital signs in last 24 hours: Temp:  [98 F (36.7 C)-98.3 F (36.8 C)] 98.2 F (36.8 C) (05/20 0529) Pulse Rate:  [74-80] 75 (05/20 0529) Cardiac Rhythm:  [-] Normal sinus rhythm (05/19 1920) Resp:  [16-18] 18 (05/20 0529) BP: (120-138)/(60-72) 133/72 mmHg (05/20 0529) SpO2:  [95 %-97 %] 97 % (05/20 0529) Weight:  [208 lb 11.2 oz (94.666 kg)] 208 lb 11.2 oz (94.666 kg) (05/20 0529)  Hemodynamic parameters for last 24 hours:    Intake/Output from previous day: 05/19 0701 - 05/20 0700 In: 480 [P.O.:480] Out: -  Intake/Output this shift: Total I/O In: 240 [P.O.:240] Out: -   General appearance: alert, cooperative and no distress Heart: regular rate and rhythm Lungs: clear to auscultation bilaterally Abdomen: benign Extremities: no edema Wound: incis healing well  Lab Results:  Recent Labs  05/06/14 0430 05/07/14 0555  WBC 7.9 6.9  HGB 8.1* 8.3*  HCT 23.5* 23.8*  PLT 71* 110*   BMET:  Recent Labs  05/06/14 0430 05/07/14 0555  NA 132* 140  K 3.9 4.1  CL 97 104  CO2 26 27  GLUCOSE 113* 124*  BUN 23 22  CREATININE 0.99 0.97  CALCIUM 8.4 8.7    PT/INR: No results found for this basename: LABPROT, INR,  in the last 72 hours ABG    Component Value Date/Time   PHART 7.381 05/04/2014 0410   HCO3 21.8 05/04/2014 0410   TCO2 23 05/04/2014 1531   ACIDBASEDEF 2.5* 05/04/2014 0410   O2SAT 99.7 05/04/2014 0410   CBG (last 3)   Recent Labs  05/06/14 0036 05/06/14 0419 05/06/14 0728  GLUCAP 122* 110* 93   Scheduled Meds: . acetaminophen  1,000 mg Oral 4 times per day  . aspirin EC  81 mg Oral Daily  . atorvastatin  40 mg Oral Daily  . bisacodyl  10  mg Oral Daily   Or  . bisacodyl  10 mg Rectal Daily  . docusate sodium  200 mg Oral Daily  . furosemide  40 mg Oral Daily  . lisinopril  10 mg Oral Daily  . metoprolol tartrate  12.5 mg Oral BID  . pantoprazole  40 mg Oral Daily  . potassium chloride  20 mEq Oral Daily  . sodium chloride  3 mL Intravenous Q12H   Continuous Infusions:  PRN Meds:.sodium chloride, ondansetron (ZOFRAN) IV, oxyCODONE, sodium chloride, traMADol  Dg Chest 2 View  05/07/2014   CLINICAL DATA:  CABG and aortic valve replacement  EXAM: CHEST  2 VIEW  COMPARISON:  Prior chest x-ray 05/06/2014  FINDINGS: Stable cardiac and mediastinal contours. Atherosclerotic calcification in the transverse aorta. Resolved left apical pneumothorax. Persistent elevation of the left hemidiaphragm with small left effusion and associated basilar atelectasis. Overall, aeration of the left upper lung has improved. The right lung remains clear. Patient is status post median sternotomy with evidence of aortic valve repair. No acute osseous abnormality.  IMPRESSION: 1. Resolution of tiny left apical pneumothorax. 2. Improving inspiratory volumes. 3. Persistent elevation of the left hemidiaphragm, small left effusion and associated left basilar atelectasis.   Electronically Signed   By: Myrle Sheng  Laurence Ferrari M.D.   On: 05/07/2014 08:20   Assessment/Plan: S/P Procedure(s) (LRB): AORTIC VALVE REPLACEMENT (AVR) (N/A) CORONARY ARTERY BYPASS GRAFTING (CABG) (N/A) INTRAOPERATIVE TRANSESOPHAGEAL ECHOCARDIOGRAM (N/A) Plan for discharge: see discharge orders Doing very well  LOS: 5 days    Travis Armstrong 05/08/2014  I have seen and examined the patient and agree with the assessment and plan as outlined.  Rexene Alberts 05/08/2014 9:27 AM

## 2014-05-15 ENCOUNTER — Other Ambulatory Visit: Payer: Self-pay | Admitting: *Deleted

## 2014-05-15 ENCOUNTER — Ambulatory Visit (INDEPENDENT_AMBULATORY_CARE_PROVIDER_SITE_OTHER): Payer: Self-pay | Admitting: *Deleted

## 2014-05-15 ENCOUNTER — Ambulatory Visit
Admission: RE | Admit: 2014-05-15 | Discharge: 2014-05-15 | Disposition: A | Discharge status: Home / Self Care | Payer: Medicare Other | Source: Ambulatory Visit | Attending: Thoracic Surgery (Cardiothoracic Vascular Surgery) | Admitting: Thoracic Surgery (Cardiothoracic Vascular Surgery)

## 2014-05-15 ENCOUNTER — Encounter: Payer: Self-pay | Admitting: *Deleted

## 2014-05-15 DIAGNOSIS — I350 Nonrheumatic aortic (valve) stenosis: Secondary | ICD-10-CM

## 2014-05-15 DIAGNOSIS — Z4802 Encounter for removal of sutures: Secondary | ICD-10-CM

## 2014-05-15 DIAGNOSIS — J90 Pleural effusion, not elsewhere classified: Secondary | ICD-10-CM

## 2014-05-15 DIAGNOSIS — Z951 Presence of aortocoronary bypass graft: Secondary | ICD-10-CM

## 2014-05-15 DIAGNOSIS — I2510 Atherosclerotic heart disease of native coronary artery without angina pectoris: Secondary | ICD-10-CM

## 2014-05-15 DIAGNOSIS — Q231 Congenital insufficiency of aortic valve: Secondary | ICD-10-CM

## 2014-05-15 DIAGNOSIS — Z952 Presence of prosthetic heart valve: Secondary | ICD-10-CM

## 2014-05-15 NOTE — Progress Notes (Signed)
Travis Armstrong returns after CABG/AVR for suture removal from his previous chest tube sites.  Three sutures were easily removed.  The sites are healing well, only slightly reddened and the middle one has a scab.  His sternal incision looks great.  He does complain of trouble breathing when he bends down to tie his shoes, when he lies in bed.  He uses a thick pillow and can't use anything much higher.  Otherwise he uses a recliner.  He does not have any LE edema.  He says he is short of breath after he walks.  He also c/o of a slight sore throat.  On exam, it is not red.  I sent him for a chest xray. He was not prescribed Lasix on discharge.  I discussed the results with Dr Prescott Gum.  He will be scheduled for an U/S guided thoracentesis of his left pleural effusion...drained dry with no labs.  I explained the procedure to him and will call him with the date and time.  He and his wife understood.

## 2014-05-15 NOTE — Progress Notes (Signed)
Patient ID: Travis Armstrong, male   DOB: August 22, 1941, 73 y.o.   MRN: [id number] I called [name] and informed him of the date and  time for his U/S guided left thoracentesis..05/17/14 @12 :45 @ [facility] Rad[facility].  He understood.

## 2014-05-17 ENCOUNTER — Ambulatory Visit (HOSPITAL_COMMUNITY)
Admission: RE | Admit: 2014-05-17 | Discharge: 2014-05-17 | Disposition: A | Discharge status: Home / Self Care | Payer: Medicare Other | Source: Ambulatory Visit | Attending: Radiology | Admitting: Radiology

## 2014-05-17 ENCOUNTER — Ambulatory Visit (HOSPITAL_COMMUNITY)
Admission: RE | Admit: 2014-05-17 | Discharge: 2014-05-17 | Disposition: A | Discharge status: Home / Self Care | Payer: Medicare Other | Source: Ambulatory Visit | Attending: Cardiothoracic Surgery | Admitting: Cardiothoracic Surgery

## 2014-05-17 DIAGNOSIS — J90 Pleural effusion, not elsewhere classified: Secondary | ICD-10-CM | POA: Insufficient documentation

## 2014-05-17 NOTE — Procedures (Signed)
Successful US guided left thoracentesis. Yielded 1.5L of blood tinged fluid. Pt tolerated procedure well. No immediate complications.  Specimen was not sent for labs. CXR ordered.  Ascencion Dike PA-C 05/17/2014 1:37 PM

## 2014-05-18 ENCOUNTER — Telehealth: Payer: Self-pay | Admitting: Thoracic Surgery (Cardiothoracic Vascular Surgery)

## 2014-05-18 MED ORDER — TRAMADOL HCL 50 MG PO TABS: 50.0000 mg | ORAL_TABLET | Freq: Four times a day (QID) | ORAL | Status: DC | PRN

## 2014-05-18 NOTE — Telephone Encounter (Signed)
Patient called requesting ultram for pain after thoracentesis  Ultram prescription sent to [facility]

## 2014-05-20 ENCOUNTER — Telehealth: Payer: Self-pay | Admitting: *Deleted

## 2014-05-20 DIAGNOSIS — G8918 Other acute postprocedural pain: Secondary | ICD-10-CM

## 2014-05-20 MED ORDER — TRAMADOL HCL 50 MG PO TABS: 50.0000 mg | ORAL_TABLET | Freq: Four times a day (QID) | ORAL | Status: DC | PRN

## 2014-05-20 NOTE — Telephone Encounter (Signed)
During my conversation with him regarding his pain med refill, Travis Armstrong related that his pulse was in the 80's when normally it had been running in the 60's and his blood pressure was elevated ...160/90.  When reviewing his meds  he has only been taking 10 mgm of Lisinopril.  On discharge, he was prescribed 20 mgm of Lisinopril.  He understood and start this new dose.

## 2014-05-27 ENCOUNTER — Encounter: Payer: Self-pay | Admitting: Physician Assistant

## 2014-05-27 ENCOUNTER — Ambulatory Visit (INDEPENDENT_AMBULATORY_CARE_PROVIDER_SITE_OTHER): Payer: Medicare Other | Admitting: Physician Assistant

## 2014-05-27 ENCOUNTER — Telehealth: Payer: Self-pay | Admitting: *Deleted

## 2014-05-27 VITALS — BP 136/76 | HR 80 | Ht 71.0 in

## 2014-05-27 DIAGNOSIS — I2510 Atherosclerotic heart disease of native coronary artery without angina pectoris: Secondary | ICD-10-CM

## 2014-05-27 DIAGNOSIS — Z953 Presence of xenogenic heart valve: Secondary | ICD-10-CM

## 2014-05-27 DIAGNOSIS — E785 Hyperlipidemia, unspecified: Secondary | ICD-10-CM

## 2014-05-27 DIAGNOSIS — I359 Nonrheumatic aortic valve disorder, unspecified: Secondary | ICD-10-CM

## 2014-05-27 DIAGNOSIS — J90 Pleural effusion, not elsewhere classified: Secondary | ICD-10-CM

## 2014-05-27 DIAGNOSIS — Z952 Presence of prosthetic heart valve: Secondary | ICD-10-CM

## 2014-05-27 DIAGNOSIS — R42 Dizziness and giddiness: Secondary | ICD-10-CM

## 2014-05-27 DIAGNOSIS — I10 Essential (primary) hypertension: Secondary | ICD-10-CM

## 2014-05-27 LAB — BASIC METABOLIC PANEL
BUN: 14 mg/dL (ref 6–23)
CHLORIDE: 99 meq/L (ref 96–112)
CO2: 29 meq/L (ref 19–32)
CREATININE: 1.1 mg/dL (ref 0.4–1.5)
Calcium: 9 mg/dL (ref 8.4–10.5)
GFR: 70.46 mL/min (ref >60.00)
GLUCOSE: 110 mg/dL — AB (ref 70–99)
POTASSIUM: 4 meq/L (ref 3.5–5.1)
Sodium: 134 mEq/L — ABNORMAL LOW (ref 135–145)

## 2014-05-27 LAB — CBC WITH DIFFERENTIAL/PLATELET
Basophils Absolute: 0 10*3/uL (ref 0.0–0.1)
Basophils Relative: 0.4 % (ref 0.0–3.0)
EOS ABS: 0.2 10*3/uL (ref 0.0–0.7)
EOS PCT: 2 % (ref 0.0–5.0)
HEMATOCRIT: 35 % — AB (ref 39.0–52.0)
Hemoglobin: 11.4 g/dL — ABNORMAL LOW (ref 13.0–17.0)
LYMPHS ABS: 1.8 10*3/uL (ref 0.7–4.0)
Lymphocytes Relative: 20.9 % (ref 12.0–46.0)
MCHC: 32.6 g/dL (ref 30.0–36.0)
MCV: 95.3 fl (ref 78.0–100.0)
Monocytes Absolute: 1.1 10*3/uL — ABNORMAL HIGH (ref 0.1–1.0)
Monocytes Relative: 12.5 % — ABNORMAL HIGH (ref 3.0–12.0)
Neutro Abs: 5.5 10*3/uL (ref 1.4–7.7)
Neutrophils Relative %: 64.2 % (ref 43.0–77.0)
Platelets: 314 10*3/uL (ref 150.0–400.0)
RBC: 3.68 Mil/uL — ABNORMAL LOW (ref 4.22–5.81)
RDW: 13.8 % (ref 11.5–15.5)
WBC: 8.6 10*3/uL (ref 4.0–10.5)

## 2014-05-27 MED ORDER — LISINOPRIL 20 MG PO TABS: 20.0000 mg | ORAL_TABLET | Freq: Every day | ORAL | Status: DC

## 2014-05-27 NOTE — Telephone Encounter (Signed)
pt notified about lab results with verbal understanding to results 

## 2014-05-27 NOTE — Progress Notes (Signed)
Cardiology Office Note   Date:  05/27/2014   ID:  Travis Armstrong, DOB 12-24-1940, MRN [id number]  PCP:  Travis Seller, MD  Cardiologist:  Dr. Loralie Champagne      History of Present Illness: Travis Armstrong is a 73 y.o. male with a hx of CAD, aortic stenosis with bicuspid aortic valve, HTN, HL.  He was originally evaluated in 2011 for chest pain. ETT-Myoview with abnormal. Echocardiogram demonstrated no significant aortic stenosis but a bicuspid aortic valve. Heart catheterization 12/2010 demonstrated heavy calcification of the LAD and mid LAD 90% stenosis.  This was a small vessel and medical therapy was recommended. MRA ruled out aortic aneurysm. In the fall 2012, he had recurrent chest pain and cardiac catheterization demonstrate worsening mid LAD stenosis of 95% which was treated with PCI with a Promus DES. In the fall 2013 he had recurrent chest pain. LHC in 09/2012 demonstrated 99% stenosis in the proximal LAD which was treated with a Promus DES.  TEE in 12/2013 demonstrated moderate aortic stenosis. He was evaluated in 03/2014 to Dr. Aundra Dubin for recurrent exertional chest tightness.  LHC demonstrated significant in-stent restenosis in the mid LAD (by FFR).  He was referred for CABG plus AVR. He underwent LIMA-LAD and bioprosthetic AVR by Dr. Roxy Manns 05/03/14.  Postoperative course was fairly uneventful aside for volume overload and blood loss anemia. He remained in NSR.  He developed worsening L pleural effusion after discharge and underwent thoracentesis 5/29 yielding 1.5 L of pleural fluid.  He is now breathing better.  He is walking 15 mins twice a day without problems.  Chest is still sore.  No fevers or cough.  He denies orthopnea, PND, edema.  He denies syncope.  He does get lightheaded sometimes with standing.  Denies near syncope.     Studies:  - LHC (04/12/14):  Mid left main 30%, mid LAD stent with 70% ISR, ostial D2 90%.  FFR of LAD: Baseline FFR was 0.92. With infusion of IV adenosine, the FFR  was 0.74 (significant flow-limiting).  - Echo (04/12/14):   moderate LVH. EF 60% to 65%. Wall motion was normal,  Mod to severe AS (mean 35 mmHg).  Mild to mod MR.    - Carotid US (04/29/14):  Bilateral ICA 1-39%   Recent Labs: 04/08/2014: HDL Cholesterol by NMR 39.10; LDL (calc) 29  04/29/2014: ALT 24  05/07/2014: Creatinine 0.97; Hemoglobin 8.3*; Potassium 4.1   Wt Readings from Last 3 Encounters:  05/08/14 208 lb 11.2 oz (94.666 kg)  05/08/14 208 lb 11.2 oz (94.666 kg)  04/29/14 203 lb (92.08 kg)     Past Medical History:  1. Actinic Keratosis  2. Muscle strains  3. HTN  4. CAD: Anginal-type chest pain. ETT-myoview (1/12) showed good exercise tolerance but also inferior and V5/V6 1 mm ST depression and chest pain with exercise. Perfusion images showed reversible apical perfusion defect. Findings suggested ischemia. LHC (1/12): LAD heavily calcified with 90% calcified mid LAD stenosis after D2. The LAD was small caliber (2-2.5 mm) at this point. Because of this and his lack of unstable symptoms, I elected to manage him medically initially. ETT-myoview (11/12): 9'30" exercise with mild chest pain and ischemic ST changes. There was a small reversible apical perfusion defect, similar to the 1/12 study. LHC repeated 12/12 with 95% mid LAD stenosis after D2. Promus DES to mid LAD. Exertional chest pain recurred with LHC done again in 10/13. There was 99% proximal LAD stenosis just proximal to the stent. Patient received  a Promus DES.  LHC in 03/2014 with significant LAD ISR => CABG x 1 + AVR. 5. Bicuspid aortic valve with mild-moderate AS: Echo (1/12) showed EF 60-65%, bicuspid aortic valve with minimal stenosis (mean gradient 9 mmHg), grade II diastolic dysfunction, mild MR, mild LAE. MR angiogram chest (12/12): No thoracic aortic aneurysm. Echo (1/14) showed EF 65-70% with mild to moderate AS, mean gradient 22 mmHg. Echo (1/15) with moderate to severe AS, mean gradient 36, peak 55, AVA 1.0 cm^2. TEE  (1/15) with EF 60-65%, bicuspid aortic valve, AVA 1.0 cm^2 with mean gradient 26 mmHg, peak 48 mmHg, moderate MR, RV normal.   S/p bioprosthetic AVR 04/2014 6. Carotid dopplers (1/12): No significant stenosis. Carotid dopplers (10/14) with mild bilateral ICA stenosis.  7. Hyperlipidemia: Myalgias with Lipitor.  8. Mild bradycardia   Past Medical History  Diagnosis Date  . Keratosis, actinic   . Muscle strain   . HTN (hypertension)   . CAD (coronary artery disease)   . Hyperlipidemia   . Pneumonia 1958  . Bradycardia, sinus   . Aortic stenosis   . Bicuspid aortic valve   . Anginal pain     has not taken nitro  . Heart murmur   . Arthritis     back  . Cancer     skin cancer  . S/P aortic valve replacement with bioprosthetic valve and CABG x 05/03/2014    25 mm [facility] Ease bovine pericardial tissue valve  . S/P CABG x 1 05/03/2014    LIMA to LAD    Current Outpatient Prescriptions  Medication Sig Dispense Refill  . aspirin 81 MG tablet Take 81 mg by mouth daily.       Marland Kitchen atorvastatin (LIPITOR) 80 MG tablet Take 40 mg by mouth daily.      Marland Kitchen CALCIUM PO Take 500 mg by mouth daily.      . clopidogrel (PLAVIX) 75 MG tablet Take 75 mg by mouth daily with breakfast.      . fluticasone (FLONASE) 50 MCG/ACT nasal spray Place 1 spray into both nostrils daily.       Marland Kitchen lisinopril (PRINIVIL,ZESTRIL) 20 MG tablet Take 20 mg by mouth daily.      Marland Kitchen loratadine (CLARITIN) 10 MG tablet Take 10 mg by mouth daily.       . magnesium gluconate (MAGONATE) 500 MG tablet Take 500 mg by mouth daily.       . metoprolol tartrate (LOPRESSOR) 25 MG tablet Take 12.5 mg by mouth 2 (two) times daily.       . Omega-3 Fatty Acids (FISH OIL PO) Take 1 capsule by mouth daily.       . traMADol (ULTRAM) 50 MG tablet Take 1-2 tablets (50-100 mg total) by mouth every 6 (six) hours as needed for moderate pain.  30 tablet  0   No current facility-administered medications for this visit.    Allergies:    Penicillins   Social History:  The patient  reports that he quit smoking about 42 years ago. His smoking use included Cigarettes. He has a 8 pack-year smoking history. He has never used smokeless tobacco. He reports that he does not drink alcohol or use illicit drugs.   Family History:  The patient's family history includes Coronary artery disease in his brother. There is no history of Colon cancer, Esophageal cancer, Stomach cancer, or Rectal cancer.   ROS:  Please see the history of present illness.      All other systems  reviewed and negative.   PHYSICAL EXAM: VS:  BP 136/76  Pulse 80  Ht 5\' 11"  (1.803 m) Well nourished, well developed, in no acute distress HEENT: normal Neck: no JVD Cardiac:  normal S1, S2; RRR; no murmur Chest: Median sternotomy well healed without erythema or d/c Lungs:  Decreased breath sounds at the left base, no E-A. Changesotherwise no rales or wheezing Abd: soft, nontender, no hepatomegaly Ext: no edema Skin: warm and dry Neuro:  CNs 2-12 intact, no focal abnormalities noted  EKG:  NSR, HR 80, normal axis, inferior Q waves, nonspecific ST-T wave changes     ASSESSMENT AND PLAN:  1. Aortic valve disorders S/P aortic valve replacement with bioprosthetic valve and CABG x 1:  Doing better after recent thoracentesis after CABG+AVR.  He will start cardiac rehab soon.  We discussed the importance of SBE prophylaxis.  I will arrange a follow up echo.   2. Coronary artery disease:  Progressing well after recent CABG+AVR.  Continue ASA, Plavix, statin, beta blocker.  He will start cardiac rehab soon.  3. HYPERLIPIDEMIA-MIXED: continue statin.  4. Essential hypertension, benign:  Controlled.  He does have some orthostatic intolerance at times.  I have asked him to change his Lisinopril to QHS.  If this does not help, I would suggest decreasing his Lisinopril back to 10 mg QD.   5. Dizziness:  As above, I will adjust the timing of his ACEI.  Obtain a Basic Metabolic  Panel (BMET), CBC w/Diff .   6. Left Pleural Effusion s/p Thoracentesis:  He has follow up with Dr. Roxy Manns next week.  By exam, he likely still has some residual effusion.  However, he is much improved symptomatically. 7. Disposition:  F/u with Dr. Loralie Champagne in 6-8 weeks.    Signed, Versie Starks, MHS 05/27/2014 2:47 PM    [address], [place], [place]  [address] Phone: [phone]; Fax: [phone]

## 2014-05-27 NOTE — Patient Instructions (Signed)
CHANGE LISINOPRIL TO BETIME; IF STILL HAVING DIZZINESS AFTER ABOUT 1 WEEK FROM THIS CHANGE PLEASE CALL THE OFFICE TO ADVISE [phone]  GET OTC COLACE  LAB WORK TODAY; BMET, CBC W/DIFF  Your physician has requested that you have an echocardiogram. Echocardiography is a painless test that uses sound waves to create images of your heart. It provides your doctor with information about the size and shape of your heart and how well your heart's chambers and valves are working. This procedure takes approximately one hour. There are no restrictions for this procedure.  WE WILL [facility] TO [facility] [place] TO DR. Cassell Clement

## 2014-05-30 ENCOUNTER — Other Ambulatory Visit: Payer: Self-pay | Admitting: Thoracic Surgery (Cardiothoracic Vascular Surgery)

## 2014-05-30 DIAGNOSIS — I359 Nonrheumatic aortic valve disorder, unspecified: Secondary | ICD-10-CM

## 2014-06-03 ENCOUNTER — Ambulatory Visit
Admission: RE | Admit: 2014-06-03 | Discharge: 2014-06-03 | Disposition: A | Discharge status: Home / Self Care | Payer: Medicare Other | Source: Ambulatory Visit | Attending: Thoracic Surgery (Cardiothoracic Vascular Surgery) | Admitting: Thoracic Surgery (Cardiothoracic Vascular Surgery)

## 2014-06-03 ENCOUNTER — Ambulatory Visit (HOSPITAL_COMMUNITY)
Admission: RE | Admit: 2014-06-03 | Discharge: 2014-06-03 | Disposition: A | Discharge status: Home / Self Care | Payer: Medicare Other | Source: Ambulatory Visit | Attending: Cardiovascular Disease | Admitting: Cardiovascular Disease

## 2014-06-03 ENCOUNTER — Telehealth: Payer: Self-pay | Admitting: Cardiology

## 2014-06-03 ENCOUNTER — Ambulatory Visit (INDEPENDENT_AMBULATORY_CARE_PROVIDER_SITE_OTHER): Payer: Self-pay | Admitting: Surgical

## 2014-06-03 VITALS — BP 120/75 | HR 78 | Resp 19 | Ht 71.0 in | Wt 193.0 lb

## 2014-06-03 DIAGNOSIS — I359 Nonrheumatic aortic valve disorder, unspecified: Secondary | ICD-10-CM

## 2014-06-03 DIAGNOSIS — I517 Cardiomegaly: Secondary | ICD-10-CM

## 2014-06-03 DIAGNOSIS — I2510 Atherosclerotic heart disease of native coronary artery without angina pectoris: Secondary | ICD-10-CM

## 2014-06-03 NOTE — Progress Notes (Signed)
[address]       [place] [address]             [phone]                  Lucy D Wiegert [place] Medical Record #[id number] Date of Birth: 06/24/1941  Referring NG:[id number], Elby Showers, MD Primary Cardiology: Primary Care:WILSON,FRED Mallie Mussel, MD  Chief Complaint:  Follow Up Visit   History of Present Illness:    This 73 year old male is seen in routine office visit followup after her coronary artery bypass grafting x1 and aortic valve replacement with magna ease pericardial tissue valve for severe aortic stenosis and single-vessel coronary artery disease. Overall the patient states he is feeling well. He has had some mild difficulty with dizziness. He has started taking his lisinopril at night as recommended by cardiology but this doesn't appear to make much difference. Symptoms are mild and intermittent. He usually does has to sit for a second NA quickly dissipates. He denies shortness of breath. He denies significant incisional pain. He is having no fevers, chills or other constitutional symptoms.         Zubrod Score: At the time of surgery this patient's most appropriate activity status/level should be described as: []     0    Normal activity, no symptoms []     1    Restricted in physical strenuous activity but ambulatory, able to do out light work []     2    Ambulatory and capable of self care, unable to do work activities, up and about                 >50 % of waking hours                                                                                   []     3    Only limited self care, in bed greater than 50% of waking hours []     4    Completely disabled, no self care, confined to bed or chair []     5    Moribund  History  Smoking status  . Former Smoker -- 1.00 packs/day for 8 years  . Types: Cigarettes  . Quit date: 03/28/1972  Smokeless tobacco  . Never Used       Allergies  Allergen Reactions  . Penicillins     RASH ON FEET     Current Outpatient Prescriptions  Medication Sig Dispense Refill  . aspirin 81 MG tablet Take 81 mg by mouth daily.       Marland Kitchen atorvastatin (LIPITOR) 80 MG tablet Take 40 mg by mouth daily.      Marland Kitchen CALCIUM PO Take 500 mg by mouth daily.      . clopidogrel (PLAVIX) 75 MG tablet Take 75 mg by mouth daily with breakfast.      . fluticasone (FLONASE) 50 MCG/ACT nasal spray Place 1 spray into both nostrils daily.       Marland Kitchen lisinopril (PRINIVIL,ZESTRIL) 20 MG tablet Take 1 tablet (20 mg total) by mouth at bedtime.  90 tablet  3  .  loratadine (CLARITIN) 10 MG tablet Take 10 mg by mouth daily.       . magnesium gluconate (MAGONATE) 500 MG tablet Take 500 mg by mouth daily.       . metoprolol tartrate (LOPRESSOR) 25 MG tablet Take 12.5 mg by mouth 2 (two) times daily.       . Omega-3 Fatty Acids (FISH OIL PO) Take 1 capsule by mouth daily.       . traMADol (ULTRAM) 50 MG tablet Take 1-2 tablets (50-100 mg total) by mouth every 6 (six) hours as needed for moderate pain.  30 tablet  0   No current facility-administered medications for this visit.       Physical Exam: BP 120/75  Pulse 78  Resp 19  Ht 5\' 11"  (1.803 m)  Wt 193 lb (87.544 kg)  BMI 26.93 kg/m2  SpO2 96%  General appearance: alert, cooperative and no distress Heart: regular rate and rhythm, no rub and No murmur Lungs: Mildly diminished in the left base Abdomen: Benign exam Extremities: No edema Wound: Incisions healing well without evidence of infection.   Diagnostic Studies & Laboratory data:         Recent Radiology Findings: Dg Chest 2 View  06/03/2014   CLINICAL DATA:  Shortness of breath. Cardiac surgery in may, 2015. Aortic valve disorder.  EXAM: CHEST  2 VIEW  COMPARISON:  Multiple exams, including 05/17/2014  FINDINGS: Aortic valve prosthesis. Coronary stents. Blunted left costophrenic angle with stable elevation of the left hemidiaphragm. Heart size within normal limits. Mild volume loss/ atelectasis in the left  lower lobe, some of which may be passive.  Thoracic spondylosis.  IMPRESSION: 1. Stable mild elevation left hemidiaphragm with small pleural effusion a mild left basilar atelectasis. 2. Aortic valve prosthesis unchanged imposition and orientation. Coronary stents are visible.   Electronically Signed   By: Sherryl Barters M.D.   On: 06/03/2014 14:12      Recent Labs: Lab Results  Component Value Date   WBC 8.6 05/27/2014   HGB 11.4* 05/27/2014   HCT 35.0* 05/27/2014   PLT 314.0 05/27/2014   GLUCOSE 110* 05/27/2014   CHOL 85 04/08/2014   TRIG 85.0 04/08/2014   HDL 39.10 04/08/2014   LDLCALC 29 04/08/2014   ALT 24 04/29/2014   AST 33 04/29/2014   NA 134* 05/27/2014   K 4.0 05/27/2014   CL 99 05/27/2014   CREATININE 1.1 05/27/2014   BUN 14 05/27/2014   CO2 29 05/27/2014   INR 1.58* 05/03/2014   HGBA1C 5.7* 04/29/2014      Assessment / Plan:  The patient is overall doing well. We discussed activity and driving progression. We'll see the patient again in 3 months or sooner when necessary for any surgically related issues or at request.          GOLD,WAYNE E 06/03/2014 2:43 PM

## 2014-06-03 NOTE — Patient Instructions (Signed)
The patient was given verbal instructions regarding activity progression and driving

## 2014-06-03 NOTE — Progress Notes (Signed)
2D Echo Performed 06/03/2014    Travis Armstrong, RCS

## 2014-06-03 NOTE — Telephone Encounter (Signed)
New Prob   Pt has been experiencing mild dizziness and weakness to the lower extremities intermittently for last week. States he feels this may be due to recent change in medication (Lisinopril). Requesting to speak to a nurse.

## 2014-06-03 NOTE — Telephone Encounter (Signed)
Pt states symptoms the same as at time of office visit with Brynda Rim 05/27/14. Reviewed with Nicki Reaper today. He recommended pt decrease lisinopril to 10mg  daily at bedtime. If symptoms do not resolve in a few days pt should see his PCP. Pt advised, verbalized understanding.

## 2014-06-04 ENCOUNTER — Telehealth: Payer: Self-pay | Admitting: *Deleted

## 2014-06-04 NOTE — Telephone Encounter (Signed)
Follow up ° ° ° ° °Want echo results °

## 2014-06-04 NOTE — Telephone Encounter (Signed)
lmptcb to go over echo results  

## 2014-06-04 NOTE — Telephone Encounter (Signed)
pt notified about echo results with verbal understanding 

## 2014-07-04 ENCOUNTER — Inpatient Hospital Stay (HOSPITAL_COMMUNITY)
Admission: RE | Admit: 2014-07-04 | Discharge: 2014-07-04 | Disposition: A | Discharge status: Home / Self Care | Payer: Medicare Other | Source: Ambulatory Visit

## 2014-07-04 NOTE — Progress Notes (Signed)
Cardiac Rehab Medication Review by a Pharmacist  Does the patient  feel that his/her medications are working for him/her?  yes  Has the patient been experiencing any side effects to the medications prescribed?  no  Does the patient measure his/her own blood pressure or blood glucose at home?  yes   Does the patient have any problems obtaining medications due to transportation or finances?   no  Understanding of regimen: good Understanding of indications: good Potential of compliance: good   Pharmacist comments: Patient has a good understanding of his medications. He brought in the medications with him.  Roderic Palau A. Pincus Badder, PharmD Clinical Pharmacist - Resident Pager: [phone] Pharmacy: [phone] 07/04/2014 8:23 AM

## 2014-07-08 ENCOUNTER — Encounter (HOSPITAL_COMMUNITY)
Admission: RE | Admit: 2014-07-08 | Discharge: 2014-07-08 | Disposition: A | Discharge status: Home / Self Care | Payer: Medicare Other | Source: Ambulatory Visit | Attending: Cardiology | Admitting: Cardiology

## 2014-07-08 DIAGNOSIS — Z5189 Encounter for other specified aftercare: Secondary | ICD-10-CM | POA: Diagnosis not present

## 2014-07-08 DIAGNOSIS — I2510 Atherosclerotic heart disease of native coronary artery without angina pectoris: Secondary | ICD-10-CM | POA: Diagnosis not present

## 2014-07-08 DIAGNOSIS — I209 Angina pectoris, unspecified: Secondary | ICD-10-CM | POA: Diagnosis not present

## 2014-07-08 DIAGNOSIS — Z951 Presence of aortocoronary bypass graft: Secondary | ICD-10-CM | POA: Diagnosis not present

## 2014-07-08 DIAGNOSIS — R0989 Other specified symptoms and signs involving the circulatory and respiratory systems: Secondary | ICD-10-CM | POA: Diagnosis not present

## 2014-07-08 DIAGNOSIS — I359 Nonrheumatic aortic valve disorder, unspecified: Secondary | ICD-10-CM | POA: Insufficient documentation

## 2014-07-08 NOTE — Progress Notes (Addendum)
Pt in today for his first day of cardiac rehab phase II program s/p cabg x 1 lima.  Pt prior participant in cardiac rehab. Pt tolerated light exercise with no complaints.  Pt expressed some complaints of dizziness over the week end after getting out of his recliner chair.  Will check orthostatic bp. Sitting bp 124/78 standing 110/70. BP today during exercise have been wnl. Pt reports that when he saw the [place], [place].  He commented about feeling dizzy.  Lisinopril decreased to 10 mg daily.  Pt did not notice any improvement over a 2 week period.  Since the dizziness didn't stop he decided to go back to taking 20 mg daily.  Pt did not call the office for clarification or alert Dr. Aundra Dubin that he was resuming his old dose.  Pt advised to decrease the Lisinopril back to 10 mg as directed by medical staff at dr. Aundra Dubin office.  Pt reported that he was outside over the weekend.  Of note the weekend was hot and humid with temps in the mid 90's and high humidity.  Encourage pt to increase his H20 intake particularly on warm days.  Monitor showed SR with no ectopy noted.  Medication list reconciled.  PHQ2 score 0.  Pt feels positive about his recovery and has supportive spouse.  Short term goals get stronger and long term is to return to playing golf and bowling,  Will measure short term goals with met levels and overall tolerance to workloads.  Long term goals will fall within the guidelines of sternal precautions and advisement by surgeon when it is appropriate to return to these activities.  Continue to monitor progress toward this goal.  Maurice Small RN, BSN

## 2014-07-10 ENCOUNTER — Encounter (HOSPITAL_COMMUNITY)
Admission: RE | Admit: 2014-07-10 | Discharge: 2014-07-10 | Disposition: A | Discharge status: Home / Self Care | Payer: Medicare Other | Source: Ambulatory Visit | Attending: Cardiology | Admitting: Cardiology

## 2014-07-10 DIAGNOSIS — Z5189 Encounter for other specified aftercare: Secondary | ICD-10-CM | POA: Diagnosis not present

## 2014-07-10 NOTE — Progress Notes (Signed)
Pt returned today for second day of exercise at 6:45.  Pt asked RN to look at his incision. Pt stated that he developed a scab area that wasn't present before.  Pt placed neosporin and applied a band aid.  Peeled ban daid back to observe area.  Band aid with scant bloody drainage.  Pt thinks it may be a "sun spot". He reports that he has had them before on his back.  Area superficial with forming scab noted.  No redness or tenderness noted around the area.  No yellow drainage or order detected. Clean ban daid applied.  Called office for further instructions.  Office doesn't open until 9am.  Advised pt to call and speak to [facility] regarding the need to be seen or continue monitoring.  Cherre Huger, BSN

## 2014-07-11 ENCOUNTER — Encounter: Payer: Self-pay | Admitting: Cardiology

## 2014-07-11 ENCOUNTER — Ambulatory Visit (INDEPENDENT_AMBULATORY_CARE_PROVIDER_SITE_OTHER): Payer: Medicare Other | Admitting: Cardiology

## 2014-07-11 VITALS — BP 130/82 | HR 58 | Ht 71.0 in | Wt 199.0 lb

## 2014-07-11 DIAGNOSIS — I2510 Atherosclerotic heart disease of native coronary artery without angina pectoris: Secondary | ICD-10-CM

## 2014-07-11 DIAGNOSIS — Z953 Presence of xenogenic heart valve: Secondary | ICD-10-CM

## 2014-07-11 DIAGNOSIS — I359 Nonrheumatic aortic valve disorder, unspecified: Secondary | ICD-10-CM

## 2014-07-11 DIAGNOSIS — Z951 Presence of aortocoronary bypass graft: Secondary | ICD-10-CM

## 2014-07-11 DIAGNOSIS — Z952 Presence of prosthetic heart valve: Secondary | ICD-10-CM

## 2014-07-11 MED ORDER — FUROSEMIDE 20 MG PO TABS: ORAL_TABLET | ORAL | Status: DC

## 2014-07-11 MED ORDER — LISINOPRIL 5 MG PO TABS: 5.0000 mg | ORAL_TABLET | Freq: Every day | ORAL | Status: DC

## 2014-07-11 MED ORDER — POTASSIUM CHLORIDE CRYS ER 10 MEQ PO TBCR: EXTENDED_RELEASE_TABLET | ORAL | Status: DC

## 2014-07-11 NOTE — Patient Instructions (Signed)
Decrease lisinopril to 5mg  daily.   Take  lasix 20mg  daily for 4 days. If you gain 3-4 pounds in 1 week take 1 tablet. Take a KCL(potassium) if you take a lasix.  Take KCL(potassium) 22mEq daily for 4 days.   Your physician recommends that you return for a FASTING lipid profile /BMET in 2 weeks.   Your physician recommends that you schedule a follow-up appointment in: 2 months with Dr Aundra Dubin.

## 2014-07-12 ENCOUNTER — Encounter (HOSPITAL_COMMUNITY)
Admission: RE | Admit: 2014-07-12 | Discharge: 2014-07-12 | Disposition: A | Discharge status: Home / Self Care | Payer: Medicare Other | Source: Ambulatory Visit | Attending: Cardiology | Admitting: Cardiology

## 2014-07-12 DIAGNOSIS — Z5189 Encounter for other specified aftercare: Secondary | ICD-10-CM | POA: Diagnosis not present

## 2014-07-12 NOTE — Progress Notes (Signed)
Patient ID: Mattix Imhof Bonillas, male   DOB: 11/20/1941, 73 y.o.   MRN: [id number] PCP: Dr. Redmond Pulling  72 yo with history of HTN presented initially for evaluation of chest pain in 2011.  It occurred with splitting wood and after walking about 1/4 mile on flat ground.  Given these exertional symptoms, I set him up for an ETT-myoview. This showed evidence for ischemia by exercise ECG, and there was a small reversible apical perfusion defect possibly suggestive of ischemia as well.  Echo to assess for cause of systolic murmur showed a bicuspid aortic valve with preserved LV systolic function and no significant aortic stenosis.  I set him up for a left heart cath in 1/12, which showed heavy calcification in the LAD.  There was a long 90% mid LAD stenosis.  The LAD was small at this point, a 2-2.5 mm vessel.  I decided to proceed with medical treatment as his symptoms were only with moderate exertion and as the LAD was a small caliber vessel at the site of the stenosis.  Given the bicuspid aortic valve, I did an MR angiogram of the thoracic aorta, which ruled out aortic aneurysm.  In the fall of 2012, he had recurrent chest pain and I repeated a cath, this showed worsening of the mid LAD stenosis up to 95%.  He underwent PCI with Promus DES to the mid LAD in 12/12.  In the fall of 2013, he again developed exertional chest pain.  I did a left heart cath in 10/13 showing 99% stenosis in the proximal LAD just proximal to the stent.  He had a Promus DES placed to the proximal LAD.  In 1/15, patient had echo concerning for moderate-severe AS (progression).  TEE was done to assess more closely, this was more consistent with moderate AS. Patient again developed exertional chest pain in 4/15 and had LHC showing 80% mLAD in-stent restenosis.  Echo was done again in 4/15, suggestive of moderate to severe AS.  Patient was then taken for LIMA-LAD with bioprosthetic AVR by Dr. Roxy Manns in 5/15.  Post-op echo in 6/15 showed normal EF and  well-seated bioprosthetic aortic valve.   Patient has done well since getting home from surgery.  No significant chest pain.  He is mildly short of breath walking up a hill.  No problems on flat ground.  He is doing cardiac rehab.  No orthopnea or PND.  Still gets lightheaded with standing.      Labs (10/11): LDL 113, HDL 46, K 5.1, creatinine 1.1, LFTs normal Labs (1/12): K 5.3, creatinine 1.1 Labs (3/12): LDL 51, HDL 37 Labs (12/12): K 4.1, creatinine 0.98 Labs (4/13): LDL 53, HDL 45 Labs (9/13): LDL 54, HDL 46, LFTs normal, TSH normal, K 4.7, creatinine 0.9 Labs (3/14): LDL 36, HDL 38 Labs (10/14): K 4.5, creatinine 1.1, AST 40, ALT 42, LDL 27, HDL 44 Labs (6/15): K 4, creatinine 1.1, HCT 55  Allergies (verified):  1)  ! Pcn  Past Medical History: 1. Actinic Keratosis 2. Muscle strains  3. HTN 4. CAD: Anginal-type chest pain.  ETT-myoview (1/12) showed good exercise tolerance but also inferior and V5/V6 1 mm ST depression and chest pain with exercise.  Perfusion images showed reversible apical perfusion defect.  Findings suggested ischemia.  LHC (1/12): LAD heavily calcified with 90% calcified mid LAD stenosis after D2.  The LAD was small caliber (2-2.5 mm) at this point.  Because of this and his lack of unstable symptoms, I elected to manage him  medically initially.  ETT-myoview (11/12): 9'30" exercise with mild chest pain and ischemic ST changes.  There was a small reversible apical perfusion defect, similar to the 1/12 study.  LHC repeated 12/12 with 95% mid LAD stenosis after D2.  Promus DES to mid LAD.  Exertional chest pain recurred with LHC done again in 10/13.  There was 99% proximal LAD stenosis just proximal to the stent.  Patient received a Promus DES.   LHC (4/15) with 80% mLAD in-stent restenosis and small to moderate D2 with 90% proximal stenosis.  Patient had LIMA-LAD along with AVR in 5/15.  5. Bicuspid aortic valve with mild-moderate AS: Echo (1/12) showed EF 60-65%,  bicuspid aortic valve with minimal stenosis (mean gradient 9 mmHg), grade II diastolic dysfunction, mild MR, mild LAE.  MR angiogram chest (12/12): No thoracic aortic aneurysm.  Echo (1/14) showed EF 65-70% with mild to moderate AS, mean gradient 22 mmHg.  Echo (1/15) with moderate to severe AS, mean gradient 36, peak 55, AVA 1.0 cm^2.  TEE (1/15) with EF 60-65%, bicuspid aortic valve, AVA 1.0 cm^2 with mean gradient 26 mmHg, peak 48 mmHg, moderate MR, RV normal.  Echo (4/15) with EF 60-65%, moderate LVH, moderate to severe AS with mean gradient 35 mmHg and AVA 0.7 cm^2.  Patient had bioprosthetic AVR in 5/15.  Echo post-op in 6/15 with EF 55-60%, normal bioprosthetic aortic valve.  6. Carotid dopplers (1/12): No significant stenosis.  Carotid dopplers (10/14) with mild bilateral ICA stenosis.  7.  Hyperlipidemia: Myalgias with Lipitor.  8.  Mild bradycardia  Family History: 2 brothers with CAD diagnosed in their 39s.  Sister with diabetes, died of complications from this disease.   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    Current Outpatient Prescriptions  Medication Sig Dispense Refill  . aspirin 81 MG tablet Take 81 mg by mouth daily.       Marland Kitchen atorvastatin (LIPITOR) 80 MG tablet Take 40 mg by mouth daily.      . calcium carbonate (OS-CAL) 600 MG TABS tablet Take 600 mg by mouth daily with breakfast.      . clopidogrel (PLAVIX) 75 MG tablet Take 75 mg by mouth daily with breakfast.      . ipratropium (ATROVENT) 0.03 % nasal spray Place 1 spray into both nostrils 2 (two) times daily.      Marland Kitchen lisinopril (PRINIVIL,ZESTRIL) 20 MG tablet Take 10 mg by mouth daily.       Marland Kitchen loratadine (CLARITIN) 10 MG tablet Take 10 mg by mouth daily.       . magnesium gluconate (MAGONATE) 500 MG tablet Take 500 mg by mouth  daily.       . metoprolol tartrate (LOPRESSOR) 25 MG tablet Take 12.5 mg by mouth 2 (two) times daily.       . Multiple Vitamins-Minerals (PRESERVISION AREDS 2) CAPS Take 2 capsules by mouth daily.      . Omega-3 Fatty Acids (FISH OIL PO) Take 1 capsule by mouth daily.       . furosemide (LASIX) 20 MG tablet 1 daily for 4 days then 1 daily as needed  30 tablet  3  . lisinopril (PRINIVIL,ZESTRIL) 5 MG tablet Take 1 tablet (5 mg total) by mouth daily.  30 tablet  3  . potassium chloride (K-DUR,KLOR-CON)  10 MEQ tablet 1 daily for 4 days then 1 daily as needed  30 tablet  3   No current facility-administered medications for this visit.    BP 130/82  Pulse 58  Ht 5\' 11"  (1.803 m)  Wt 199 lb (90.266 kg)  BMI 27.77 kg/m2 General:  Well developed, well nourished, in no acute distress. Neck:  Neck supple, JVP 8 xm. No masses, thyromegaly or abnormal cervical nodes. Lungs:  Slight decreased breath sounds right base.  Heart:  Non-displaced PMI, chest non-tender; regular rate and rhythm, S1, S2.  2/6 early SEM. +S4. Carotid upstroke normal.  Pedals normal pulses. Trace ankle edema. Abdomen:  Bowel sounds positive; abdomen soft and non-tender without masses, organomegaly, or hernias noted. No hepatosplenomegaly. Extremities:  No clubbing or cyanosis. Neurologic:  Alert and oriented x 3. Psych:  Normal affect.  Assessment/Plan:  AORTIC VALVE DISORDERS Bicuspid aortic valve with moderate-severe AS and no associated thoracic aortic aneurysm now s/p bioprosthetic AVR.  The valve was well-seated on 6/15 echo.  Coronary artery disease  Status post PCI with DES to proximal LAD in 10/13.  Continue ASA 81, ACEI, metoprolol, and statin.  He had recurrent in-stent restenosis of LAD stent in 4/15, now s/p LIMA-LAD.   HYPERLIPIDEMIA Check lipids with next labs. Chronic diastolic CHF On exam, Mr Kemmer is mildly volume overloaded.  Some dyspnea with hills.   I will have him take Lasix 20 mg daily + KCl 10 mEq  daily x 4 days.  Then he can take it after that only prn (gain 3-4 lbs in a week).  BMET in 2 wks.  Orthostatic symptoms Decrease lisinopril to 5 mg daily.   Loralie Champagne 07/12/2014

## 2014-07-15 ENCOUNTER — Encounter (HOSPITAL_COMMUNITY): Payer: Medicare Other

## 2014-07-17 ENCOUNTER — Encounter (HOSPITAL_COMMUNITY)
Admission: RE | Admit: 2014-07-17 | Discharge: 2014-07-17 | Disposition: A | Discharge status: Home / Self Care | Payer: Medicare Other | Source: Ambulatory Visit | Attending: Cardiology | Admitting: Cardiology

## 2014-07-17 DIAGNOSIS — Z5189 Encounter for other specified aftercare: Secondary | ICD-10-CM | POA: Diagnosis not present

## 2014-07-17 NOTE — Progress Notes (Signed)
I have reviewed home exercise with Travis Armstrong. The patient was advised to walk 2-4 days per week outside of CRP II for 30 minutes continuously.  Pt will also complete one additional day of hand weights outside of CRP II.  Progression of exercise prescription was discussed.  Reviewed THR, pulse, RPE, sign and symptoms, NTG use and when to call 911 or MD.  Pt voiced understanding.  Archie Endo, MS, ACSM RCEP 07/17/2014 8:18 AM

## 2014-07-19 ENCOUNTER — Encounter (HOSPITAL_COMMUNITY)
Admission: RE | Admit: 2014-07-19 | Discharge: 2014-07-19 | Disposition: A | Discharge status: Home / Self Care | Payer: Medicare Other | Source: Ambulatory Visit | Attending: Cardiology | Admitting: Cardiology

## 2014-07-19 DIAGNOSIS — Z5189 Encounter for other specified aftercare: Secondary | ICD-10-CM | POA: Diagnosis not present

## 2014-07-19 NOTE — Progress Notes (Signed)
Travis Armstrong 73 y.o. male Nutrition Note Spoke with pt.  Nutrition Survey reviewed with pt. Pt is not following the Therapeutic Lifestyle Changes diet. Per discussion, pt's wife does not like low-fat milk, wheat bread, or low-fat mayo or salad dressings and pt does not insist on having heart healthier foods in the house. Pt states he wants to lose wt. Pt has not been actively trying to lose wt. Wt loss tips reviewed. Pt expressed understanding of the information reviewed. Pt aware of nutrition education classes offered and reports he previously attended nutrition classes "3 years ago" when he was in Cardiac Rehab.  Nutrition Diagnosis   Food-and nutrition-related knowledge deficit related to lack of exposure to information as related to diagnosis of: ? CVD ? Pre-DM (A1c 5.7) ?   Overweight related to excessive energy intake as evidenced by a BMI of 28.2  Nutrition Intervention   Benefits of adopting Therapeutic Lifestyle Changes discussed when Medficts reviewed.   Pt to attend the Portion Distortion class   Pt given handouts for: ? Nutrition I class ? Nutrition II class   Continue client-centered nutrition education by RD, as part of interdisciplinary care.  Goal(s)   Pt to identify and limit food sources of saturated fat, trans fat, and cholesterol   Pt to identify food quantities necessary to achieve: ? wt loss to a goal wt of 186-190 lb (84.5-86.4 kg) at graduation from cardiac rehab.   Monitor and Evaluate progress toward nutrition goal with team. Nutrition Risk:  Low   Derek Mound, M.Ed, RD, LDN, CDE 07/19/2014 7:45 AM

## 2014-07-22 ENCOUNTER — Encounter (HOSPITAL_COMMUNITY): Payer: Medicare Other

## 2014-07-24 ENCOUNTER — Encounter (HOSPITAL_COMMUNITY): Payer: Medicare Other

## 2014-07-26 ENCOUNTER — Encounter (HOSPITAL_COMMUNITY): Payer: Medicare Other

## 2014-07-29 ENCOUNTER — Encounter (HOSPITAL_COMMUNITY): Payer: Medicare Other

## 2014-07-31 ENCOUNTER — Encounter (HOSPITAL_COMMUNITY)
Admission: RE | Admit: 2014-07-31 | Discharge: 2014-07-31 | Disposition: A | Discharge status: Home / Self Care | Payer: Medicare Other | Source: Ambulatory Visit | Attending: Cardiology | Admitting: Cardiology

## 2014-07-31 ENCOUNTER — Other Ambulatory Visit (INDEPENDENT_AMBULATORY_CARE_PROVIDER_SITE_OTHER): Payer: Medicare Other

## 2014-07-31 DIAGNOSIS — Z951 Presence of aortocoronary bypass graft: Secondary | ICD-10-CM | POA: Diagnosis present

## 2014-07-31 DIAGNOSIS — Z954 Presence of other heart-valve replacement: Secondary | ICD-10-CM | POA: Insufficient documentation

## 2014-07-31 DIAGNOSIS — I359 Nonrheumatic aortic valve disorder, unspecified: Secondary | ICD-10-CM

## 2014-07-31 DIAGNOSIS — I2510 Atherosclerotic heart disease of native coronary artery without angina pectoris: Secondary | ICD-10-CM

## 2014-07-31 LAB — BASIC METABOLIC PANEL
BUN: 14 mg/dL (ref 6–23)
CO2: 28 mEq/L (ref 19–32)
Calcium: 9.8 mg/dL (ref 8.4–10.5)
Chloride: 104 mEq/L (ref 96–112)
Creatinine, Ser: 1.2 mg/dL (ref 0.4–1.5)
GFR: 62.43 mL/min (ref >60.00)
Glucose, Bld: 87 mg/dL (ref 70–99)
POTASSIUM: 4.9 meq/L (ref 3.5–5.1)
SODIUM: 137 meq/L (ref 135–145)

## 2014-07-31 LAB — LIPID PANEL
CHOLESTEROL: 80 mg/dL (ref 0–200)
HDL: 37.8 mg/dL — AB (ref >39.00)
LDL Cholesterol: 32 mg/dL (ref 0–99)
NonHDL: 42.2
Total CHOL/HDL Ratio: 2
Triglycerides: 50 mg/dL (ref 0.0–149.0)
VLDL: 10 mg/dL (ref 0.0–40.0)

## 2014-08-02 ENCOUNTER — Encounter (HOSPITAL_COMMUNITY)
Admission: RE | Admit: 2014-08-02 | Discharge: 2014-08-02 | Disposition: A | Discharge status: Home / Self Care | Payer: Medicare Other | Source: Ambulatory Visit | Attending: Cardiology | Admitting: Cardiology

## 2014-08-02 DIAGNOSIS — Z951 Presence of aortocoronary bypass graft: Secondary | ICD-10-CM | POA: Diagnosis not present

## 2014-08-05 ENCOUNTER — Encounter (HOSPITAL_COMMUNITY)
Admission: RE | Admit: 2014-08-05 | Discharge: 2014-08-05 | Disposition: A | Discharge status: Home / Self Care | Payer: Medicare Other | Source: Ambulatory Visit | Attending: Cardiology | Admitting: Cardiology

## 2014-08-05 DIAGNOSIS — Z951 Presence of aortocoronary bypass graft: Secondary | ICD-10-CM | POA: Diagnosis not present

## 2014-08-07 ENCOUNTER — Encounter (HOSPITAL_COMMUNITY)
Admission: RE | Admit: 2014-08-07 | Discharge: 2014-08-07 | Disposition: A | Discharge status: Home / Self Care | Payer: Medicare Other | Source: Ambulatory Visit | Attending: Cardiology | Admitting: Cardiology

## 2014-08-07 DIAGNOSIS — Z951 Presence of aortocoronary bypass graft: Secondary | ICD-10-CM | POA: Diagnosis not present

## 2014-08-09 ENCOUNTER — Encounter (HOSPITAL_COMMUNITY)
Admission: RE | Admit: 2014-08-09 | Discharge: 2014-08-09 | Disposition: A | Discharge status: Home / Self Care | Payer: Medicare Other | Source: Ambulatory Visit | Attending: Cardiology | Admitting: Cardiology

## 2014-08-09 DIAGNOSIS — Z951 Presence of aortocoronary bypass graft: Secondary | ICD-10-CM | POA: Diagnosis not present

## 2014-08-12 ENCOUNTER — Encounter (HOSPITAL_COMMUNITY)
Admission: RE | Admit: 2014-08-12 | Discharge: 2014-08-12 | Disposition: A | Discharge status: Home / Self Care | Payer: Medicare Other | Source: Ambulatory Visit | Attending: Cardiology | Admitting: Cardiology

## 2014-08-12 DIAGNOSIS — Z951 Presence of aortocoronary bypass graft: Secondary | ICD-10-CM | POA: Diagnosis not present

## 2014-08-14 ENCOUNTER — Encounter (HOSPITAL_COMMUNITY)
Admission: RE | Admit: 2014-08-14 | Discharge: 2014-08-14 | Disposition: A | Discharge status: Home / Self Care | Payer: Medicare Other | Source: Ambulatory Visit | Attending: Cardiology | Admitting: Cardiology

## 2014-08-14 DIAGNOSIS — Z951 Presence of aortocoronary bypass graft: Secondary | ICD-10-CM | POA: Diagnosis not present

## 2014-08-16 ENCOUNTER — Encounter (HOSPITAL_COMMUNITY)
Admission: RE | Admit: 2014-08-16 | Discharge: 2014-08-16 | Disposition: A | Discharge status: Home / Self Care | Payer: Medicare Other | Source: Ambulatory Visit | Attending: Cardiology | Admitting: Cardiology

## 2014-08-16 DIAGNOSIS — Z951 Presence of aortocoronary bypass graft: Secondary | ICD-10-CM | POA: Diagnosis not present

## 2014-08-19 ENCOUNTER — Encounter (HOSPITAL_COMMUNITY)
Admission: RE | Admit: 2014-08-19 | Discharge: 2014-08-19 | Disposition: A | Discharge status: Home / Self Care | Payer: Medicare Other | Source: Ambulatory Visit | Attending: Cardiology | Admitting: Cardiology

## 2014-08-19 ENCOUNTER — Encounter (HOSPITAL_COMMUNITY): Payer: Medicare Other

## 2014-08-19 ENCOUNTER — Telehealth: Payer: Self-pay | Admitting: Cardiology

## 2014-08-19 DIAGNOSIS — Z951 Presence of aortocoronary bypass graft: Secondary | ICD-10-CM | POA: Diagnosis not present

## 2014-08-19 NOTE — Telephone Encounter (Signed)
Pt has appt with Dr Roxy Manns 09/09/14. He will call Dr Guy Sandifer office or ask Dr Roxy Manns at time of appt if OK to return to playing gold.

## 2014-08-19 NOTE — Telephone Encounter (Signed)
New message     When can pt start playing golf----he had bypass surgery in may.

## 2014-08-21 ENCOUNTER — Encounter (HOSPITAL_COMMUNITY)
Admission: RE | Admit: 2014-08-21 | Discharge: 2014-08-21 | Disposition: A | Discharge status: Home / Self Care | Payer: Medicare Other | Source: Ambulatory Visit | Attending: Cardiology | Admitting: Cardiology

## 2014-08-21 DIAGNOSIS — Z954 Presence of other heart-valve replacement: Secondary | ICD-10-CM | POA: Diagnosis present

## 2014-08-21 DIAGNOSIS — Z951 Presence of aortocoronary bypass graft: Secondary | ICD-10-CM | POA: Insufficient documentation

## 2014-08-23 ENCOUNTER — Encounter (HOSPITAL_COMMUNITY)
Admission: RE | Admit: 2014-08-23 | Discharge: 2014-08-23 | Disposition: A | Discharge status: Home / Self Care | Payer: Medicare Other | Source: Ambulatory Visit | Attending: Cardiology | Admitting: Cardiology

## 2014-08-23 DIAGNOSIS — Z951 Presence of aortocoronary bypass graft: Secondary | ICD-10-CM | POA: Diagnosis not present

## 2014-08-28 ENCOUNTER — Encounter (HOSPITAL_COMMUNITY)
Admission: RE | Admit: 2014-08-28 | Discharge: 2014-08-28 | Disposition: A | Discharge status: Home / Self Care | Payer: Medicare Other | Source: Ambulatory Visit | Attending: Cardiology | Admitting: Cardiology

## 2014-08-28 DIAGNOSIS — Z951 Presence of aortocoronary bypass graft: Secondary | ICD-10-CM | POA: Diagnosis not present

## 2014-08-30 ENCOUNTER — Encounter (HOSPITAL_COMMUNITY)
Admission: RE | Admit: 2014-08-30 | Discharge: 2014-08-30 | Disposition: A | Discharge status: Home / Self Care | Payer: Medicare Other | Source: Ambulatory Visit | Attending: Cardiology | Admitting: Cardiology

## 2014-08-30 DIAGNOSIS — Z951 Presence of aortocoronary bypass graft: Secondary | ICD-10-CM | POA: Diagnosis not present

## 2014-09-02 ENCOUNTER — Encounter (HOSPITAL_COMMUNITY)
Admission: RE | Admit: 2014-09-02 | Discharge: 2014-09-02 | Disposition: A | Discharge status: Home / Self Care | Payer: Medicare Other | Source: Ambulatory Visit | Attending: Cardiology | Admitting: Cardiology

## 2014-09-02 DIAGNOSIS — Z951 Presence of aortocoronary bypass graft: Secondary | ICD-10-CM | POA: Diagnosis not present

## 2014-09-04 ENCOUNTER — Encounter (HOSPITAL_COMMUNITY)
Admission: RE | Admit: 2014-09-04 | Discharge: 2014-09-04 | Disposition: A | Discharge status: Home / Self Care | Payer: Medicare Other | Source: Ambulatory Visit | Attending: Cardiology | Admitting: Cardiology

## 2014-09-04 DIAGNOSIS — Z951 Presence of aortocoronary bypass graft: Secondary | ICD-10-CM | POA: Diagnosis not present

## 2014-09-06 ENCOUNTER — Encounter (HOSPITAL_COMMUNITY)
Admission: RE | Admit: 2014-09-06 | Discharge: 2014-09-06 | Disposition: A | Discharge status: Home / Self Care | Payer: Medicare Other | Source: Ambulatory Visit | Attending: Cardiology | Admitting: Cardiology

## 2014-09-06 DIAGNOSIS — Z951 Presence of aortocoronary bypass graft: Secondary | ICD-10-CM | POA: Diagnosis not present

## 2014-09-09 ENCOUNTER — Ambulatory Visit (INDEPENDENT_AMBULATORY_CARE_PROVIDER_SITE_OTHER): Payer: Medicare Other | Admitting: Thoracic Surgery (Cardiothoracic Vascular Surgery)

## 2014-09-09 ENCOUNTER — Encounter: Payer: Self-pay | Admitting: Thoracic Surgery (Cardiothoracic Vascular Surgery)

## 2014-09-09 ENCOUNTER — Encounter (HOSPITAL_COMMUNITY)
Admission: RE | Admit: 2014-09-09 | Discharge: 2014-09-09 | Disposition: A | Discharge status: Home / Self Care | Payer: Medicare Other | Source: Ambulatory Visit | Attending: Cardiology | Admitting: Cardiology

## 2014-09-09 VITALS — BP 140/80 | HR 67 | Resp 20 | Ht 71.0 in | Wt 199.0 lb

## 2014-09-09 DIAGNOSIS — Z951 Presence of aortocoronary bypass graft: Secondary | ICD-10-CM

## 2014-09-09 DIAGNOSIS — I359 Nonrheumatic aortic valve disorder, unspecified: Secondary | ICD-10-CM

## 2014-09-09 DIAGNOSIS — Z954 Presence of other heart-valve replacement: Secondary | ICD-10-CM

## 2014-09-09 DIAGNOSIS — Z952 Presence of prosthetic heart valve: Secondary | ICD-10-CM

## 2014-09-09 DIAGNOSIS — I2510 Atherosclerotic heart disease of native coronary artery without angina pectoris: Secondary | ICD-10-CM

## 2014-09-09 DIAGNOSIS — Z953 Presence of xenogenic heart valve: Secondary | ICD-10-CM

## 2014-09-09 NOTE — Patient Instructions (Signed)

## 2014-09-09 NOTE — Progress Notes (Signed)
[address]       [place] [address]             [phone]     CARDIOTHORACIC SURGERY OFFICE NOTE  Referring Provider is Larey Dresser, MD PCP is Woody Seller, MD   HPI:  Patient returns for routine followup status post aortic valve replacement using a bioprosthetic tissue valve and coronary artery bypass grafting x1 on 05/03/2014.  His postoperative course was uncomplicated and he was seen most recently in the office on 06/03/2014. Since then he has been seen in followup by Dr. Aundra Dubin. Followup echocardiogram performed 06/03/2014 demonstrates normal left ventricular systolic function with normal functioning bioprosthetic tissue valve in the aortic position.  Mean transvalvular gradient across the aortic valve was only 5 mm mercury.  Since then the patient has continued to do very well. He has participated an outpatient cardiac rehabilitation program and made steady improvement. He states that he feels quite well. He gets tired if he pushes himself, but overall her stamina has improved. He no longer has any pain in his chest. He denies any exertional chest discomfort or shortness of breath. His appetite is good. He sleeping well at night. He is now back playing golf.   Current Outpatient Prescriptions  Medication Sig Dispense Refill  . aspirin 81 MG tablet Take 81 mg by mouth daily.       Marland Kitchen atorvastatin (LIPITOR) 80 MG tablet Take 40 mg by mouth daily.      . calcium carbonate (OS-CAL) 600 MG TABS tablet Take 600 mg by mouth daily with breakfast.      . clopidogrel (PLAVIX) 75 MG tablet Take 75 mg by mouth daily with breakfast.      . furosemide (LASIX) 20 MG tablet 1 daily for 4 days then 1 daily as needed  30 tablet  3  . ipratropium (ATROVENT) 0.03 % nasal spray Place 1 spray into both nostrils 2 (two) times daily.      Marland Kitchen lisinopril (PRINIVIL,ZESTRIL) 5 MG tablet Take 1 tablet (5 mg total) by mouth daily.  30 tablet  3  . loratadine (CLARITIN) 10 MG  tablet Take 10 mg by mouth daily.       . magnesium gluconate (MAGONATE) 500 MG tablet Take 500 mg by mouth daily.       . metoprolol tartrate (LOPRESSOR) 25 MG tablet Take 12.5 mg by mouth 2 (two) times daily.       . Multiple Vitamins-Minerals (PRESERVISION AREDS 2) CAPS Take 2 capsules by mouth daily.      . Omega-3 Fatty Acids (FISH OIL PO) Take 1 capsule by mouth daily.       . potassium chloride (K-DUR,KLOR-CON) 10 MEQ tablet 1 daily for 4 days then 1 daily as needed  30 tablet  3   No current facility-administered medications for this visit.      Physical Exam:   BP 140/80  Pulse 67  Resp 20  Ht 5\' 11"  (1.803 m)  Wt 199 lb (90.266 kg)  BMI 27.77 kg/m2  SpO2 97%  General:  Well-appearing  Chest:   clear  CV:   Regular rate and rhythm without murmur  Incisions:  Completely healed, sternum is stable  Abdomen:  Soft and nontender  Extremities:  Warm and well-perfused  Diagnostic Tests:  Transthoracic Echocardiography  Patient: Travis Armstrong, [place] MR #: [id number] Study Date: 06/03/2014 Gender: M Age: 73 Height: 180.3 cm Weight: 87.5 kg BSA: 2.11 m^2 Pt.  Status: Room:  ATTENDING Shelva Majestic, M.D. Faylene Million, Scott T REFERRING [place], Satartia T REFERRING Loralie Champagne, M.D. SONOGRAPHER Marygrace Drought, RCS PERFORMING [facility], Outpatient  cc:  ------------------------------------------------------------------- LV EF: 55% - 60%  ------------------------------------------------------------------- Indications: V43.3 S/P Heart valve replacement.  ------------------------------------------------------------------- History: PMH: Dizziness, Hyperlipidemia. Coronary artery disease.  ------------------------------------------------------------------- Study Conclusions  - Left ventricle: The cavity size was normal. There was mild focal basal hypertrophy of the septum. Systolic function was normal. The estimated ejection fraction was in the range of 55% to 60%. Wall  motion was normal; there were no regional wall motion abnormalities. Doppler parameters are consistent with abnormal left ventricular relaxation (grade 1 diastolic dysfunction). - Aortic valve: A bioprosthesis was present. - Mitral valve: Calcified annulus. - Left atrium: The atrium was mildly dilated. - Right ventricle: The cavity size was mildly dilated. - Right atrium: The atrium was mildly dilated.  Impressions:  - Normal LV function; mild LAE/RAE/RVE; well seated prosthetic aortic valve with normal gradients. Compared to 04/12/14, prosthetic aortic valve is new.  Transthoracic echocardiography. M-mode, complete 2D, spectral Doppler, and color Doppler. Birthdate: Patient birthdate: [birth year]. Age: Patient is 73 yr old. Sex: Gender: male. Height: Height: 180.3 cm. Height: 71 in. Weight: Weight: 87.5 kg. Weight: 192.6 lb. Body mass index: BMI: 26.9 kg/m^2. Body surface area: BSA: 2.11 m^2. Blood pressure: 136/76 Patient status: Outpatient. Study date: Study date: 06/03/2014. Study time: 08:12 AM. Location: Echo laboratory.  -------------------------------------------------------------------  ------------------------------------------------------------------- Left ventricle: The cavity size was normal. There was mild focal basal hypertrophy of the septum. Systolic function was normal. The estimated ejection fraction was in the range of 55% to 60%. Wall motion was normal; there were no regional wall motion abnormalities. Doppler parameters are consistent with abnormal left ventricular relaxation (grade 1 diastolic dysfunction).  ------------------------------------------------------------------- Aortic valve: Normal thickness leaflets. A bioprosthesis was present. Mobility was not restricted. Doppler: Transvalvular velocity was within the normal range. There was no stenosis. There was no regurgitation. Indexed valve area (VTI): 0.82 cm^2/m^2. Indexed valve area (Vmax): 0.81  cm^2/m^2. Mean gradient (S): 5 mm Hg.  ------------------------------------------------------------------- Aorta: Aortic root: The aortic root was normal in size.  ------------------------------------------------------------------- Mitral valve: Calcified annulus. Mobility was not restricted. Doppler: Transvalvular velocity was within the normal range. There was no evidence for stenosis. There was trivial regurgitation.  ------------------------------------------------------------------- Left atrium: LA Volume/ BSA = 26.9 ml/m2. The atrium was mildly dilated.  ------------------------------------------------------------------- Right ventricle: The cavity size was mildly dilated. Systolic function was normal.  ------------------------------------------------------------------- Pulmonic valve: Doppler: Transvalvular velocity was within the normal range. There was no evidence for stenosis. There was trivial regurgitation.  ------------------------------------------------------------------- Tricuspid valve: Structurally normal valve. Doppler: Transvalvular velocity was within the normal range. There was trivial regurgitation.  ------------------------------------------------------------------- Pulmonary artery: Systolic pressure was within the normal range.  ------------------------------------------------------------------- Right atrium: The atrium was mildly dilated.  ------------------------------------------------------------------- Pericardium: There was no pericardial effusion.  ------------------------------------------------------------------- Systemic veins: Inferior vena cava: The vessel was normal in size. The respirophasic diameter changes were in the normal range (= 50%), consistent with normal central venous pressure. Diameter: 14 mm.  ------------------------------------------------------------------- Prepared and Electronically Authenticated by  Kirk Ruths 2015-06-15T09:16:39  ------------------------------------------------------------------- Measurements  IVC Value 04/12/2014 Reference ID 14 mm ---------- ---------  Left ventricle Value 04/12/2014 Reference LV ID, ED, PLAX (L) 34.8 mm 42.2 43 - 52 chordal LV ID, ES, PLAX (N) 27.6 mm 24.5 23 - 38 chordal LV fx shortening, PLAX (L) 21 % 42 >=29 chordal LV PW thickness, ED 11 mm 15.4 --------- IVS/LV  PW ratio, ED (H) 1.47 1.02 <=1.3 Stroke volume, 2D 56 ml ---------- --------- Stroke volume/bsa, 2D 27 ml/m^2 ---------- --------- LV ejection fraction, 51 % ---------- --------- 1-p R6E LV end-diastolic 72 ml ---------- --------- volume, 2-p LV end-systolic 37 ml ---------- --------- volume, 2-p LV ejection fraction, 49 % ---------- --------- 2-p Stroke volume, 2-p 35 ml ---------- --------- LV end-diastolic 34 ml/m^2 ---------- --------- volume/bsa, 2-p LV end-systolic 18 ml/m^2 ---------- --------- volume/bsa, 2-p Stroke volume/bsa, 2-p 16.6 ml/m^2 ---------- --------- LV e&', lateral 10.2 cm/s ---------- --------- LV E/e&', lateral 6.87 ---------- --------- LV e&', medial 6.47 cm/s ---------- --------- LV E/e&', medial 10.83 ---------- --------- LV e&', average 8.34 cm/s ---------- --------- LV E/e&', average 8.41 ---------- ---------  Ventricular septum Value 04/12/2014 Reference IVS thickness, ED 16.2 mm 15.7 ---------  LVOT Value 04/12/2014 Reference LVOT ID, S 20 mm 20 --------- LVOT area 3.14 cm^2 3.14 ---------  Aortic valve Value 04/12/2014 Reference Aortic valve peak 154 cm/s 377 --------- velocity, S Aortic valve mean 111 cm/s 279 --------- velocity, S Aortic valve VTI, S 29.5 cm 109 --------- Aortic mean gradient, 5 mm Hg 35 --------- S Aortic valve area/bsa, 0.82 cm^2/m^2 0.31 --------- VTI Aortic valve area/bsa, 0.81 cm^2/m^2 0.33 --------- peak velocity  Aorta Value 04/12/2014 Reference Aortic root ID, ED 33 mm 35 ---------  Left  atrium Value 04/12/2014 Reference LA ID, A-P, ES 39 mm 34 --------- LA ID/bsa, A-P (N) 1.85 cm/m^2 1.57 <=2.2  Mitral valve Value 04/12/2014 Reference Mitral E-wave peak 70.1 cm/s ---------- --------- velocity Mitral A-wave peak 59.7 cm/s ---------- --------- velocity Mitral deceleration (N) 218 ms ---------- 150 - 230 time Mitral E/A ratio, peak 1.2 ---------- ---------  Pulmonary arteries Value 04/12/2014 Reference PA pressure, S, DP (N) 19 mm Hg ---------- <=30  Tricuspid valve Value 04/12/2014 Reference Tricuspid regurg peak 198 cm/s ---------- --------- velocity Tricuspid peak RV-RA 16 mm Hg ---------- --------- gradient Tricuspid maximal 198 cm/s ---------- --------- regurg velocity, PISA  Systemic veins Value 04/12/2014 Reference Estimated CVP 3 mm Hg ---------- ---------  Right ventricle Value 04/12/2014 Reference RV pressure, S, DP (N) 19 mm Hg ---------- <=30 RV s&', lateral, S 12.3 cm/s ---------- ---------  Legend: (L) and (H) mark values outside specified reference range.  (N) marks values inside specified reference range.    Impression:  Patient is doing very well status post aortic valve replacement using a bioprosthetic tissue valve and coronary artery bypass grafting x1.  Plan:  I've encouraged patient to continue to gradually increase his activity without any particular limitations. We have not made any changes to his current medications. He has been reminded regarding the life long need for antibiotic prophylaxis for all dental cleaning and related procedures. All of his questions been addressed.  I spent in excess of 15 minutes during the conduct of this office consultation and >50% of this time involved direct face-to-face encounter with the patient for counseling and/or coordination of their care.   Valentina Gu. Roxy Manns, MD 09/09/2014 2:31 PM

## 2014-09-11 ENCOUNTER — Encounter (HOSPITAL_COMMUNITY)
Admission: RE | Admit: 2014-09-11 | Discharge: 2014-09-11 | Disposition: A | Discharge status: Home / Self Care | Payer: Medicare Other | Source: Ambulatory Visit | Attending: Cardiology | Admitting: Cardiology

## 2014-09-11 DIAGNOSIS — Z951 Presence of aortocoronary bypass graft: Secondary | ICD-10-CM | POA: Diagnosis not present

## 2014-09-13 ENCOUNTER — Encounter (HOSPITAL_COMMUNITY)
Admission: RE | Admit: 2014-09-13 | Discharge: 2014-09-13 | Disposition: A | Discharge status: Home / Self Care | Payer: Medicare Other | Source: Ambulatory Visit | Attending: Cardiology | Admitting: Cardiology

## 2014-09-13 DIAGNOSIS — Z951 Presence of aortocoronary bypass graft: Secondary | ICD-10-CM | POA: Diagnosis not present

## 2014-09-16 ENCOUNTER — Encounter (HOSPITAL_COMMUNITY)
Admission: RE | Admit: 2014-09-16 | Discharge: 2014-09-16 | Disposition: A | Discharge status: Home / Self Care | Payer: Medicare Other | Source: Ambulatory Visit | Attending: Cardiology | Admitting: Cardiology

## 2014-09-16 DIAGNOSIS — Z951 Presence of aortocoronary bypass graft: Secondary | ICD-10-CM | POA: Diagnosis not present

## 2014-09-18 ENCOUNTER — Encounter (HOSPITAL_COMMUNITY)
Admission: RE | Admit: 2014-09-18 | Discharge: 2014-09-18 | Disposition: A | Discharge status: Home / Self Care | Payer: Medicare Other | Source: Ambulatory Visit | Attending: Cardiology | Admitting: Cardiology

## 2014-09-18 DIAGNOSIS — Z951 Presence of aortocoronary bypass graft: Secondary | ICD-10-CM | POA: Diagnosis not present

## 2014-09-20 ENCOUNTER — Encounter (HOSPITAL_COMMUNITY)
Admission: RE | Admit: 2014-09-20 | Discharge: 2014-09-20 | Disposition: A | Discharge status: Home / Self Care | Payer: Medicare Other | Source: Ambulatory Visit | Attending: Cardiology | Admitting: Cardiology

## 2014-09-20 DIAGNOSIS — Z951 Presence of aortocoronary bypass graft: Secondary | ICD-10-CM | POA: Diagnosis not present

## 2014-09-20 DIAGNOSIS — Z954 Presence of other heart-valve replacement: Secondary | ICD-10-CM | POA: Insufficient documentation

## 2014-09-23 ENCOUNTER — Encounter (HOSPITAL_COMMUNITY)
Admission: RE | Admit: 2014-09-23 | Discharge: 2014-09-23 | Disposition: A | Discharge status: Home / Self Care | Payer: Medicare Other | Source: Ambulatory Visit | Attending: Cardiology | Admitting: Cardiology

## 2014-09-23 ENCOUNTER — Encounter: Payer: Self-pay | Admitting: Cardiology

## 2014-09-23 ENCOUNTER — Ambulatory Visit (INDEPENDENT_AMBULATORY_CARE_PROVIDER_SITE_OTHER): Payer: Medicare Other | Admitting: Cardiology

## 2014-09-23 VITALS — BP 142/82 | HR 58 | Ht 71.0 in | Wt 198.1 lb

## 2014-09-23 DIAGNOSIS — I2510 Atherosclerotic heart disease of native coronary artery without angina pectoris: Secondary | ICD-10-CM

## 2014-09-23 DIAGNOSIS — I359 Nonrheumatic aortic valve disorder, unspecified: Secondary | ICD-10-CM

## 2014-09-23 DIAGNOSIS — Z951 Presence of aortocoronary bypass graft: Secondary | ICD-10-CM | POA: Diagnosis not present

## 2014-09-23 DIAGNOSIS — Z953 Presence of xenogenic heart valve: Secondary | ICD-10-CM

## 2014-09-23 DIAGNOSIS — E785 Hyperlipidemia, unspecified: Secondary | ICD-10-CM

## 2014-09-23 DIAGNOSIS — Z954 Presence of other heart-valve replacement: Secondary | ICD-10-CM

## 2014-09-23 MED ORDER — LISINOPRIL 10 MG PO TABS: 10.0000 mg | ORAL_TABLET | Freq: Every day | ORAL | Status: DC

## 2014-09-23 MED ORDER — NITROGLYCERIN 0.4 MG SL SUBL: 0.4000 mg | SUBLINGUAL_TABLET | SUBLINGUAL | Status: AC | PRN

## 2014-09-23 NOTE — Patient Instructions (Signed)
Increase lisinopril to 10mg  daily. You can take 2 of your 5mg  tablets daily at the same time and use your current supply.  Your physician recommends that you return for lab work in: 2 weeks--BMET.  Your physician wants you to follow-up in: 4 months with Dr Aundra Dubin. (February 2016).  You will receive a reminder letter in the mail two months in advance. If you don't receive a letter, please call our office to schedule the follow-up appointment.

## 2014-09-24 NOTE — Progress Notes (Signed)
Patient ID: Travis Armstrong, male   DOB: 1941/06/10, 73 y.o.   MRN: [id number] PCP: Dr. Redmond Pulling  Travis Armstrong with history of HTN presented initially for evaluation of chest pain in 2011.  It occurred with splitting wood and after walking about 1/4 mile on flat ground.  Given these exertional symptoms, I set him up for an ETT-myoview. This showed evidence for ischemia by exercise ECG, and there was a small reversible apical perfusion defect possibly suggestive of ischemia as well.  Echo to assess for cause of systolic murmur showed a bicuspid aortic valve with preserved LV systolic function and no significant aortic stenosis.  I set him up for a left heart cath in 1/12, which showed heavy calcification in the LAD.  There was a long 90% mid LAD stenosis.  The LAD was small at this point, a 2-2.5 mm vessel.  I decided to proceed with medical treatment as his symptoms were only with moderate exertion and as the LAD was a small caliber vessel at the site of the stenosis.  Given the bicuspid aortic valve, I did an Travis angiogram of the thoracic aorta, which ruled out aortic aneurysm.  In the fall of 2012, he had recurrent chest pain and I repeated a cath, this showed worsening of the mid LAD stenosis up to 95%.  He underwent PCI with Promus DES to the mid LAD in 12/12.  In the fall of 2013, he again developed exertional chest pain.  I did a left heart cath in 10/13 showing 99% stenosis in the proximal LAD just proximal to the stent.  He had a Promus DES placed to the proximal LAD.  In 1/15, patient had echo concerning for moderate-severe AS (progression).  TEE was done to assess more closely, this was more consistent with moderate AS. Patient again developed exertional chest pain in 4/15 and had LHC showing 80% mLAD in-stent restenosis.  Echo was done again in 4/15, suggestive of moderate to severe AS.  Patient was then taken for LIMA-LAD with bioprosthetic AVR by Dr. Roxy Manns in 5/15.  Post-op echo in 6/15 showed normal EF and  well-seated bioprosthetic aortic valve.   Patient has done well since getting home from surgery.  No significant chest pain.  He is mildly short of breath walking up a hill.  No problems on flat ground.  He is doing cardiac rehab.  No orthopnea or PND.  He is back to bowling and golfing.  SBP running up to the 150s now (at a prior appt, I had cut back on his lisinopril).  Weight is down 1 lb.   Labs (10/11): LDL 113, HDL 46, K 5.1, creatinine 1.1, LFTs normal Labs (1/12): K 5.3, creatinine 1.1 Labs (3/12): LDL 51, HDL 37 Labs (12/12): K 4.1, creatinine 0.98 Labs (4/13): LDL 53, HDL 45 Labs (9/13): LDL 54, HDL 46, LFTs normal, TSH normal, K 4.7, creatinine 0.9 Labs (3/14): LDL 36, HDL 38 Labs (10/14): K 4.5, creatinine 1.1, AST 40, ALT 42, LDL 27, HDL 44 Labs (6/15): K 4, creatinine 1.1, HCT 55 Labs (8/15): K 4.9, creatinine 1.2, LDL 32  Allergies (verified):  1)  ! Pcn  Past Medical History: 1. Actinic Keratosis 2. Muscle strains  3. HTN 4. CAD: Anginal-type chest pain.  ETT-myoview (1/12) showed good exercise tolerance but also inferior and V5/V6 1 mm ST depression and chest pain with exercise.  Perfusion images showed reversible apical perfusion defect.  Findings suggested ischemia.  LHC (1/12): LAD heavily calcified with 90%  calcified mid LAD stenosis after D2.  The LAD was small caliber (2-2.5 mm) at this point.  Because of this and his lack of unstable symptoms, I elected to manage him medically initially.  ETT-myoview (11/12): 9'30" exercise with mild chest pain and ischemic ST changes.  There was a small reversible apical perfusion defect, similar to the 1/12 study.  LHC repeated 12/12 with 95% mid LAD stenosis after D2.  Promus DES to mid LAD.  Exertional chest pain recurred with LHC done again in 10/13.  There was 99% proximal LAD stenosis just proximal to the stent.  Patient received a Promus DES.   LHC (4/15) with 80% mLAD in-stent restenosis and small to moderate D2 with 90%  proximal stenosis.  Patient had LIMA-LAD along with AVR in 5/15.  5. Bicuspid aortic valve with mild-moderate AS: Echo (1/12) showed EF 60-65%, bicuspid aortic valve with minimal stenosis (mean gradient 9 mmHg), grade II diastolic dysfunction, mild Travis, mild LAE.  Travis angiogram chest (12/12): No thoracic aortic aneurysm.  Echo (1/14) showed EF 65-70% with mild to moderate AS, mean gradient 22 mmHg.  Echo (1/15) with moderate to severe AS, mean gradient 36, peak 55, AVA 1.0 cm^2.  TEE (1/15) with EF 60-65%, bicuspid aortic valve, AVA 1.0 cm^2 with mean gradient 26 mmHg, peak 48 mmHg, moderate Travis, RV normal.  Echo (4/15) with EF 60-65%, moderate LVH, moderate to severe AS with mean gradient 35 mmHg and AVA 0.7 cm^2.  Patient had bioprosthetic AVR in 5/15.  Echo post-op in 6/15 with EF 55-60%, normal bioprosthetic aortic valve.  6. Carotid dopplers (1/12): No significant stenosis.  Carotid dopplers (10/14) with mild bilateral ICA stenosis.  7.  Hyperlipidemia: Myalgias with Lipitor.  8.  Mild bradycardia  Family History: 2 brothers with CAD diagnosed in their 69s.  Sister with diabetes, died of complications from this disease.   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    Current Outpatient Prescriptions  Medication Sig Dispense Refill  . aspirin 81 MG tablet Take 81 mg by mouth daily.       Marland Kitchen atorvastatin (LIPITOR) 80 MG tablet Take 40 mg by mouth daily.      . calcium carbonate (OS-CAL) 600 MG TABS tablet Take 600 mg by mouth daily with breakfast.      . clopidogrel (PLAVIX) 75 MG tablet Take 75 mg by mouth daily with breakfast.      . furosemide (LASIX) 20 MG tablet 1 daily for 4 days then 1 daily as needed  30 tablet  3  . ipratropium (ATROVENT) 0.03 % nasal spray Place 1 spray into both nostrils 2 (two)  times daily.      Marland Kitchen loratadine (CLARITIN) 10 MG tablet Take 10 mg by mouth daily.       . magnesium gluconate (MAGONATE) 500 MG tablet Take 500 mg by mouth daily.       . metoprolol tartrate (LOPRESSOR) 25 MG tablet Take 12.5 mg by mouth 2 (two) times daily.       . Multiple Vitamins-Minerals (PRESERVISION AREDS 2) CAPS Take 2 capsules by mouth daily.      . Omega-3 Fatty Acids (FISH OIL PO) Take 1 capsule by mouth daily.       . potassium chloride (K-DUR,KLOR-CON) 10 MEQ tablet 1 daily for 4  days then 1 daily as needed  30 tablet  3  . lisinopril (PRINIVIL,ZESTRIL) 10 MG tablet Take 1 tablet (10 mg total) by mouth daily.  90 tablet  3  . nitroGLYCERIN (NITROSTAT) 0.4 MG SL tablet Place 1 tablet (0.4 mg total) under the tongue every 5 (five) minutes as needed for chest pain.  90 tablet  3   No current facility-administered medications for this visit.    BP 142/82  Pulse 58  Ht 5\' 11"  (1.803 m)  Wt 198 lb 1.9 oz (89.867 kg)  BMI 27.64 kg/m2  SpO2 96% General:  Well developed, well nourished, in no acute distress. Neck:  Neck supple, JVP 7 cm. No masses, thyromegaly or abnormal cervical nodes. Lungs:  Slight decreased breath sounds right base.  Heart:  Non-displaced PMI, chest non-tender; regular rate and rhythm, S1, S2.  2/6 early SEM. +S4. Carotid upstroke normal.  Pedals normal pulses. Trace ankle edema.  Right radial pulse weaker than left radial.  Abdomen:  Bowel sounds positive; abdomen soft and non-tender without masses, organomegaly, or hernias noted. No hepatosplenomegaly. Extremities:  No clubbing or cyanosis. Neurologic:  Alert and oriented x 3. Psych:  Normal affect.  Assessment/Plan:  AORTIC VALVE DISORDERS Bicuspid aortic valve with moderate-severe AS and no associated thoracic aortic aneurysm now s/p bioprosthetic AVR.  The valve was well-seated on 6/15 echo.  Coronary artery disease  Status post PCI with DES to proximal LAD in 12/12 and again in 10/13 due to in-stent  restenosis.  He had recurrent in-stent restenosis of LAD stent in 4/15, now s/p LIMA-LAD.   - Continue ASA 81, ACEI, metoprolol, and statin.  - He can stop Plavix when he runs out of the current prescription.  HYPERLIPIDEMIA Good lipids in 8/15. Chronic diastolic CHF Travis Armstrong appears euvolemic now.  Only taking Lasix prn (rare use).   HTN BP starting to run higher, increase lisinopril to 10 mg daily.  Of note, BP runs higher in the left arm.   Loralie Champagne 09/24/2014

## 2014-09-25 ENCOUNTER — Encounter (HOSPITAL_COMMUNITY)
Admission: RE | Admit: 2014-09-25 | Discharge: 2014-09-25 | Disposition: A | Discharge status: Home / Self Care | Payer: Medicare Other | Source: Ambulatory Visit | Attending: Cardiology | Admitting: Cardiology

## 2014-09-25 DIAGNOSIS — Z951 Presence of aortocoronary bypass graft: Secondary | ICD-10-CM | POA: Diagnosis not present

## 2014-09-27 ENCOUNTER — Encounter (HOSPITAL_COMMUNITY)
Admission: RE | Admit: 2014-09-27 | Discharge: 2014-09-27 | Disposition: A | Discharge status: Home / Self Care | Payer: Medicare Other | Source: Ambulatory Visit | Attending: Cardiology | Admitting: Cardiology

## 2014-09-27 DIAGNOSIS — Z951 Presence of aortocoronary bypass graft: Secondary | ICD-10-CM | POA: Diagnosis not present

## 2014-09-30 ENCOUNTER — Encounter (HOSPITAL_COMMUNITY)
Admission: RE | Admit: 2014-09-30 | Discharge: 2014-09-30 | Disposition: A | Discharge status: Home / Self Care | Payer: Medicare Other | Source: Ambulatory Visit | Attending: Cardiology | Admitting: Cardiology

## 2014-09-30 DIAGNOSIS — Z951 Presence of aortocoronary bypass graft: Secondary | ICD-10-CM | POA: Diagnosis not present

## 2014-10-02 ENCOUNTER — Encounter (HOSPITAL_COMMUNITY)
Admission: RE | Admit: 2014-10-02 | Discharge: 2014-10-02 | Disposition: A | Discharge status: Home / Self Care | Payer: Medicare Other | Source: Ambulatory Visit | Attending: Cardiology | Admitting: Cardiology

## 2014-10-02 DIAGNOSIS — Z951 Presence of aortocoronary bypass graft: Secondary | ICD-10-CM | POA: Diagnosis not present

## 2014-10-04 ENCOUNTER — Encounter (HOSPITAL_COMMUNITY)
Admission: RE | Admit: 2014-10-04 | Discharge: 2014-10-04 | Disposition: A | Discharge status: Home / Self Care | Payer: Medicare Other | Source: Ambulatory Visit | Attending: Cardiology | Admitting: Cardiology

## 2014-10-04 DIAGNOSIS — Z951 Presence of aortocoronary bypass graft: Secondary | ICD-10-CM | POA: Diagnosis not present

## 2014-10-07 ENCOUNTER — Encounter (HOSPITAL_COMMUNITY)
Admission: RE | Admit: 2014-10-07 | Discharge: 2014-10-07 | Disposition: A | Discharge status: Home / Self Care | Payer: Medicare Other | Source: Ambulatory Visit | Attending: Cardiology | Admitting: Cardiology

## 2014-10-07 ENCOUNTER — Other Ambulatory Visit (INDEPENDENT_AMBULATORY_CARE_PROVIDER_SITE_OTHER): Payer: Medicare Other | Admitting: *Deleted

## 2014-10-07 DIAGNOSIS — I359 Nonrheumatic aortic valve disorder, unspecified: Secondary | ICD-10-CM

## 2014-10-07 DIAGNOSIS — I2510 Atherosclerotic heart disease of native coronary artery without angina pectoris: Secondary | ICD-10-CM

## 2014-10-07 DIAGNOSIS — Z951 Presence of aortocoronary bypass graft: Secondary | ICD-10-CM | POA: Diagnosis not present

## 2014-10-07 LAB — BASIC METABOLIC PANEL
BUN: 11 mg/dL (ref 6–23)
CALCIUM: 9.2 mg/dL (ref 8.4–10.5)
CO2: 29 mEq/L (ref 19–32)
CREATININE: 1.2 mg/dL (ref 0.4–1.5)
Chloride: 103 mEq/L (ref 96–112)
GFR: 66.17 mL/min (ref >60.00)
GLUCOSE: 89 mg/dL (ref 70–99)
Potassium: 4 mEq/L (ref 3.5–5.1)
Sodium: 138 mEq/L (ref 135–145)

## 2014-10-09 ENCOUNTER — Encounter (HOSPITAL_COMMUNITY)
Admission: RE | Admit: 2014-10-09 | Discharge: 2014-10-09 | Disposition: A | Discharge status: Home / Self Care | Payer: Medicare Other | Source: Ambulatory Visit | Attending: Cardiology | Admitting: Cardiology

## 2014-10-09 DIAGNOSIS — Z951 Presence of aortocoronary bypass graft: Secondary | ICD-10-CM | POA: Diagnosis not present

## 2014-10-11 ENCOUNTER — Encounter (HOSPITAL_COMMUNITY)
Admission: RE | Admit: 2014-10-11 | Discharge: 2014-10-11 | Disposition: A | Discharge status: Home / Self Care | Payer: Medicare Other | Source: Ambulatory Visit | Attending: Cardiology | Admitting: Cardiology

## 2014-10-11 DIAGNOSIS — Z951 Presence of aortocoronary bypass graft: Secondary | ICD-10-CM | POA: Diagnosis not present

## 2014-10-11 NOTE — Progress Notes (Addendum)
Pt graduates today with the completion of 36 exercise sessions in the cardiac rehab phase II Program.  Pt maintained excellent attendance over 13 week period.  Pt showed significant progress with exercise as evident by his increase in MET level from 3.3 to 4.0.  Pt feels he has met his short tem goal to gain strength.  Pt feels his legs are stronger than they were but not quite to the level pre surgery.  Pt struggled with the concept that extended time is needed for recovery from heart surgery. Pt admits that patience is hard for him! Pt's long term goal is to play golf and bowl.  Pt has returned to those activities comfortably with no complaints.  Pt plans to continue home exercise with walking and is considering joining a gym as the weather cools of.  Medication list reconciled. Pt reports compliance to his medication regimen. Repeat PHQ2 score 0 pt feels positive about his outlook and demonstrates an appropriate attitude.  It was a delight to work with this patient in Cardiac rehab.  Carlette Carlton RN, BSN 

## 2014-10-21 ENCOUNTER — Telehealth: Payer: Self-pay | Admitting: Cardiology

## 2014-10-21 NOTE — Telephone Encounter (Signed)
New message    Patient calling C/O bruise spot on chest.

## 2014-10-21 NOTE — Telephone Encounter (Signed)
Pt states he has a bruise on his left shoulder, he states it is about an 1-1/2", no swelling or drainage, and has changed from a dark black to a lighter red color.  Pt does not recall hitting or bumping his shoulder.  Pt concerned because he had left mammary artery harvested for bypass graft. I told pt I did not think  bruise was related to using mammary artery for bypass graft but if he had any concerns he should call Dr Roxy Manns. I advised pt to call back if bruise changed or did not continue to improve.

## 2014-11-23 ENCOUNTER — Emergency Department (HOSPITAL_COMMUNITY)
Admission: EM | Admit: 2014-11-23 | Discharge: 2014-11-23 | Disposition: A | Discharge status: Home / Self Care | Payer: Medicare Other | Attending: Emergency Medicine | Admitting: Emergency Medicine

## 2014-11-23 ENCOUNTER — Encounter (HOSPITAL_COMMUNITY): Payer: Self-pay | Admitting: Emergency Medicine

## 2014-11-23 DIAGNOSIS — Z880 Allergy status to penicillin: Secondary | ICD-10-CM | POA: Insufficient documentation

## 2014-11-23 DIAGNOSIS — Z7982 Long term (current) use of aspirin: Secondary | ICD-10-CM | POA: Diagnosis not present

## 2014-11-23 DIAGNOSIS — Z951 Presence of aortocoronary bypass graft: Secondary | ICD-10-CM | POA: Insufficient documentation

## 2014-11-23 DIAGNOSIS — Z85828 Personal history of other malignant neoplasm of skin: Secondary | ICD-10-CM | POA: Insufficient documentation

## 2014-11-23 DIAGNOSIS — Y998 Other external cause status: Secondary | ICD-10-CM | POA: Insufficient documentation

## 2014-11-23 DIAGNOSIS — E785 Hyperlipidemia, unspecified: Secondary | ICD-10-CM | POA: Insufficient documentation

## 2014-11-23 DIAGNOSIS — Y838 Other surgical procedures as the cause of abnormal reaction of the patient, or of later complication, without mention of misadventure at the time of the procedure: Secondary | ICD-10-CM | POA: Insufficient documentation

## 2014-11-23 DIAGNOSIS — Z7902 Long term (current) use of antithrombotics/antiplatelets: Secondary | ICD-10-CM | POA: Insufficient documentation

## 2014-11-23 DIAGNOSIS — Y9289 Other specified places as the place of occurrence of the external cause: Secondary | ICD-10-CM | POA: Insufficient documentation

## 2014-11-23 DIAGNOSIS — Z79899 Other long term (current) drug therapy: Secondary | ICD-10-CM | POA: Diagnosis not present

## 2014-11-23 DIAGNOSIS — T148XXA Other injury of unspecified body region, initial encounter: Secondary | ICD-10-CM

## 2014-11-23 DIAGNOSIS — M1990 Unspecified osteoarthritis, unspecified site: Secondary | ICD-10-CM | POA: Insufficient documentation

## 2014-11-23 DIAGNOSIS — Y9389 Activity, other specified: Secondary | ICD-10-CM | POA: Diagnosis not present

## 2014-11-23 DIAGNOSIS — X58XXXA Exposure to other specified factors, initial encounter: Secondary | ICD-10-CM | POA: Insufficient documentation

## 2014-11-23 DIAGNOSIS — Z87891 Personal history of nicotine dependence: Secondary | ICD-10-CM | POA: Insufficient documentation

## 2014-11-23 DIAGNOSIS — I10 Essential (primary) hypertension: Secondary | ICD-10-CM | POA: Insufficient documentation

## 2014-11-23 DIAGNOSIS — I2510 Atherosclerotic heart disease of native coronary artery without angina pectoris: Secondary | ICD-10-CM | POA: Diagnosis not present

## 2014-11-23 DIAGNOSIS — Z87828 Personal history of other (healed) physical injury and trauma: Secondary | ICD-10-CM | POA: Insufficient documentation

## 2014-11-23 DIAGNOSIS — S0181XA Laceration without foreign body of other part of head, initial encounter: Secondary | ICD-10-CM | POA: Diagnosis not present

## 2014-11-23 DIAGNOSIS — Z8701 Personal history of pneumonia (recurrent): Secondary | ICD-10-CM | POA: Diagnosis not present

## 2014-11-23 DIAGNOSIS — L7622 Postprocedural hemorrhage and hematoma of skin and subcutaneous tissue following other procedure: Secondary | ICD-10-CM | POA: Insufficient documentation

## 2014-11-23 DIAGNOSIS — Z9861 Coronary angioplasty status: Secondary | ICD-10-CM | POA: Diagnosis not present

## 2014-11-23 MED ORDER — LIDOCAINE-EPINEPHRINE-TETRACAINE (LET) SOLUTION
3.0000 mL | Freq: Once | NASAL | Status: DC
Start: 2014-11-23 — End: 2014-11-23

## 2014-11-23 NOTE — ED Provider Notes (Signed)
CSN: [id number]     Arrival date & time 11/23/14  0033 History   First MD Initiated Contact with Patient 11/23/14 0155     Chief Complaint  Patient presents with  . Laceration     (Consider location/radiation/quality/duration/timing/severity/associated sxs/prior Treatment) HPI Mr. Yossef Gilkison is a 73 year old male with past medical history of hypertension, CAD, hyperlipidemia, on aspirin and Plavix who presents the ER complaining of steady bleeding from surgical site. Patient states he had carcinoma of skin surgically removed yesterday. Patient states this evening he used an ointment with a Q-tip, and noted steady bleeding after application of the ointment. Patient states he was unable to get the bleeding to stop and came to the emergency room. Patient denies trauma to the area, purulent discharge, erythema, warmth, fever, nausea, vomiting.  Past Medical History  Diagnosis Date  . Keratosis, actinic   . Muscle strain   . HTN (hypertension)   . CAD (coronary artery disease)     s/p CABG 04/2014 - LIMA to LAD  . Hyperlipidemia   . History of pneumonia 1958  . Bradycardia, sinus   . Aortic stenosis     a. bicuspid aortic valve;  b.  s/p AVR 04/2014 - 25 mm [facility] Ease bovine pericardial tissue valve  . Anginal pain     has not taken nitro  . Arthritis     back  . History of skin cancer   . Hx of echocardiogram     a. Echo (05/2014):  Mild focal basal septal hypertrophy, EF 55-60%, no RWMA, Gr 1 DD, AVR ok, MAC, mild LAE, mild RVE, mild RAE.   Past Surgical History  Procedure Laterality Date  . Coronary angioplasty with stent placement  11/23/2011    DES mid LAD  . Coronary angioplasty with stent placement  10/02/2012    DES prox LAD  . Inguinal hernia repair  ~ 2007    left  . Tee without cardioversion N/A 01/15/2014    Procedure: TRANSESOPHAGEAL ECHOCARDIOGRAM (TEE);  Surgeon: Larey Dresser, MD;  Location: [facility];  Service: Cardiovascular;  Laterality: N/A;  . Aortic  valve replacement N/A 05/03/2014    Procedure: AORTIC VALVE REPLACEMENT (AVR);  Surgeon: Rexene Alberts, MD;  Location: [place];  Service: Open Heart Surgery;  Laterality: N/A;  . Coronary artery bypass graft N/A 05/03/2014    Procedure: CORONARY ARTERY BYPASS GRAFTING (CABG);  Surgeon: Rexene Alberts, MD;  Location: [facility];  Service: Open Heart Surgery;  Laterality: N/A;  Times 1 using left internal mammary artery.  . Intraoperative transesophageal echocardiogram N/A 05/03/2014    Procedure: INTRAOPERATIVE TRANSESOPHAGEAL ECHOCARDIOGRAM;  Surgeon: Rexene Alberts, MD;  Location: [place];  Service: Open Heart Surgery;  Laterality: N/A;   Family History  Problem Relation Age of Onset  . Coronary artery disease Brother   . Colon cancer Neg Hx   . Esophageal cancer Neg Hx   . Stomach cancer Neg Hx   . Rectal cancer Neg Hx    History  Substance Use Topics  . Smoking status: Former Smoker -- 1.00 packs/day for 8 years    Types: Cigarettes    Quit date: 03/28/1972  . Smokeless tobacco: Never Used  . Alcohol Use: No     Comment: "last alcohol was ~ 1990's"    Review of Systems  Skin: Positive for wound.  Neurological: Negative for numbness.      Allergies  Penicillins  Home Medications   Prior to Admission medications  Medication Sig Start Date End Date Taking? Authorizing Provider  aspirin 81 MG tablet Take 81 mg by mouth daily.    Yes Historical Provider, MD  atorvastatin (LIPITOR) 80 MG tablet Take 40 mg by mouth daily.   Yes Historical Provider, MD  calcium carbonate (OS-CAL) 600 MG TABS tablet Take 600 mg by mouth daily with breakfast.   Yes Historical Provider, MD  clopidogrel (PLAVIX) 75 MG tablet Take 75 mg by mouth daily with breakfast.   Yes Historical Provider, MD  ipratropium (ATROVENT) 0.03 % nasal spray Place 1 spray into both nostrils 2 (two) times daily.   Yes Historical Provider, MD  lisinopril (PRINIVIL,ZESTRIL) 10 MG tablet Take 1 tablet (10 mg total) by mouth  daily. 09/23/14  Yes Larey Dresser, MD  loratadine (CLARITIN) 10 MG tablet Take 10 mg by mouth daily.    Yes Historical Provider, MD  magnesium gluconate (MAGONATE) 500 MG tablet Take 500 mg by mouth daily.    Yes Historical Provider, MD  metoprolol tartrate (LOPRESSOR) 25 MG tablet Take 12.5 mg by mouth 2 (two) times daily.    Yes Historical Provider, MD  Multiple Vitamins-Minerals (PRESERVISION AREDS 2) CAPS Take 2 capsules by mouth daily.   Yes Historical Provider, MD  nitroGLYCERIN (NITROSTAT) 0.4 MG SL tablet Place 1 tablet (0.4 mg total) under the tongue every 5 (five) minutes as needed for chest pain. 09/23/14  Yes Larey Dresser, MD  Omega-3 Fatty Acids (FISH OIL PO) Take 1 capsule by mouth daily.    Yes Historical Provider, MD   BP 147/86 mmHg  Pulse 60  Temp(Src) 97.8 F (36.6 C) (Oral)  Resp 18  SpO2 100% Physical Exam  Constitutional: He is oriented to person, place, and time. He appears well-developed and well-nourished. No distress.  HENT:  Head: Normocephalic and atraumatic.  Eyes: Right eye exhibits no discharge. Left eye exhibits no discharge. No scleral icterus.  Neck: Normal range of motion.  Pulmonary/Chest: Effort normal. No respiratory distress.  Musculoskeletal: Normal range of motion.  Neurological: He is alert and oriented to person, place, and time.  Skin: Skin is warm and dry. He is not diaphoretic.  2-3 cm surgical wound noted to left temporal region. Well placed Vicryl sutures noted in running subcuticular pattern. Wound edges are well approximated, there is a mild, steady bright red discharge. No purulent discharge, swelling,   Psychiatric: He has a normal mood and affect.  Nursing note and vitals reviewed.   ED Course  LACERATION REPAIR Date/Time: 11/23/2014 8:21 AM Performed by: Carrie Mew Authorized by: Carrie Mew Consent: Verbal consent obtained. Risks and benefits: risks, benefits and alternatives were discussed Consent given by:  patient Patient identity confirmed: verbally with patient Body area: head/neck Location details: forehead Laceration length: 3 cm Foreign bodies: no foreign bodies Tendon involvement: none Nerve involvement: none Vascular damage: no Anesthesia: local infiltration Local anesthetic: LET (lido,epi,tetracaine) Anesthetic total: 3 ml Patient sedated: no Preparation: Patient was prepped and draped in the usual sterile fashion. Number of sutures: 1 Comments: One, simple interrupted Vicryl suture placed an area where patient's bleeding was coming from his wound.   (including critical care time) Labs Review Labs Reviewed - No data to display  Imaging Review No results found.   EKG Interpretation None      MDM   Final diagnoses:  Bleeding from wound   Patient with slow, losing bleed from surgical wound. Initially I attempted to control bleeding with placing Steri-Strips over the one area  that was oozing blood. This did not help patient's bleed. We applied direct pressure to the one area that is bleeding, and this seemed to stop the bleed. Since patient's wound was bleeding for several hours, to avoid bleeding again, I'll place another Vicryl suture in the area from where he was bleeding out of his wound. Surgi-seal gauze placed over the wound and pressure dressing applied. These measures effectively controlled patient's bleeding, and patient discharged. I discussed return precautions with patient and strongly encouraged him to follow-up with the surgeon who performed the procedure along with his primary care physician. Patient was agreeable to this plan. I encouraged patient to call or return to the ER should he have any questions or concerns.  BP 147/86 mmHg  Pulse 60  Temp(Src) 97.8 F (36.6 C) (Oral)  Resp 18  SpO2 100%  Signed,  Dahlia Bailiff, PA-C 8:23 AM  Patient seen and discussed with Dr. Rolland Porter, M.D.  Carrie Mew, PA-C 11/23/14 [place], MD 11/23/14  [phone]

## 2014-11-23 NOTE — ED Notes (Addendum)
Pt arrived to the ED with a complaint of bleeding for a stitched laceration.  Pt states that he had a carcinoma removed yesterday and the wound was stitched.  Pt was bleeding slightly this evening when he applied salve with a Q tip and it began bleeding more.  Pt does take blood thinning medications

## 2014-11-23 NOTE — ED Provider Notes (Signed)
Patient had a lesion removed from his left forehead at the [facility] in [place] 2 days ago. He states this evening when he was putting ointment that was prescribed on the wound it started bleeding would not stop. Patient does take aspirin and Plavix.  Patient is noted to have a surgical laceration in his left forehead with sutures in place. There is one area with active, slow but steady bleeding just distal to the center of the laceration. I help constant pressure with my thumb for approximately 1 minute and the bleeding stopped. Please see photo.      Medical screening examination/treatment/procedure(s) were conducted as a shared visit with non-physician practitioner(s) and myself.  I personally evaluated the patient during the encounter.   EKG Interpretation None      Rolland Porter, MD, Abram Sander     Janice Norrie, MD 11/23/14 [phone]

## 2014-11-23 NOTE — Discharge Instructions (Signed)
Follow up with the surgeon who did your procedure. Follow-up with your primary care doctor. Return to the ER if your symptoms worsen or if he notice any purulent discharge, severe pain, fever, nausea, vomiting.  Incision Care An incision is when a surgeon cuts into your body tissues. After surgery, the incision needs to be cared for properly to prevent infection.  HOME CARE INSTRUCTIONS   Take all medicine as directed by your caregiver. Only take over-the-counter or prescription medicines for pain, discomfort, or fever as directed by your caregiver.  Do not remove your bandage (dressing) or get your incision wet until your surgeon gives you permission. In the event that your dressing becomes wet, dirty, or starts to smell, change the dressing and call your surgeon for instructions as soon as possible.  Take showers. Do not take tub baths, swim, or do anything that may soak the wound until it is healed.  Resume your normal diet and activities as directed or allowed.  Avoid lifting any weight until you are instructed otherwise.  Use anti-itch antihistamine medicine as directed by your caregiver. The wound may itch when it is healing. Do not pick or scratch at the wound.  Follow up with your caregiver for stitch (suture) or staple removal as directed.  Drink enough fluids to keep your urine clear or pale yellow. SEEK MEDICAL CARE IF:   You have redness, swelling, or increasing pain in the wound that is not controlled with medicine.  You have drainage, blood, or pus coming from the wound that lasts longer than 1 day.  You develop muscle aches, chills, or a general ill feeling.  You notice a bad smell coming from the wound or dressing.  Your wound edges separate after the sutures, staples, or skin adhesive strips have been removed.  You develop persistent nausea or vomiting. SEEK IMMEDIATE MEDICAL CARE IF:   You have a fever.  You develop a rash.  You develop dizzy episodes or  faint while standing.  You have difficulty breathing.  You develop any reaction or side effects to medicine given. MAKE SURE YOU:   Understand these instructions.  Will watch your condition.  Will get help right away if you are not doing well or get worse. Document Released: 06/25/2005 Document Revised: 02/28/2012 Document Reviewed: 01/30/2014 [facility] Patient Information 2015 [place], [place]. This information is not intended to replace advice given to you by your health care provider. Make sure you discuss any questions you have with your health care provider.

## 2014-11-27 ENCOUNTER — Encounter (HOSPITAL_COMMUNITY): Payer: Self-pay | Admitting: Cardiology

## 2014-11-28 ENCOUNTER — Encounter (HOSPITAL_COMMUNITY): Payer: Self-pay | Admitting: Cardiology

## 2014-12-06 ENCOUNTER — Telehealth: Payer: Self-pay | Admitting: Cardiology

## 2014-12-06 NOTE — Telephone Encounter (Signed)
Pt advised. Pt states he has an appt with his PCP this afternoon.

## 2014-12-06 NOTE — Telephone Encounter (Signed)
Pt states he played golf on Wednesday, Thursday he developed a dry non productive cough. He states he does not have any lower extremity edema and denies increase in SOB.

## 2014-12-06 NOTE — Telephone Encounter (Signed)
New Message      Patient would like for you to give him a call he is having a bad cough and want to know what meds to take for it because he  Had bypass surgery and just want to make sure he gets the correct medication.

## 2014-12-06 NOTE — Telephone Encounter (Signed)
Pt states he does take loratadine regularly for allergies. Pt feels he has congestion in his chest and head. Pt states he does not have a fever. Pt asking for medication recommendations to help his chest and head congestion.  I will forward to Dr Aundra Dubin for review.

## 2014-12-06 NOTE — Telephone Encounter (Signed)
Try Coricidin for congestion

## 2014-12-09 ENCOUNTER — Telehealth: Payer: Self-pay | Admitting: Cardiology

## 2014-12-09 NOTE — Telephone Encounter (Signed)
Pt states he was given prednisone for 5 days and azithromycin for sinus infection Friday by his PCP.  Pt asking if OK to take with other cardiac medications, including lasix and KCL that he takes prn. I will forward to Dr Aundra Dubin for review.

## 2014-12-09 NOTE — Telephone Encounter (Signed)
New problem    Pt went to pcp doctor and was put on Prednisone 10mg  and Azithromycin 250mg  for his sinus. Pt need to know if he can take it with his other medication.

## 2014-12-09 NOTE — Telephone Encounter (Signed)
Ok for short term treatment.

## 2014-12-09 NOTE — Telephone Encounter (Signed)
Pt advised.

## 2015-01-23 ENCOUNTER — Encounter: Payer: Self-pay | Admitting: Cardiology

## 2015-01-23 ENCOUNTER — Ambulatory Visit (INDEPENDENT_AMBULATORY_CARE_PROVIDER_SITE_OTHER): Payer: PPO | Admitting: Cardiology

## 2015-01-23 VITALS — BP 124/72 | HR 61 | Ht 71.0 in | Wt 196.0 lb

## 2015-01-23 DIAGNOSIS — I2510 Atherosclerotic heart disease of native coronary artery without angina pectoris: Secondary | ICD-10-CM

## 2015-01-23 DIAGNOSIS — Z953 Presence of xenogenic heart valve: Secondary | ICD-10-CM

## 2015-01-23 DIAGNOSIS — Z954 Presence of other heart-valve replacement: Secondary | ICD-10-CM

## 2015-01-23 DIAGNOSIS — Z951 Presence of aortocoronary bypass graft: Secondary | ICD-10-CM

## 2015-01-23 MED ORDER — VARDENAFIL HCL 10 MG PO TABS: ORAL_TABLET | ORAL | Status: DC

## 2015-01-23 NOTE — Patient Instructions (Signed)
Your physician wants you to follow-up in: 6 months with Dr Aundra Dubin. (August 2016). You will receive a reminder letter in the mail two months in advance. If you don't receive a letter, please call our office to schedule the follow-up appointment.   DO NOT TAKE NITROGLYCERIN WITHIN 24 HOURS OF TAKING LEVITRA.

## 2015-01-24 NOTE — Progress Notes (Signed)
Patient ID: Travis Armstrong, male   DOB: [birth year], 74 y.o.   MRN: [id number] PCP: Dr. Redmond Pulling  74 yo with history of HTN presented initially for evaluation of chest pain in 2011.  It occurred with splitting wood and after walking about 1/4 mile on flat ground.  Given these exertional symptoms, I set him up for an ETT-myoview. This showed evidence for ischemia by exercise ECG, and there was a small reversible apical perfusion defect possibly suggestive of ischemia as well.  Echo to assess for cause of systolic murmur showed a bicuspid aortic valve with preserved LV systolic function and no significant aortic stenosis.  I set him up for a left heart cath in 1/12, which showed heavy calcification in the LAD.  There was a long 90% mid LAD stenosis.  The LAD was small at this point, a 2-2.5 mm vessel.  I decided to proceed with medical treatment as his symptoms were only with moderate exertion and as the LAD was a small caliber vessel at the site of the stenosis.  Given the bicuspid aortic valve, I did an Travis angiogram of the thoracic aorta, which ruled out aortic aneurysm.  In the fall of 2012, he had recurrent chest pain and I repeated a cath, this showed worsening of the mid LAD stenosis up to 95%.  He underwent PCI with Promus DES to the mid LAD in 12/12.  In the fall of 2013, he again developed exertional chest pain.  I did a left heart cath in 10/13 showing 99% stenosis in the proximal LAD just proximal to the stent.  He had a Promus DES placed to the proximal LAD.  In 1/15, patient had echo concerning for moderate-severe AS (progression).  TEE was done to assess more closely, this was more consistent with moderate AS. Patient again developed exertional chest pain in 4/15 and had LHC showing 80% mLAD in-stent restenosis.  Echo was done again in 4/15, suggestive of moderate to severe AS.  Patient was then taken for LIMA-LAD with bioprosthetic AVR by Dr. Roxy Manns in 5/15.  Post-op echo in 6/15 showed normal EF and  well-seated bioprosthetic aortic valve.   Patient has done well since getting home from surgery.  No significant chest pain.  He is mildly short of breath walking up a hill.  No problems on flat ground.  He finished cardiac rehab.  No orthopnea or PND.  He is back to bowling and golfing.  He walks 2-3 miles daily.   Labs (10/11): LDL 113, HDL 46, K 5.1, creatinine 1.1, LFTs normal Labs (1/12): K 5.3, creatinine 1.1 Labs (3/12): LDL 51, HDL 37 Labs (12/12): K 4.1, creatinine 0.98 Labs (4/13): LDL 53, HDL 45 Labs (9/13): LDL 54, HDL 46, LFTs normal, TSH normal, K 4.7, creatinine 0.9 Labs (3/14): LDL 36, HDL 38 Labs (10/14): K 4.5, creatinine 1.1, AST 40, ALT 42, LDL 27, HDL 44 Labs (6/15): K 4, creatinine 1.1, HCT 55 Labs (8/15): K 4.9, creatinine 1.2, LDL 32 Labs (10/15): K 4, creatinine 1.2  ECG: NSR, iRBBB  Allergies (verified):  1)  ! Pcn  Past Medical History: 1. Actinic Keratosis 2. Muscle strains  3. HTN 4. CAD: Anginal-type chest pain.  ETT-myoview (1/12) showed good exercise tolerance but also inferior and V5/V6 1 mm ST depression and chest pain with exercise.  Perfusion images showed reversible apical perfusion defect.  Findings suggested ischemia.  LHC (1/12): LAD heavily calcified with 90% calcified mid LAD stenosis after D2.  The LAD was  small caliber (2-2.5 mm) at this point.  Because of this and his lack of unstable symptoms, I elected to manage him medically initially.  ETT-myoview (11/12): 9'30" exercise with mild chest pain and ischemic ST changes.  There was a small reversible apical perfusion defect, similar to the 1/12 study.  LHC repeated 12/12 with 95% mid LAD stenosis after D2.  Promus DES to mid LAD.  Exertional chest pain recurred with LHC done again in 10/13.  There was 99% proximal LAD stenosis just proximal to the stent.  Patient received a Promus DES.   LHC (4/15) with 80% mLAD in-stent restenosis and small to moderate D2 with 90% proximal stenosis.  Patient had  LIMA-LAD along with AVR in 5/15.  5. Bicuspid aortic valve with mild-moderate AS: Echo (1/12) showed EF 60-65%, bicuspid aortic valve with minimal stenosis (mean gradient 9 mmHg), grade II diastolic dysfunction, mild Travis, mild LAE.  Travis angiogram chest (12/12): No thoracic aortic aneurysm.  Echo (1/14) showed EF 65-70% with mild to moderate AS, mean gradient 22 mmHg.  Echo (1/15) with moderate to severe AS, mean gradient 36, peak 55, AVA 1.0 cm^2.  TEE (1/15) with EF 60-65%, bicuspid aortic valve, AVA 1.0 cm^2 with mean gradient 26 mmHg, peak 48 mmHg, moderate Travis, RV normal.  Echo (4/15) with EF 60-65%, moderate LVH, moderate to severe AS with mean gradient 35 mmHg and AVA 0.7 cm^2.  Patient had bioprosthetic AVR in 5/15.  Echo post-op in 6/15 with EF 55-60%, normal bioprosthetic aortic valve.  6. Carotid dopplers (1/12): No significant stenosis.  Carotid dopplers (10/14) with mild bilateral ICA stenosis.  7.  Hyperlipidemia: Myalgias with Lipitor.  8.  Mild bradycardia  Family History: 2 brothers with CAD diagnosed in their 57s.  Sister with diabetes, died of complications from this disease.   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    Current Outpatient Prescriptions  Medication Sig Dispense Refill  . aspirin 81 MG tablet Take 81 mg by mouth daily.     Marland Kitchen atorvastatin (LIPITOR) 80 MG tablet Take 40 mg by mouth daily.    . calcium carbonate (OS-CAL) 600 MG TABS tablet Take 600 mg by mouth daily with breakfast.    . clopidogrel (PLAVIX) 75 MG tablet Take 75 mg by mouth daily with breakfast.    . ipratropium (ATROVENT) 0.03 % nasal spray Place 1 spray into both nostrils 2 (two) times daily.    Marland Kitchen lisinopril (PRINIVIL,ZESTRIL) 10 MG tablet Take 1 tablet (10 mg total) by mouth daily. 90 tablet 3  . loratadine  (CLARITIN) 10 MG tablet Take 10 mg by mouth daily.     . magnesium gluconate (MAGONATE) 500 MG tablet Take 500 mg by mouth daily.     . metoprolol tartrate (LOPRESSOR) 25 MG tablet Take 12.5 mg by mouth 2 (two) times daily.     . Multiple Vitamins-Minerals (PRESERVISION AREDS 2) CAPS Take 2 capsules by mouth daily.    . nitroGLYCERIN (NITROSTAT) 0.4 MG SL tablet Place 1 tablet (0.4 mg total) under the tongue every 5 (five) minutes as needed for chest pain. 90 tablet 3  . Omega-3 Fatty Acids (FISH OIL PO) Take 1 capsule by mouth daily.     . vardenafil (LEVITRA) 10 MG tablet DO NOT TAKE NITROGLYCERIN WITH 24  HOURS OF  USING LEVITRA 10 tablet 0   No current facility-administered medications for this visit.    BP 124/72 mmHg  Pulse 61  Ht 5\' 11"  (1.803 m)  Wt 196 lb (88.905 kg)  BMI 27.35 kg/m2 General:  Well developed, well nourished, in no acute distress. Neck:  Neck supple, JVP 7 cm. No masses, thyromegaly or abnormal cervical nodes. Lungs:  Slight decreased breath sounds right base.  Heart:  Non-displaced PMI, chest non-tender; regular rate and rhythm, S1, S2.  2/6 early SEM. +S4. Carotid upstroke normal.  Pedals normal pulses. Trace ankle edema.  Right radial pulse weaker than left radial.  Abdomen:  Bowel sounds positive; abdomen soft and non-tender without masses, organomegaly, or hernias noted. No hepatosplenomegaly. Extremities:  No clubbing or cyanosis. Neurologic:  Alert and oriented x 3. Psych:  Normal affect.  Assessment/Plan:  AORTIC VALVE DISORDERS Bicuspid aortic valve with moderate-severe AS and no associated thoracic aortic aneurysm now s/p bioprosthetic AVR.  The valve was well-seated on 6/15 echo.  Coronary artery disease  Status post PCI with DES to proximal LAD in 12/12 and again in 10/13 due to in-stent restenosis.  He had recurrent in-stent restenosis of LAD stent in 4/15, now s/p LIMA-LAD.   - Continue ASA 81, ACEI, metoprolol, and statin.  - He can stop Plavix  when he runs out of the current prescription (had told him this before but he kept going on it).  HYPERLIPIDEMIA Good lipids in 8/15. Chronic diastolic CHF Travis Armstrong appears euvolemic now.  Only taking Lasix prn (rare use).   HTN BP controlled. Erectile dysfunction OK to use Levitra but warned about concomitant use of NTG.   Loralie Champagne 01/24/2015

## 2015-01-28 DIAGNOSIS — C44329 Squamous cell carcinoma of skin of other parts of face: Secondary | ICD-10-CM | POA: Insufficient documentation

## 2015-01-28 DIAGNOSIS — C44320 Squamous cell carcinoma of skin of unspecified parts of face: Secondary | ICD-10-CM | POA: Insufficient documentation

## 2015-02-14 IMAGING — CR DG CHEST 2V
2 series · 2 of 2 positions shown · non-contrast
Comparison: Prior chest x-ray 05/06/2014

CLINICAL DATA: CABG and aortic valve replacement

EXAM:
CHEST  2 VIEW

[w chest pa]
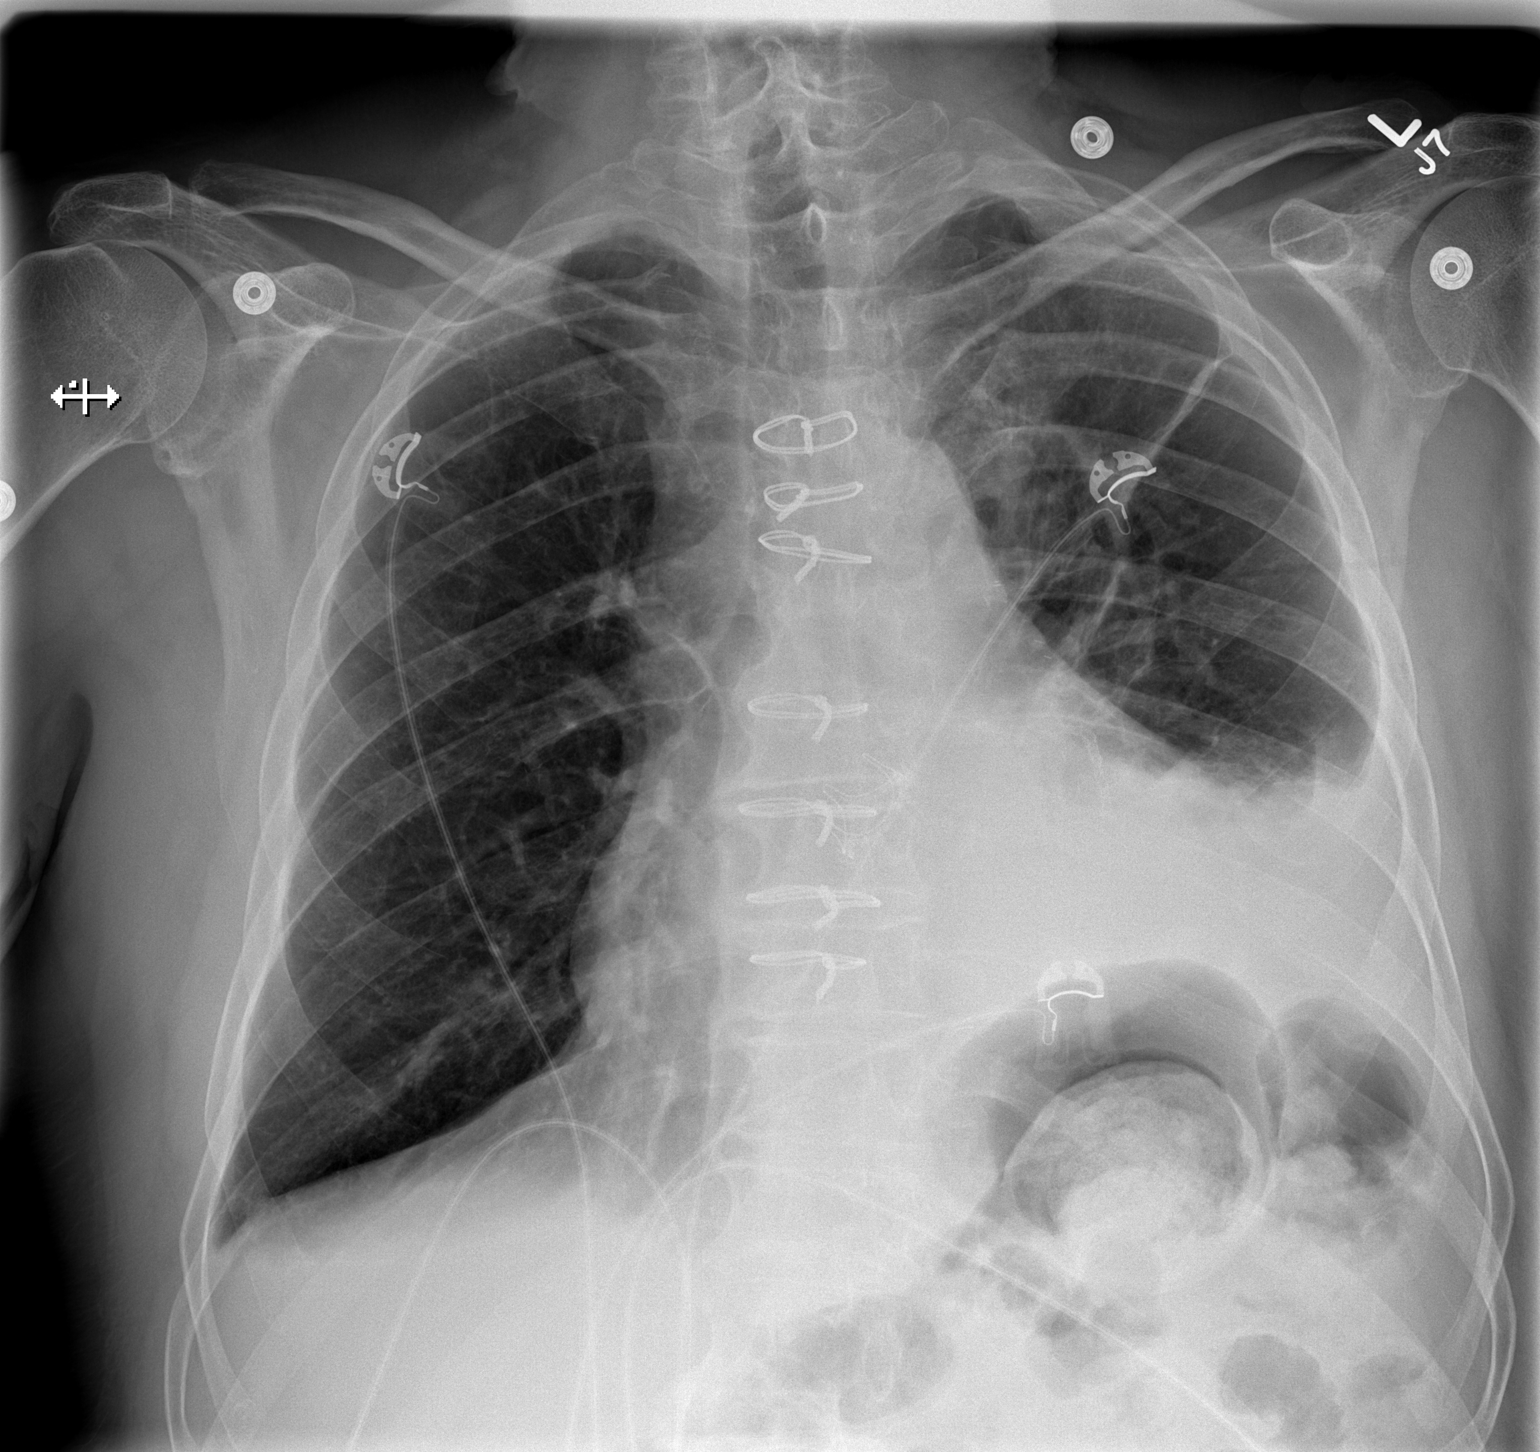

[w chest lat]
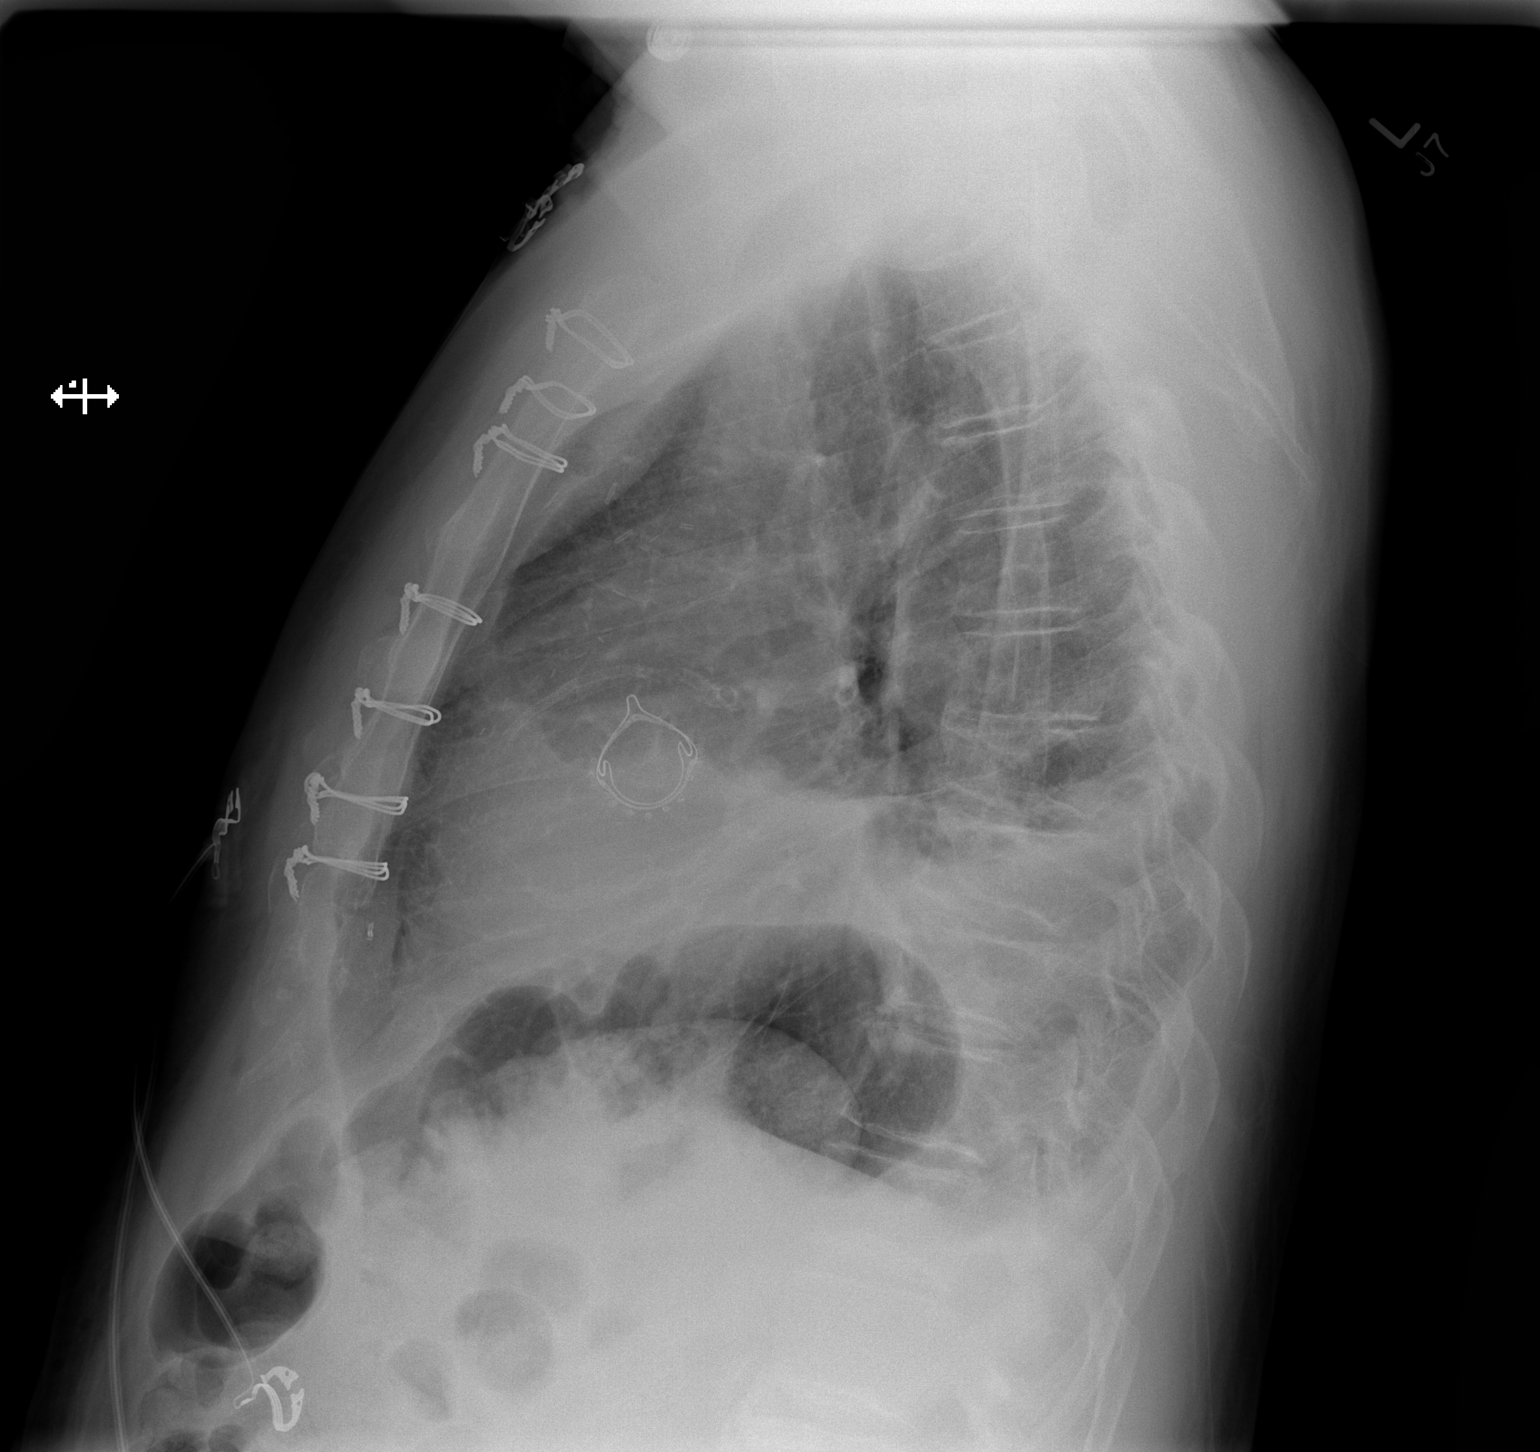

[2 of 2 positions shown; findings below may reference images not displayed]

FINDINGS: Stable cardiac and mediastinal contours. Atherosclerotic
calcification in the transverse aorta. Resolved left apical
pneumothorax. Persistent elevation of the left hemidiaphragm with
small left effusion and associated basilar atelectasis. Overall,
aeration of the left upper lung has improved. The right lung remains
clear. Patient is status post median sternotomy with evidence of
aortic valve repair. No acute osseous abnormality.
IMPRESSION: 1. Resolution of tiny left apical pneumothorax.
2. Improving inspiratory volumes.
3. Persistent elevation of the left hemidiaphragm, small left
effusion and associated left basilar atelectasis.

## 2015-02-24 IMAGING — CR DG CHEST 1V
1 series · 1 of 1 positions shown · non-contrast
Comparison: Ultrasound 05/17/2014 .

CLINICAL DATA: Thoracentesis.

EXAM:
CHEST - 1 VIEW

[w chest pa]
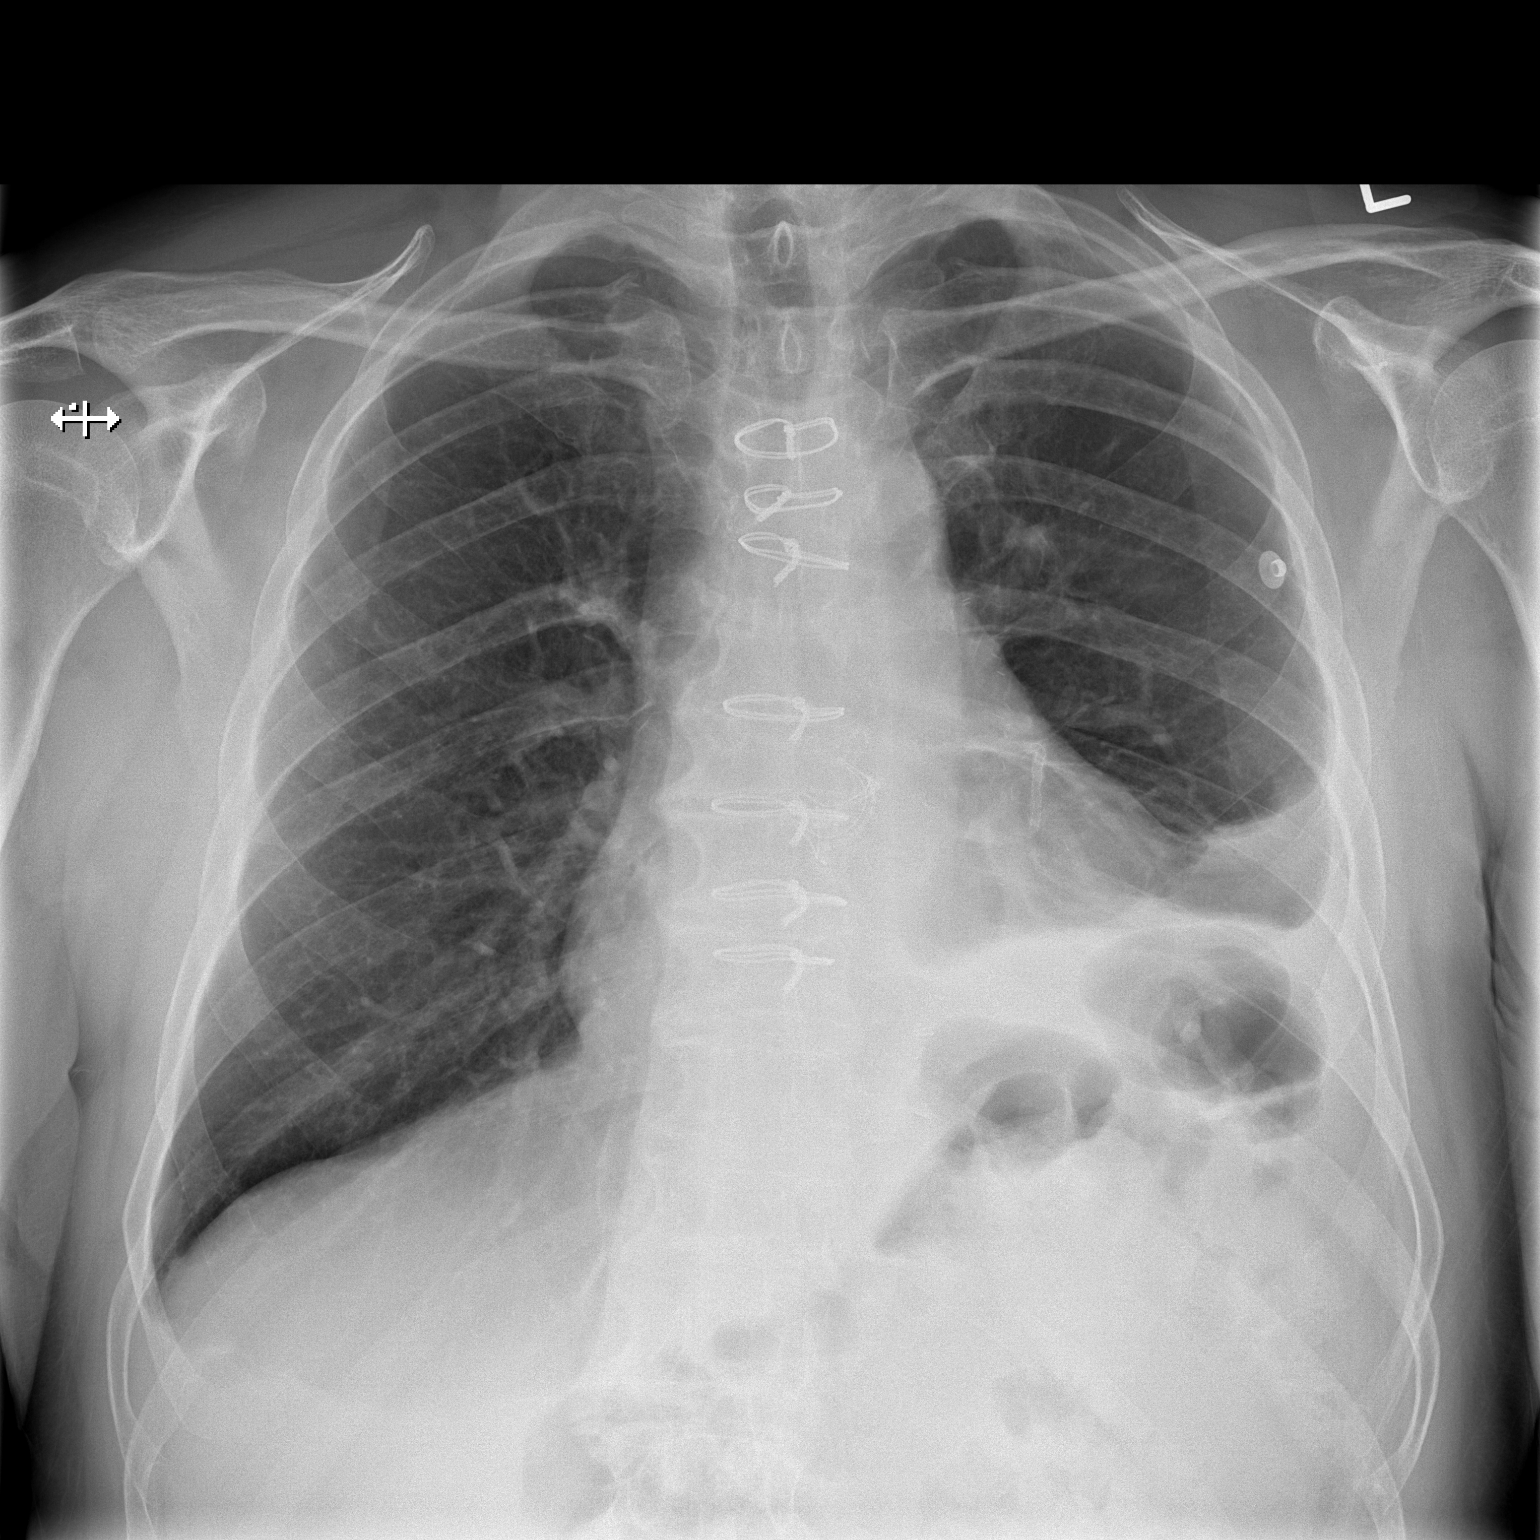

[1 of 1 positions shown; findings below may reference images not displayed]

FINDINGS: Mediastinum and hilar structures normal. Prior aortic valve
replacement. Prior median sternotomy. Small left pleural effusion.
No evidence of pneumothorax post thoracentesis. Right lung is clear.
IMPRESSION: No evidence of pneumothorax post left thoracentesis.

## 2015-04-28 DIAGNOSIS — M5416 Radiculopathy, lumbar region: Secondary | ICD-10-CM | POA: Insufficient documentation

## 2015-04-28 DIAGNOSIS — M4317 Spondylolisthesis, lumbosacral region: Secondary | ICD-10-CM | POA: Insufficient documentation

## 2015-05-12 ENCOUNTER — Encounter: Payer: Self-pay | Admitting: *Deleted

## 2015-05-12 ENCOUNTER — Ambulatory Visit (INDEPENDENT_AMBULATORY_CARE_PROVIDER_SITE_OTHER): Payer: PPO | Admitting: Thoracic Surgery (Cardiothoracic Vascular Surgery)

## 2015-05-12 ENCOUNTER — Encounter: Payer: Self-pay | Admitting: Thoracic Surgery (Cardiothoracic Vascular Surgery)

## 2015-05-12 VITALS — BP 139/71 | HR 54 | Resp 16 | Ht 71.0 in | Wt 192.0 lb

## 2015-05-12 DIAGNOSIS — Z953 Presence of xenogenic heart valve: Secondary | ICD-10-CM

## 2015-05-12 DIAGNOSIS — I25118 Atherosclerotic heart disease of native coronary artery with other forms of angina pectoris: Secondary | ICD-10-CM | POA: Diagnosis not present

## 2015-05-12 DIAGNOSIS — Q231 Congenital insufficiency of aortic valve: Secondary | ICD-10-CM

## 2015-05-12 DIAGNOSIS — Z952 Presence of prosthetic heart valve: Secondary | ICD-10-CM

## 2015-05-12 DIAGNOSIS — I350 Nonrheumatic aortic (valve) stenosis: Secondary | ICD-10-CM | POA: Diagnosis not present

## 2015-05-12 DIAGNOSIS — Z954 Presence of other heart-valve replacement: Secondary | ICD-10-CM | POA: Diagnosis not present

## 2015-05-12 DIAGNOSIS — Z951 Presence of aortocoronary bypass graft: Secondary | ICD-10-CM

## 2015-05-12 NOTE — Patient Instructions (Signed)
  Endocarditis is a potentially serious infection of heart valves or inside lining of the heart.  It occurs more commonly in patients with diseased heart valves (such as patient's with aortic or mitral valve disease) and in patients who have undergone heart valve repair or replacement.  Certain surgical and dental procedures may put you at risk, such as dental cleaning, other dental procedures, or any surgery involving the respiratory, urinary, gastrointestinal tract, gallbladder or prostate gland.   To minimize your chances for develooping endocarditis, maintain good oral health and seek prompt medical attention for any infections involving the mouth, teeth, gums, skin or urinary tract.  Always notify your doctor or dentist about your underlying heart valve condition before having any invasive procedures. You will need to take antibiotics before certain procedures.    The patient has been reminded of the long term benefits of regular exercise and a "heart healthy" diet.

## 2015-05-12 NOTE — Progress Notes (Signed)
[address]       [place] [address]             [phone]     CARDIOTHORACIC SURGERY OFFICE NOTE  Referring Provider is Larey Dresser, MD PCP is Woody Seller, MD   HPI:  Patient returns for routine followup approximately 1 year status post aortic valve replacement using a bioprosthetic tissue valve and coronary artery bypass grafting x1 on 05/03/2014. His postoperative course was uncomplicated and he was seen most recently in the office on 09/09/2014. Since then he has continued to do very well from a cardiac standpoint. He has been followed carefully by Dr. Marylou Mccoy who saw him most recently on 01/23/2015. The patient reports doing well with normal physical activity. He states that he only gets short of breath or mild tightness in his chest with strenuous activity, and this does not ever limit him to any significant degree. He is walking every day 2-3 miles. He enjoys bowling and playing golf. His primary limitation is that of chronic pain in his left hip related to arthritis. He overall feels well and he states that he feels much improved in comparison with prior to surgery.   Current Outpatient Prescriptions  Medication Sig Dispense Refill  . aspirin 81 MG tablet Take 81 mg by mouth daily.     Marland Kitchen atorvastatin (LIPITOR) 80 MG tablet Take 40 mg by mouth daily.    . calcium carbonate (OS-CAL) 600 MG TABS tablet Take 600 mg by mouth daily with breakfast.    . captopril (CAPOTEN) 100 MG tablet Take 100 mg by mouth 3 (three) times daily.    Marland Kitchen ipratropium (ATROVENT) 0.03 % nasal spray Place 1 spray into both nostrils 2 (two) times daily.    Marland Kitchen lisinopril (PRINIVIL,ZESTRIL) 10 MG tablet Take 1 tablet (10 mg total) by mouth daily. 90 tablet 3  . loratadine (CLARITIN) 10 MG tablet Take 10 mg by mouth daily.     . magnesium gluconate (MAGONATE) 500 MG tablet Take 500 mg by mouth daily.     . metoprolol tartrate (LOPRESSOR) 25 MG tablet Take 12.5 mg by mouth 2  (two) times daily.     . Multiple Vitamins-Minerals (PRESERVISION AREDS 2) CAPS Take 2 capsules by mouth daily.    . nitroGLYCERIN (NITROSTAT) 0.4 MG SL tablet Place 1 tablet (0.4 mg total) under the tongue every 5 (five) minutes as needed for chest pain. 90 tablet 3  . Omega-3 Fatty Acids (FISH OIL PO) Take 1 capsule by mouth daily.     . vardenafil (LEVITRA) 10 MG tablet DO NOT TAKE NITROGLYCERIN WITH 24  HOURS OF USING LEVITRA (Patient not taking: Reported on 05/12/2015) 10 tablet 0   No current facility-administered medications for this visit.      Physical Exam:   BP 139/71 mmHg  Pulse 54  Resp 16  Ht 5\' 11"  (1.803 m)  Wt 192 lb (87.091 kg)  BMI 26.79 kg/m2  SpO2 98%  General:  Well-appearing  Chest:   clear  CV:   Regular rate and rhythm without murmur  Incisions:  Completely healed, sternum is stable  Abdomen:  Soft and nontender  Extremities:  Warm and well-perfused  Diagnostic Tests:  n/a   Impression:  Patient is doing very well approximately one year status post aortic valve replacement with a bioprosthetic tissue valve and single-vessel coronary artery bypass grafting. The patient describes stable symptoms of exertional shortness of breath that occur only with  strenuous physical activity. Follow-up echocardiogram performed 06/03/2014 revealed normal functioning bioprosthetic tissue valve in the aortic position with normal left ventricular function.  Plan:  In the future the patient will call and return to see [place] as needed. He has been reminded regarding the lifelong need for antibiotic prophylaxis for all dental cleaning and related procedures.  The patient has been reminded of the long term benefits of regular exercise and a "heart healthy" diet.   I spent in excess of 15 minutes during the conduct of this office consultation and >50% of this time involved direct face-to-face encounter with the patient for counseling and/or coordination of their care.   Valentina Gu. Roxy Manns, MD 05/12/2015 2:58 PM

## 2015-08-11 ENCOUNTER — Ambulatory Visit (INDEPENDENT_AMBULATORY_CARE_PROVIDER_SITE_OTHER): Payer: PPO | Admitting: Physician Assistant

## 2015-08-11 ENCOUNTER — Encounter: Payer: Self-pay | Admitting: Physician Assistant

## 2015-08-11 VITALS — BP 114/66 | HR 51 | Ht 71.0 in | Wt 200.0 lb

## 2015-08-11 DIAGNOSIS — I2510 Atherosclerotic heart disease of native coronary artery without angina pectoris: Secondary | ICD-10-CM

## 2015-08-11 DIAGNOSIS — E785 Hyperlipidemia, unspecified: Secondary | ICD-10-CM

## 2015-08-11 DIAGNOSIS — I209 Angina pectoris, unspecified: Secondary | ICD-10-CM

## 2015-08-11 DIAGNOSIS — Z954 Presence of other heart-valve replacement: Secondary | ICD-10-CM | POA: Diagnosis not present

## 2015-08-11 DIAGNOSIS — I359 Nonrheumatic aortic valve disorder, unspecified: Secondary | ICD-10-CM | POA: Diagnosis not present

## 2015-08-11 DIAGNOSIS — Z953 Presence of xenogenic heart valve: Secondary | ICD-10-CM

## 2015-08-11 NOTE — Progress Notes (Signed)
Cardiology Office Note   Date:  08/11/2015   ID:  Travis Armstrong, DOB August 08, 1941, MRN [id number]  PCP:  Travis Seller, MD  Cardiologist:  Dr. Aundra Dubin   Chief Complaint:follow up    History of Present Illness: Travis Armstrong is a 74 y.o. male who presents for six-month follow-up. He has a history of CAD status post CABG with a LIMA to the LAD and AVR with a bio prosthetic valve in 04/2014. The valve was well-seated on echo 05/2014. The patient walks about 2 miles daily without symptoms. He denies any chest pain, palpitations, dyspnea, dyspnea on exertion, dizziness or presyncope. He's recently had some back trouble but it seems to have improved. He was seen by Dr. at [facility]. Patient also has history of hypertension and hyperlipidemia.  Past Medical History  Diagnosis Date  . Keratosis, actinic   . Muscle strain   . HTN (hypertension)   . CAD (coronary artery disease)     s/p CABG 04/2014 - LIMA to LAD  . Hyperlipidemia   . History of pneumonia 1958  . Bradycardia, sinus   . Aortic stenosis     a. bicuspid aortic valve;  b.  s/p AVR 04/2014 - 25 mm [facility] Ease bovine pericardial tissue valve  . Anginal pain     has not taken nitro  . Arthritis     back  . History of skin cancer   . Hx of echocardiogram     a. Echo (05/2014):  Mild focal basal septal hypertrophy, EF 55-60%, no RWMA, Gr 1 DD, AVR ok, MAC, mild LAE, mild RVE, mild RAE.    Past Surgical History  Procedure Laterality Date  . Coronary angioplasty with stent placement  11/23/2011    DES mid LAD  . Coronary angioplasty with stent placement  10/02/2012    DES prox LAD  . Inguinal hernia repair  ~ 2007    left  . Tee without cardioversion N/A 01/15/2014    Procedure: TRANSESOPHAGEAL ECHOCARDIOGRAM (TEE);  Surgeon: Larey Dresser, MD;  Location: [facility];  Service: Cardiovascular;  Laterality: N/A;  . Aortic valve replacement N/A 05/03/2014    Procedure: AORTIC VALVE REPLACEMENT (AVR);  Surgeon: Rexene Alberts,  MD;  Location: [facility] [place];  Service: Open Heart Surgery;  Laterality: N/A;  . Coronary artery bypass graft N/A 05/03/2014    Procedure: CORONARY ARTERY BYPASS GRAFTING (CABG);  Surgeon: Rexene Alberts, MD;  Location: [facility];  Service: Open Heart Surgery;  Laterality: N/A;  Times 1 using left internal mammary artery.  . Intraoperative transesophageal echocardiogram N/A 05/03/2014    Procedure: INTRAOPERATIVE TRANSESOPHAGEAL ECHOCARDIOGRAM;  Surgeon: Rexene Alberts, MD;  Location: [place] [facility] [place];  Service: Open Heart Surgery;  Laterality: N/A;  . Percutaneous coronary stent intervention (pci-s) N/A 11/23/2011    Procedure: PERCUTANEOUS CORONARY STENT INTERVENTION (PCI-S);  Surgeon: Peter M Martinique, MD;  Location: [facility] CATH LAB;  Service: Cardiovascular;  Laterality: N/A;  . Percutaneous coronary stent intervention (pci-s) N/A 10/02/2012    Procedure: PERCUTANEOUS CORONARY STENT INTERVENTION (PCI-S);  Surgeon: Hillary Bow, MD;  Location: [facility] CATH LAB;  Service: Cardiovascular;  Laterality: N/A;  . Left heart catheterization with coronary angiogram N/A 04/12/2014    Procedure: LEFT HEART CATHETERIZATION WITH CORONARY ANGIOGRAM;  Surgeon: Larey Dresser, MD;  Location: [facility] CATH LAB;  Service: Cardiovascular;  Laterality: N/A;     Current Outpatient Prescriptions  Medication Sig Dispense Refill  . aspirin 81 MG tablet Take 81 mg by mouth daily.     Marland Kitchen  atorvastatin (LIPITOR) 80 MG tablet Take 40 mg by mouth daily.    . calcium carbonate (OS-CAL) 600 MG TABS tablet Take 600 mg by mouth daily with breakfast.    . captopril (CAPOTEN) 100 MG tablet Take 100 mg by mouth 3 (three) times daily.    Marland Kitchen ipratropium (ATROVENT) 0.03 % nasal spray Place 1 spray into both nostrils 2 (two) times daily.    Marland Kitchen lisinopril (PRINIVIL,ZESTRIL) 10 MG tablet Take 1 tablet (10 mg total) by mouth daily. 90 tablet 3  . loratadine (CLARITIN) 10 MG tablet Take 10 mg by mouth daily.     . magnesium gluconate (MAGONATE) 500 MG tablet Take 500 mg by  mouth daily.     . metoprolol tartrate (LOPRESSOR) 25 MG tablet Take 12.5 mg by mouth 2 (two) times daily.     . Multiple Vitamins-Minerals (PRESERVISION AREDS 2) CAPS Take 2 capsules by mouth daily.    . nitroGLYCERIN (NITROSTAT) 0.4 MG SL tablet Place 1 tablet (0.4 mg total) under the tongue every 5 (five) minutes as needed for chest pain. 90 tablet 3  . Omega-3 Fatty Acids (FISH OIL PO) Take 1 capsule by mouth daily.      No current facility-administered medications for this visit.    Allergies:   Penicillins    Social History:  The patient  reports that he quit smoking about 43 years ago. His smoking use included Cigarettes. He has a 8 pack-year smoking history. He has never used smokeless tobacco. He reports that he does not drink alcohol or use illicit drugs.   Family History:  The patient's    family history includes COPD in his sister; Congestive Heart Failure in his mother; Coronary artery disease in his brother; Diabetes in his sister; Heart disease in his father; Lung cancer in his sister; Stroke in his brother. There is no history of Colon cancer, Esophageal cancer, Stomach cancer, or Rectal cancer.    ROS:  Please see the history of present illness.   Otherwise, review of systems are positive for snoring and easy bruising.   All other systems are reviewed and negative.    PHYSICAL EXAM: VS:  BP 114/66 mmHg  Pulse 51  Ht 5\' 11"  (1.803 m)  Wt 200 lb (90.719 kg)  BMI 27.91 kg/m2 , BMI Body mass index is 27.91 kg/(m^2). GEN: Well nourished, well developed, in no acute distress Neck: no JVD, HJR, carotid bruits, or masses Cardiac:  RRR; 2/6 systolic murmur at the left sternal border no,gallop, rubs, thrill or heave,  Respiratory:  clear to auscultation bilaterally, normal work of breathing GI: soft, nontender, nondistended, + BS MS: no deformity or atrophy Extremities: without cyanosis, clubbing, edema, good distal pulses bilaterally.  Skin: warm and dry, no rash Neuro:   Strength and sensation are intact    EKG:  EKG is ordered today. The ekg ordered today demonstrates sinus bradycardia at 51 bpm complete right bundle branch block, no acute change   Recent Labs: 10/07/2014: BUN 11; Creatinine, Ser 1.2; Potassium 4.0; Sodium 138    Lipid Panel    Component Value Date/Time   CHOL 80 07/31/2014 0921   TRIG 50.0 07/31/2014 0921   HDL 37.80* 07/31/2014 0921   CHOLHDL 2 07/31/2014 0921   VLDL 10.0 07/31/2014 0921   LDLCALC 32 07/31/2014 0921      Wt Readings from Last 3 Encounters:  08/11/15 200 lb (90.719 kg)  05/12/15 192 lb (87.091 kg)  01/23/15 196 lb (88.905 kg)  Other studies Reviewed: Additional studies/ records that were reviewed today include and review of the records demonstrates:  2-D echo 6/2016Study Conclusions  - Left ventricle: The cavity size was normal. There was mild focal   basal hypertrophy of the septum. Systolic function was normal.   The estimated ejection fraction was in the range of 55% to 60%.   Wall motion was normal; there were no regional wall motion   abnormalities. Doppler parameters are consistent with abnormal   left ventricular relaxation (grade 1 diastolic dysfunction). - Aortic valve: A bioprosthesis was present. - Mitral valve: Calcified annulus. - Left atrium: The atrium was mildly dilated. - Right ventricle: The cavity size was mildly dilated. - Right atrium: The atrium was mildly dilated.  Impressions:  - Normal LV function; mild LAE/RAE/RVE; well seated prosthetic   aortic valve with normal gradients. Compared to 04/12/14,   prosthetic aortic valve is new.   ASSESSMENT AND PLAN:  CAD (coronary artery disease) Patient is doing well status post CABG 1 in aVR in 04/2014. He is walking 2 miles daily without difficulty. Continue aspirin, Lipitor, lisinopril and metoprolol. Follow-up with Dr. Marigene Ehlers in 6 months.  S/P aortic valve replacement with bioprosthetic valve and CABG x Patient doing  well without symptoms. Very small murmur.  Hyperlipidemia Lipid panel was checked at the [facility] in February 2016. Have asked the patient to obtain a copy for [place]. 2 Lipitor.    Signed, Ermalinda Barrios, PA-C  08/11/2015 [facility] [address], [place], [place]  [address] Phone: [phone]; Fax: [phone]

## 2015-08-11 NOTE — Assessment & Plan Note (Signed)
Patient is doing well status post CABG 1 in aVR in 04/2014. He is walking 2 miles daily without difficulty. Continue aspirin, Lipitor, lisinopril and metoprolol. Follow-up with Dr. Marigene Ehlers in 6 months.

## 2015-08-11 NOTE — Patient Instructions (Signed)
Medication Instructions: - no changes  Labwork: - none  Procedures/Testing: - none  Follow-Up: - Your physician wants you to follow-up in: 6 months with Dr. Aundra Dubin. You will receive a reminder letter in the mail two months in advance. If you don't receive a letter, please call our office to schedule the follow-up appointment.  Any Additional Special Instructions Will Be Listed Below (If Applicable). - none

## 2015-08-11 NOTE — Assessment & Plan Note (Signed)
Patient doing well without symptoms. Very small murmur.

## 2015-08-11 NOTE — Assessment & Plan Note (Signed)
Lipid panel was checked at the [facility] in February 2016. Have asked the patient to obtain a copy for [place]. 2 Lipitor.

## 2015-09-24 ENCOUNTER — Encounter: Payer: Self-pay | Admitting: Gastroenterology

## 2015-10-15 ENCOUNTER — Encounter (HOSPITAL_COMMUNITY): Payer: PPO

## 2015-12-09 ENCOUNTER — Encounter: Payer: Self-pay | Admitting: Cardiology

## 2016-01-27 ENCOUNTER — Encounter: Payer: Self-pay | Admitting: Physician Assistant

## 2016-01-27 ENCOUNTER — Ambulatory Visit (INDEPENDENT_AMBULATORY_CARE_PROVIDER_SITE_OTHER): Payer: PPO | Admitting: Physician Assistant

## 2016-01-27 VITALS — BP 120/68 | HR 58 | Ht 71.0 in | Wt 205.0 lb

## 2016-01-27 DIAGNOSIS — Z954 Presence of other heart-valve replacement: Secondary | ICD-10-CM

## 2016-01-27 DIAGNOSIS — Z953 Presence of xenogenic heart valve: Secondary | ICD-10-CM

## 2016-01-27 DIAGNOSIS — R0789 Other chest pain: Secondary | ICD-10-CM | POA: Diagnosis not present

## 2016-01-27 DIAGNOSIS — E785 Hyperlipidemia, unspecified: Secondary | ICD-10-CM | POA: Diagnosis not present

## 2016-01-27 DIAGNOSIS — Q231 Congenital insufficiency of aortic valve: Secondary | ICD-10-CM

## 2016-01-27 MED ORDER — RANOLAZINE ER 500 MG PO TB12: 500.0000 mg | ORAL_TABLET | Freq: Two times a day (BID) | ORAL | Status: DC

## 2016-01-27 MED ORDER — ISOSORBIDE MONONITRATE ER 30 MG PO TB24: 30.0000 mg | ORAL_TABLET | Freq: Every day | ORAL | Status: DC

## 2016-01-27 NOTE — Progress Notes (Addendum)
Cardiology Office Note   Date:  01/27/2016   ID:  Travis Armstrong, DOB [birth year], MRN [id number]  PCP:  Woody Seller, MD  Cardiologist:  Dr.McLean   Chief Complaint: Chest tightness    History of Present Illness: Travis Armstrong is a 75 y.o. male who presents for the follow-up. He has history of CAD status post DES to the mid LAD 11/2011. In 09/2012 he had a new 99% stenosis in the proximal LAD just proximal to the stent treated with DES. He also has a bicuspid aortic valve and an MR angiogram of the thoracic aorta ruled out aortic aneurysm. Echo in 12/2013 was concerning for moderate to severe AS and TEE was done to assess more closely and was consistent with moderate left ear. 03/2014 patient had in-stent restenosis of the mid LAD and echo suggestive of moderate to severe AS. Patient underwent LIMA to the LAD with a bio prosthetic aVR by Dr. Roxy Manns 04/2014. Postop echo 05/2014 showed normal EF and well-seated bioprosthetic aortic valve.  Patient complains of 1 1/2 month history of chest tightness with activity such raking, walking 1/2 mile, after bowling and golfing but not during the activity. Also associated dyspnea. Pain eases with rest within 30 minutes. Hasn't used NTG because of headaches. No radiation of the pain, dizziness or presyncope. Also complains of soreness in his chest that hurts to touch. [facility] [place] and it hurt to touch his chest.we are trying to get those results. Having back issues which have limited his activity. He also says he's unsteady and off balance when he walks sometimes. No significant dizziness.    Past Medical History  Diagnosis Date  . Keratosis, actinic   . Muscle strain   . HTN (hypertension)   . CAD (coronary artery disease)     s/p CABG 04/2014 - LIMA to LAD  . Hyperlipidemia   . History of pneumonia 1958  . Bradycardia, sinus   . Aortic stenosis     a. bicuspid aortic valve;  b.  s/p AVR 04/2014 - 25 mm [facility] Ease bovine  pericardial tissue valve  . Anginal pain (HCC)     has not taken nitro  . Arthritis     back  . History of skin cancer   . Hx of echocardiogram     a. Echo (05/2014):  Mild focal basal septal hypertrophy, EF 55-60%, no RWMA, Gr 1 DD, AVR ok, MAC, mild LAE, mild RVE, mild RAE.    Past Surgical History  Procedure Laterality Date  . Coronary angioplasty with stent placement  11/23/2011    DES mid LAD  . Coronary angioplasty with stent placement  10/02/2012    DES prox LAD  . Inguinal hernia repair  ~ 2007    left  . Tee without cardioversion N/A 01/15/2014    Procedure: TRANSESOPHAGEAL ECHOCARDIOGRAM (TEE);  Surgeon: Larey Dresser, MD;  Location: [place];  Service: Cardiovascular;  Laterality: N/A;  . Aortic valve replacement N/A 05/03/2014    Procedure: AORTIC VALVE REPLACEMENT (AVR);  Surgeon: Rexene Alberts, MD;  Location: [place];  Service: Open Heart Surgery;  Laterality: N/A;  . Coronary artery bypass graft N/A 05/03/2014    Procedure: CORONARY ARTERY BYPASS GRAFTING (CABG);  Surgeon: Rexene Alberts, MD;  Location: [place];  Service: Open Heart Surgery;  Laterality: N/A;  Times 1 using left internal mammary artery.  . Intraoperative transesophageal echocardiogram N/A 05/03/2014    Procedure: INTRAOPERATIVE TRANSESOPHAGEAL  ECHOCARDIOGRAM;  Surgeon: Rexene Alberts, MD;  Location: [place];  Service: Open Heart Surgery;  Laterality: N/A;  . Percutaneous coronary stent intervention (pci-s) N/A 11/23/2011    Procedure: PERCUTANEOUS CORONARY STENT INTERVENTION (PCI-S);  Surgeon: Peter M Martinique, MD;  Location: [facility] CATH LAB;  Service: Cardiovascular;  Laterality: N/A;  . Percutaneous coronary stent intervention (pci-s) N/A 10/02/2012    Procedure: PERCUTANEOUS CORONARY STENT INTERVENTION (PCI-S);  Surgeon: Hillary Bow, MD;  Location: [facility] CATH LAB;  Service: Cardiovascular;  Laterality: N/A;  . Left heart catheterization with coronary angiogram N/A 04/12/2014    Procedure: LEFT HEART  CATHETERIZATION WITH CORONARY ANGIOGRAM;  Surgeon: Larey Dresser, MD;  Location: [facility] CATH LAB;  Service: Cardiovascular;  Laterality: N/A;     Current Outpatient Prescriptions  Medication Sig Dispense Refill  . aspirin 81 MG tablet Take 81 mg by mouth daily.     Marland Kitchen atorvastatin (LIPITOR) 80 MG tablet Take 40 mg by mouth daily.    . calcium carbonate (OS-CAL) 600 MG TABS tablet Take 600 mg by mouth daily with breakfast.    . gabapentin (NEURONTIN) 300 MG capsule Take 300 mg by mouth 3 (three) times daily.    Marland Kitchen ipratropium (ATROVENT) 0.03 % nasal spray Place 1 spray into both nostrils 2 (two) times daily.    . isosorbide mononitrate (IMDUR) 30 MG 24 hr tablet Take 1 tablet (30 mg total) by mouth daily. 90 tablet 3  . lisinopril (PRINIVIL,ZESTRIL) 10 MG tablet Take 1 tablet (10 mg total) by mouth daily. 90 tablet 3  . loratadine (CLARITIN) 10 MG tablet Take 10 mg by mouth daily.     . magnesium gluconate (MAGONATE) 500 MG tablet Take 500 mg by mouth daily.     . metoprolol tartrate (LOPRESSOR) 25 MG tablet Take 12.5 mg by mouth 2 (two) times daily.     . Multiple Vitamins-Minerals (PRESERVISION AREDS 2) CAPS Take 2 capsules by mouth daily.    . nitroGLYCERIN (NITROSTAT) 0.4 MG SL tablet Place 1 tablet (0.4 mg total) under the tongue every 5 (five) minutes as needed for chest pain. 90 tablet 3  . Omega-3 Fatty Acids (FISH OIL PO) Take 1 capsule by mouth daily.      No current facility-administered medications for this visit.    Allergies:   Penicillins    Social History:  The patient  reports that he quit smoking about 43 years ago. His smoking use included Cigarettes. He has a 8 pack-year smoking history. He has never used smokeless tobacco. He reports that he does not drink alcohol or use illicit drugs.   Family History:  The patient's    family history includes COPD in his sister; Congestive Heart Failure in his mother; Coronary artery disease in his brother; Diabetes in his sister; Heart  disease in his father; Lung cancer in his sister; Stroke in his brother. There is no history of Colon cancer, Esophageal cancer, Stomach cancer, or Rectal cancer.    ROS:  Please see the history of present illness.   Otherwise, review of systems are positive for snoring, back pain, muscle pain, easy bruising excessive fatigue.   All other systems are reviewed and negative.    PHYSICAL EXAM: VS:  BP 120/68 mmHg  Pulse 58  Ht 5\' 11"  (1.803 m)  Wt 205 lb (92.987 kg)  BMI 28.60 kg/m2 , BMI Body mass index is 28.6 kg/(m^2). GEN: Well nourished, well developed, in no acute distress Neck: no JVD, HJR, carotid bruits, or  masses Cardiac: Patient has some pinpoint soreness to touch in his upper left chest, RRR; split S1, S4, crisp valve 1/6 systolic murmur at the left sternal border, no rubs, thrill or heave,  Respiratory:  clear to auscultation bilaterally, normal work of breathing GI: soft, nontender, nondistended, + BS MS: no deformity or atrophy Extremities: without cyanosis, clubbing, edema, good distal pulses bilaterally.  Skin: warm and dry, no rash Neuro:  Strength and sensation are intact    EKG:  EKG is ordered today. The ekg ordered today demonstrates normal sinus rhythm with right bundle branch block last EKG was incomplete right bundle branch block   Recent Labs: No results found for requested labs within last 365 days.    Lipid Panel    Component Value Date/Time   CHOL 80 07/31/2014 0921   TRIG 50.0 07/31/2014 0921   HDL 37.80* 07/31/2014 0921   CHOLHDL 2 07/31/2014 0921   VLDL 10.0 07/31/2014 0921   LDLCALC 32 07/31/2014 0921      Wt Readings from Last 3 Encounters:  01/27/16 205 lb (92.987 kg)  08/11/15 200 lb (90.719 kg)  05/12/15 192 lb (87.091 kg)      Other studies Reviewed: Additional studies/ records that were reviewed today include and review of the records demonstrates:  Carotid Dopplers 10/15/15 showed no significant stenosis  Study  Conclusions  - Left ventricle: The cavity size was normal. There was mild focal   basal hypertrophy of the septum. Systolic function was normal.   The estimated ejection fraction was in the range of 55% to 60%.   Wall motion was normal; there were no regional wall motion   abnormalities. Doppler parameters are consistent with abnormal   left ventricular relaxation (grade 1 diastolic dysfunction). - Aortic valve: A bioprosthesis was present. - Mitral valve: Calcified annulus. - Left atrium: The atrium was mildly dilated. - Right ventricle: The cavity size was mildly dilated. - Right atrium: The atrium was mildly dilated.  Impressions:  - Normal LV function; mild LAE/RAE/RVE; well seated prosthetic   aortic valve with normal gradients. Compared to 04/12/14,   prosthetic aortic valve is new.   ASSESSMENT AND PLAN: Chest tightness Patient is complaining of 1-1/2 month history of recurrent chest tightness similar to his prior angina. He is also having some soreness in his chest. He's had severe headaches on them door in the past. Will add Ranexa 500 mg twice a day. Schedule exercise Myoview to rule out ischemia. Have moved his appointment up with Dr. Aundra Dubin Thursday. Proceed to the emergency room for prolonged chest pain. He does have fresh nitroglycerin but will use it because of severe headache.  Bicuspid aortic valve Valve sounds stable. He had a echo at [facility] in the past week or so that we are trying to get the results of. This was ordered by the [place].  Hyperlipidemia Recent blood work at the [place]. Continue Lipitor.  S/P aortic valve replacement with bioprosthetic valve and CABG x Now having angina. Have schedule stress test     SignedErmalinda Barrios, PA-C  01/27/2016 8:40 AM    [facility] [place], [place], [place]  [address] Phone: [phone]; Fax: [phone]    Received 2-D echo from [facility] dated 12/25/15 showed normal  LV size and function EF 65% with grade 2 diastolic dysfunction , normal RV systolic function moderate LAE enlargement and normal functioning bioprosthetic aortic valve

## 2016-01-27 NOTE — Assessment & Plan Note (Signed)
Now having angina. Have schedule stress test

## 2016-01-27 NOTE — Patient Instructions (Addendum)
Medication Instructions:  1) START Ranexa 500mg  twice daily  Labwork: None  Testing/Procedures: Your physician has requested that you have en exercise stress myoview. For further information please visit [email or web address]. Please follow instruction sheet, as given.   Follow-Up: Your physician recommends that you keep your follow-up appointment on February 05, 2016.   Any Other Special Instructions Will Be Listed Below (If Applicable).     If you need a refill on your cardiac medications before your next appointment, please call your pharmacy.

## 2016-01-27 NOTE — Assessment & Plan Note (Addendum)
Patient is complaining of 1-1/2 month history of recurrent chest tightness similar to his prior angina. He is also having some soreness in his chest. He's had severe headaches on them door in the past. Will add Ranexa 500 mg twice a day. Schedule exercise Myoview to rule out ischemia. Have moved his appointment up with Dr. Aundra Dubin Thursday. Proceed to the emergency room for prolonged chest pain. He does have fresh nitroglycerin but will use it because of severe headache.

## 2016-01-27 NOTE — Assessment & Plan Note (Signed)
Valve sounds stable. He had a echo at [facility] in the past week or so that we are trying to get the results of. This was ordered by the [place].

## 2016-01-27 NOTE — Assessment & Plan Note (Signed)
Recent blood work at the [place]. Continue Lipitor.

## 2016-01-29 ENCOUNTER — Telehealth (HOSPITAL_COMMUNITY): Payer: Self-pay | Admitting: *Deleted

## 2016-01-29 NOTE — Telephone Encounter (Signed)
Left message on voicemail in reference to upcoming appointment scheduled for 02/04/16. Phone number given for a call back so details instructions can be given. Hubbard Robinson, RN

## 2016-02-02 ENCOUNTER — Telehealth (HOSPITAL_COMMUNITY): Payer: Self-pay | Admitting: *Deleted

## 2016-02-02 NOTE — Telephone Encounter (Signed)
Patient given detailed instructions per Myocardial Perfusion Study Information Sheet for the test on 02/04/16 at 10:00. Patient notified to arrive 15 minutes early and that it is imperative to arrive on time for appointment to keep from having the test rescheduled.  If you need to cancel or reschedule your appointment, please call the office within 24 hours of your appointment. Failure to do so may result in a cancellation of your appointment, and a $50 no show fee. Patient verbalized understanding. Travis Armstrong

## 2016-02-04 ENCOUNTER — Ambulatory Visit (HOSPITAL_COMMUNITY): Payer: PPO | Attending: Cardiovascular Disease

## 2016-02-04 DIAGNOSIS — I4510 Unspecified right bundle-branch block: Secondary | ICD-10-CM | POA: Insufficient documentation

## 2016-02-04 DIAGNOSIS — I10 Essential (primary) hypertension: Secondary | ICD-10-CM | POA: Diagnosis not present

## 2016-02-04 DIAGNOSIS — R0789 Other chest pain: Secondary | ICD-10-CM | POA: Insufficient documentation

## 2016-02-04 LAB — MYOCARDIAL PERFUSION IMAGING
CHL CUP NUCLEAR SRS: 1
CHL CUP RESTING HR STRESS: 49 {beats}/min
CSEPED: 8 min
Estimated workload: 10.1 METS
Exercise duration (sec): 23 s
LV dias vol: 101 mL
LV sys vol: 41 mL
MPHR: 146 {beats}/min
Peak HR: 129 {beats}/min
Percent HR: 88 %
RATE: 0.43
RPE: 19
SDS: 2
SSS: 3
TID: 1.06

## 2016-02-04 MED ORDER — TECHNETIUM TC 99M SESTAMIBI GENERIC - CARDIOLITE
9.8000 | Freq: Once | INTRAVENOUS | Status: AC | PRN
  Administered 2016-02-04: 10 via INTRAVENOUS

## 2016-02-04 MED ORDER — TECHNETIUM TC 99M SESTAMIBI GENERIC - CARDIOLITE
33.0000 | Freq: Once | INTRAVENOUS | Status: AC | PRN
  Administered 2016-02-04: 33 via INTRAVENOUS

## 2016-02-05 ENCOUNTER — Telehealth: Payer: Self-pay | Admitting: Cardiology

## 2016-02-05 ENCOUNTER — Encounter: Payer: Self-pay | Admitting: Cardiology

## 2016-02-05 ENCOUNTER — Encounter: Payer: Self-pay | Admitting: *Deleted

## 2016-02-05 ENCOUNTER — Ambulatory Visit (INDEPENDENT_AMBULATORY_CARE_PROVIDER_SITE_OTHER): Payer: PPO | Admitting: Cardiology

## 2016-02-05 VITALS — BP 136/58 | HR 68 | Ht 71.0 in | Wt 208.0 lb

## 2016-02-05 DIAGNOSIS — I359 Nonrheumatic aortic valve disorder, unspecified: Secondary | ICD-10-CM

## 2016-02-05 DIAGNOSIS — I2510 Atherosclerotic heart disease of native coronary artery without angina pectoris: Secondary | ICD-10-CM

## 2016-02-05 DIAGNOSIS — I25110 Atherosclerotic heart disease of native coronary artery with unstable angina pectoris: Secondary | ICD-10-CM

## 2016-02-05 DIAGNOSIS — Z953 Presence of xenogenic heart valve: Secondary | ICD-10-CM

## 2016-02-05 DIAGNOSIS — Z951 Presence of aortocoronary bypass graft: Secondary | ICD-10-CM

## 2016-02-05 DIAGNOSIS — Z954 Presence of other heart-valve replacement: Secondary | ICD-10-CM

## 2016-02-05 DIAGNOSIS — R0602 Shortness of breath: Secondary | ICD-10-CM | POA: Diagnosis not present

## 2016-02-05 MED ORDER — POTASSIUM CHLORIDE ER 10 MEQ PO TBCR: 10.0000 meq | EXTENDED_RELEASE_TABLET | ORAL | Status: DC

## 2016-02-05 MED ORDER — FUROSEMIDE 20 MG PO TABS: 20.0000 mg | ORAL_TABLET | ORAL | Status: DC

## 2016-02-05 NOTE — Telephone Encounter (Signed)
[facility] states the [facility] is [facility] a note stating he was here today and seen by Dr. Aundra Dubin before they will fill his prescriptions.  AttnEustaquio Maize [phone].  (Ranexa 500 mg and furosemide and potassium)  Her phone number is [phone], Eustaquio Maize is nurse with Dr. Rondell Reams. The patient is requesting that I provide this information to them.  Faxing note to [place] at [place].  He then states she might be asking for an OV note, he is not sure. I will send a note stating he was seen today, if she needs more, she will call to let him know.  He will call us back.

## 2016-02-05 NOTE — Telephone Encounter (Signed)
Pt was here earlier,please call today concerning his medicine.

## 2016-02-05 NOTE — Patient Instructions (Addendum)
Medication Instructions:  Your physician has recommended you make the following change in your medication: 1) START Lasix 20 mg every other day 2) START Potassium 10 mEq every other day  Make sure to start taking your Ranexa. This is a twice a day medication.  Labwork: Today: BNP & BMET  Testing/Procedures: None ordered  Follow-Up: Keep follow up with Dr. Aundra Dubin on 03/02/16 at 2:00 p.m.  If you need a refill on your cardiac medications before your next appointment, please call your pharmacy.  Thank you for choosing [facility]!!

## 2016-02-05 NOTE — Progress Notes (Signed)
Patient ID: Travis Armstrong, male   DOB: 13-Jul-1941, 75 y.o.   MRN: [id number] PCP: Dr. Redmond Pulling  75 yo with history of HTN presented initially for evaluation of chest pain in 2011.  It occurred with splitting wood and after walking about 1/4 mile on flat ground.  Given these exertional symptoms, I set him up for an ETT-myoview. This showed evidence for ischemia by exercise ECG, and there was a small reversible apical perfusion defect possibly suggestive of ischemia as well.  Echo to assess for cause of systolic murmur showed a bicuspid aortic valve with preserved LV systolic function and no significant aortic stenosis.  I set him up for a left heart cath in 1/12, which showed heavy calcification in the LAD.  There was a long 90% mid LAD stenosis.  The LAD was small at this point, a 2-2.5 mm vessel.  I decided to proceed with medical treatment as his symptoms were only with moderate exertion and as the LAD was a small caliber vessel at the site of the stenosis.  Given the bicuspid aortic valve, I did an MR angiogram of the thoracic aorta, which ruled out aortic aneurysm.  In the fall of 2012, he had recurrent chest pain and I repeated a cath, this showed worsening of the mid LAD stenosis up to 95%.  He underwent PCI with Promus DES to the mid LAD in 12/12.  In the fall of 2013, he again developed exertional chest pain.  I did a left heart cath in 10/13 showing 99% stenosis in the proximal LAD just proximal to the stent.  He had a Promus DES placed to the proximal LAD.  In 1/15, patient had echo concerning for moderate-severe AS (progression).  TEE was done to assess more closely, this was more consistent with moderate AS. Patient again developed exertional chest pain in 4/15 and had LHC showing 80% mLAD in-stent restenosis.  Echo was done again in 4/15, suggestive of moderate to severe AS.  Patient was then taken for LIMA-LAD with bioprosthetic AVR by Dr. Roxy Manns in 5/15.  Post-op echo in 6/15 showed normal EF and  well-seated bioprosthetic aortic valve.   For a couple of months, he has been having left lateral chest tightness (feels like a "swelling").  He notes it when he is lifting something or using his arms to work.  He has not noted it with walking.  Sometimes he will have episodes at rest but it seems to come on most with exertion.  He has had a tender spot on his left lateral chest.  He has some shortness of breath with heavy exertion like walking up a hill and feels like he is carrying excess fluid. He has bilateral ankle edema.  He saw Estella Husk and was given a prescription for ranolazine and ETT-Cardiolite was ordered. He had no chest pain while walking on the treadmill.  Cardiolite result was not available at his office visit today, but review after the office visit showed small reversible basal to mid inferolateral perfusion defect concerning for ischemia.  Echo (1/17) from the [place] showed EF 65% and normal bioprosthetic aortic valve. He never started the ranolazine.   Labs (10/11): LDL 113, HDL 46, K 5.1, creatinine 1.1, LFTs normal Labs (1/12): K 5.3, creatinine 1.1 Labs (3/12): LDL 51, HDL 37 Labs (12/12): K 4.1, creatinine 0.98 Labs (4/13): LDL 53, HDL 45 Labs (9/13): LDL 54, HDL 46, LFTs normal, TSH normal, K 4.7, creatinine 0.9 Labs (3/14): LDL 36, HDL 38 Labs (  10/14): K 4.5, creatinine 1.1, AST 40, ALT 42, LDL 27, HDL 44 Labs (6/15): K 4, creatinine 1.1, HCT 55 Labs (8/15): K 4.9, creatinine 1.2, LDL 32 Labs (10/15): K 4, creatinine 1.2  Allergies (verified):  1)  ! Pcn  Past Medical History: 1. Actinic Keratosis 2. Muscle strains  3. HTN 4. CAD: Anginal-type chest pain.  ETT-myoview (1/12) showed good exercise tolerance but also inferior and V5/V6 1 mm ST depression and chest pain with exercise.  Perfusion images showed reversible apical perfusion defect.  Findings suggested ischemia.  LHC (1/12): LAD heavily calcified with 90% calcified mid LAD stenosis after D2.  The LAD was  small caliber (2-2.5 mm) at this point.  Because of this and his lack of unstable symptoms, I elected to manage him medically initially.  ETT-myoview (11/12): 9'30" exercise with mild chest pain and ischemic ST changes.  There was a small reversible apical perfusion defect, similar to the 1/12 study.  LHC repeated 12/12 with 95% mid LAD stenosis after D2.  Promus DES to mid LAD.  Exertional chest pain recurred with LHC done again in 10/13.  There was 99% proximal LAD stenosis just proximal to the stent.  Patient received a Promus DES.   LHC (4/15) with 80% mLAD in-stent restenosis and small to moderate D2 with 90% proximal stenosis.  Patient had LIMA-LAD along with AVR in 5/15.  ETT-Cardiolite (2/17) with EF 59%, 8'23" exercise to 10.1 METS with no chest pain, small reversible basal to mid inferolateral perfusion defect.  5. Bicuspid aortic valve with mild-moderate AS: Echo (1/12) showed EF 60-65%, bicuspid aortic valve with minimal stenosis (mean gradient 9 mmHg), grade II diastolic dysfunction, mild MR, mild LAE.  MR angiogram chest (12/12): No thoracic aortic aneurysm.  Echo (1/14) showed EF 65-70% with mild to moderate AS, mean gradient 22 mmHg.  Echo (1/15) with moderate to severe AS, mean gradient 36, peak 55, AVA 1.0 cm^2.  TEE (1/15) with EF 60-65%, bicuspid aortic valve, AVA 1.0 cm^2 with mean gradient 26 mmHg, peak 48 mmHg, moderate MR, RV normal.  Echo (4/15) with EF 60-65%, moderate LVH, moderate to severe AS with mean gradient 35 mmHg and AVA 0.7 cm^2.  Patient had bioprosthetic AVR in 5/15.  Echo post-op in 6/15 with EF 55-60%, normal bioprosthetic aortic valve. Echo (1/17) with EF 65%, normal RV size and systolic function, normal bioprosthetic aortic valve.  6. Carotid dopplers (1/12): No significant stenosis.  Carotid dopplers (10/14) with mild bilateral ICA stenosis.  7.  Hyperlipidemia: Myalgias with Lipitor.  8.  Mild bradycardia  Family History: 2 brothers with CAD diagnosed in their 51s.   Sister with diabetes, died of complications from this disease.   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    Current Outpatient Prescriptions  Medication Sig Dispense Refill  . aspirin 81 MG tablet Take 81 mg by mouth daily.     Marland Kitchen atorvastatin (LIPITOR) 80 MG tablet Take 40 mg by mouth daily.    . calcium carbonate (OS-CAL) 600 MG TABS tablet Take 600 mg by mouth daily with breakfast.    . gabapentin (NEURONTIN) 300 MG capsule Take 300 mg by mouth 3 (three) times daily.    Marland Kitchen ipratropium (ATROVENT) 0.03 % nasal spray Place 1 spray into both nostrils 2 (two) times daily.    Marland Kitchen  lisinopril (PRINIVIL,ZESTRIL) 10 MG tablet Take 1 tablet (10 mg total) by mouth daily. 90 tablet 3  . loratadine (CLARITIN) 10 MG tablet Take 10 mg by mouth daily.     . magnesium gluconate (MAGONATE) 500 MG tablet Take 500 mg by mouth daily.     . metoprolol tartrate (LOPRESSOR) 25 MG tablet Take 12.5 mg by mouth 2 (two) times daily.     . Multiple Vitamins-Minerals (PRESERVISION AREDS 2) CAPS Take 2 capsules by mouth daily.    . nitroGLYCERIN (NITROSTAT) 0.4 MG SL tablet Place 1 tablet (0.4 mg total) under the tongue every 5 (five) minutes as needed for chest pain. 90 tablet 3  . Omega-3 Fatty Acids (FISH OIL PO) Take 1 capsule by mouth daily.     . ranolazine (RANEXA) 500 MG 12 hr tablet Take 1 tablet (500 mg total) by mouth 2 (two) times daily. 60 tablet 11  . furosemide (LASIX) 20 MG tablet Take 1 tablet (20 mg total) by mouth every other day. 45 tablet 1  . potassium chloride (K-DUR) 10 MEQ tablet Take 1 tablet (10 mEq total) by mouth every other day. 45 tablet 1   No current facility-administered medications for this visit.    BP 136/58 mmHg  Pulse 68  Ht 5\' 11"  (1.803 m)  Wt 208 lb (94.348 kg)  BMI 29.02  kg/m2 General:  Well developed, well nourished, in no acute distress. Neck:  Neck supple, JVP 7-8 cm. No masses, thyromegaly or abnormal cervical nodes. Lungs:  Slight decreased breath sounds right base.  Heart:  Non-displaced PMI, chest non-tender; regular rate and rhythm, S1, S2.  1/6 early SEM. +S4. Carotid upstroke normal.  Pedals normal pulses. 1+ ankle edema.  Right radial pulse weaker than left radial.  Abdomen:  Bowel sounds positive; abdomen soft and non-tender without masses, organomegaly, or hernias noted. No hepatosplenomegaly. Extremities:  No clubbing or cyanosis. Neurologic:  Alert and oriented x 3. Psych:  Normal affect.  Assessment/Plan:  AORTIC VALVE DISORDERS Bicuspid aortic valve with moderate-severe AS and no associated thoracic aortic aneurysm now s/p bioprosthetic AVR.  The valve was well-seated on 1/17 echo.  Coronary artery disease  Status post PCI with DES to proximal LAD in 12/12 and again in 10/13 due to in-stent restenosis.  He had recurrent in-stent restenosis of LAD stent in 4/15, now s/p LIMA-LAD.  He has had recurrent primarily exertional chest tightness over the last couple of months.  It is relatively mild.  ETT-Cardiolite was done yesterday with good exercise tolerance (10.1 METS) and no CP on the treadmill, but there was a small area of basal to mid inferolateral ischemia. I think that this is overall a low risk study and medical management initially would be appropriate (I did not get full study report until after patient had left).  - Continue ASA 81, ACEI, metoprolol, and statin.  - He cannot take Imdur due to headaches. - I started him on ranolazine 500 mg bid which seems to be a reasonable first step (medical management).  If he has exertional chest pain despite the ranolazine, it would be reasonable to do angiography. He knows to come to the ER with prolonged pain.  - I will reassess him in about 3 wks.  He will call if chest pain worsens.   HYPERLIPIDEMIA [facility] will call [facility] for recent lipids. Chronic diastolic CHF Possible mild volume overload.  He feels like he is carrying fluid. I will let him take Lasix 20 mg + KCl 10  mEq every other day.  HTN BP controlled.  Loralie Champagne 02/05/2016

## 2016-02-06 ENCOUNTER — Telehealth: Payer: Self-pay | Admitting: Cardiology

## 2016-02-06 LAB — BASIC METABOLIC PANEL
BUN: 14 mg/dL (ref 7–25)
CALCIUM: 9.1 mg/dL (ref 8.6–10.3)
CO2: 25 mmol/L (ref 20–31)
Chloride: 105 mmol/L (ref 98–110)
Creat: 1.18 mg/dL (ref 0.70–1.18)
Glucose, Bld: 111 mg/dL — ABNORMAL HIGH (ref 65–99)
Potassium: 4.2 mmol/L (ref 3.5–5.3)
Sodium: 137 mmol/L (ref 135–146)

## 2016-02-06 LAB — BRAIN NATRIURETIC PEPTIDE: Brain Natriuretic Peptide: 143.9 pg/mL — ABNORMAL HIGH (ref <100)

## 2016-02-06 NOTE — Telephone Encounter (Signed)
New message      Calling to have someone fax the last ov note from Dr Aundra Dubin to the [place] at [phone] so that pt can get his presc refilled thru the [place].  They are there until 4

## 2016-02-06 NOTE — Telephone Encounter (Signed)
Note faxed. Confirmation of transmission received.

## 2016-02-06 NOTE — Telephone Encounter (Signed)
Spoke with pt and told him I will fax office note to [place] at fax number below. Will leave samples of Ranexa at front desk for him to pick up today.  Ranexa 500 mg, 14 tablets, lot [id number], exp 12/19 left at front desk

## 2016-02-17 ENCOUNTER — Telehealth: Payer: Self-pay | Admitting: Cardiology

## 2016-02-17 DIAGNOSIS — R0789 Other chest pain: Secondary | ICD-10-CM

## 2016-02-17 DIAGNOSIS — Z01818 Encounter for other preprocedural examination: Secondary | ICD-10-CM

## 2016-02-17 NOTE — Telephone Encounter (Signed)
I spoke with Travis Armstrong this afternoon about cath.  He has had on and off mild "swelling" in his chest.  It is not like prior ischemic symptoms.  I asked him to take nitroglycerin and if symptoms do not promptly resolve, he should come to the ER.  I am not sure this symptom is cardiac, but I told him that we need to be safe.

## 2016-02-17 NOTE — Telephone Encounter (Signed)
Pt said he was told to call back if he had need to. He said he wants you to tell Dr Aundra Dubin that he needs to talk to him.

## 2016-02-17 NOTE — Telephone Encounter (Signed)
Patient placed on Ranexa 500 mg BID for chest pressure last week after speaking with Dr Aundra Dubin regarding abnormal stress test results.Patient says he awakened this am to the worst pressure he has had to date.Says Ranexa only helps for a short while and has intermittent pain.wt today 198 lbs with good diuresis from Lasix QOD. Patient believes heart cath was discussed.will forward to Dr Aundra Dubin

## 2016-02-17 NOTE — Telephone Encounter (Signed)
Sound like he will need a cardiac cath.  Please arrange for me one day next week.  I will call him and discuss.

## 2016-02-17 NOTE — Telephone Encounter (Signed)
Scheduled left heart cath at [facility] with Dr.McLean on Wed, March 8th at 10 am,pt to arrive to short Stay at 8 am, lab orders placed,letter typed. I spoke with patient,he will go by [facility] office tomorrow,02/18/16 and have labs drawn and pick up instructions.Patient aware MD will call him later to discuss

## 2016-02-18 ENCOUNTER — Other Ambulatory Visit (INDEPENDENT_AMBULATORY_CARE_PROVIDER_SITE_OTHER): Payer: PPO | Admitting: *Deleted

## 2016-02-18 DIAGNOSIS — Z01818 Encounter for other preprocedural examination: Secondary | ICD-10-CM | POA: Diagnosis not present

## 2016-02-18 DIAGNOSIS — R0789 Other chest pain: Secondary | ICD-10-CM | POA: Diagnosis not present

## 2016-02-18 LAB — CBC WITH DIFFERENTIAL/PLATELET
BASOS ABS: 0 10*3/uL (ref 0.0–0.1)
BASOS PCT: 0 % (ref 0–1)
Eosinophils Absolute: 0.1 10*3/uL (ref 0.0–0.7)
Eosinophils Relative: 1 % (ref 0–5)
HCT: 38.9 % — ABNORMAL LOW (ref 39.0–52.0)
HEMOGLOBIN: 13.8 g/dL (ref 13.0–17.0)
Lymphocytes Relative: 21 % (ref 12–46)
Lymphs Abs: 1.4 10*3/uL (ref 0.7–4.0)
MCH: 32.9 pg (ref 26.0–34.0)
MCHC: 35.5 g/dL (ref 30.0–36.0)
MCV: 92.8 fL (ref 78.0–100.0)
MONOS PCT: 14 % — AB (ref 3–12)
MPV: 8.6 fL (ref 8.6–12.4)
Monocytes Absolute: 0.9 10*3/uL (ref 0.1–1.0)
NEUTROS ABS: 4.2 10*3/uL (ref 1.7–7.7)
NEUTROS PCT: 64 % (ref 43–77)
PLATELETS: 124 10*3/uL — AB (ref 150–400)
RBC: 4.19 MIL/uL — AB (ref 4.22–5.81)
RDW: 13.5 % (ref 11.5–15.5)
WBC: 6.5 10*3/uL (ref 4.0–10.5)

## 2016-02-18 LAB — BASIC METABOLIC PANEL
BUN: 15 mg/dL (ref 7–25)
CALCIUM: 9.1 mg/dL (ref 8.6–10.3)
CHLORIDE: 99 mmol/L (ref 98–110)
CO2: 25 mmol/L (ref 20–31)
Creat: 1.47 mg/dL — ABNORMAL HIGH (ref 0.70–1.18)
Glucose, Bld: 111 mg/dL — ABNORMAL HIGH (ref 65–99)
Potassium: 4.2 mmol/L (ref 3.5–5.3)
SODIUM: 133 mmol/L — AB (ref 135–146)

## 2016-02-18 LAB — PROTIME-INR
INR: 1.1 (ref <1.50)
PROTHROMBIN TIME: 14.3 s (ref 11.6–15.2)

## 2016-02-18 NOTE — Addendum Note (Signed)
Addended by: Eulis Foster on: 02/18/2016 09:52 AM   Modules accepted: Orders

## 2016-02-19 ENCOUNTER — Other Ambulatory Visit: Payer: Self-pay | Admitting: *Deleted

## 2016-02-19 MED ORDER — FUROSEMIDE 20 MG PO TABS: 10.0000 mg | ORAL_TABLET | ORAL | Status: DC

## 2016-02-24 ENCOUNTER — Other Ambulatory Visit (HOSPITAL_COMMUNITY): Payer: Self-pay | Admitting: Cardiology

## 2016-02-24 DIAGNOSIS — I208 Other forms of angina pectoris: Secondary | ICD-10-CM

## 2016-02-25 ENCOUNTER — Ambulatory Visit (HOSPITAL_COMMUNITY)
Admission: RE | Admit: 2016-02-25 | Discharge: 2016-02-25 | Disposition: A | Discharge status: Home / Self Care | Payer: PPO | Source: Ambulatory Visit | Attending: Cardiology | Admitting: Cardiology

## 2016-02-25 ENCOUNTER — Encounter (HOSPITAL_COMMUNITY)
Admission: RE | Disposition: A | Discharge status: Home / Self Care | Payer: Self-pay | Source: Ambulatory Visit | Attending: Cardiology

## 2016-02-25 DIAGNOSIS — Z951 Presence of aortocoronary bypass graft: Secondary | ICD-10-CM | POA: Diagnosis not present

## 2016-02-25 DIAGNOSIS — E785 Hyperlipidemia, unspecified: Secondary | ICD-10-CM | POA: Insufficient documentation

## 2016-02-25 DIAGNOSIS — Z87891 Personal history of nicotine dependence: Secondary | ICD-10-CM | POA: Diagnosis not present

## 2016-02-25 DIAGNOSIS — I2584 Coronary atherosclerosis due to calcified coronary lesion: Secondary | ICD-10-CM | POA: Insufficient documentation

## 2016-02-25 DIAGNOSIS — Q231 Congenital insufficiency of aortic valve: Secondary | ICD-10-CM | POA: Insufficient documentation

## 2016-02-25 DIAGNOSIS — I208 Other forms of angina pectoris: Secondary | ICD-10-CM

## 2016-02-25 DIAGNOSIS — I110 Hypertensive heart disease with heart failure: Secondary | ICD-10-CM | POA: Insufficient documentation

## 2016-02-25 DIAGNOSIS — Y712 Prosthetic and other implants, materials and accessory cardiovascular devices associated with adverse incidents: Secondary | ICD-10-CM | POA: Diagnosis not present

## 2016-02-25 DIAGNOSIS — I2510 Atherosclerotic heart disease of native coronary artery without angina pectoris: Secondary | ICD-10-CM | POA: Diagnosis not present

## 2016-02-25 DIAGNOSIS — I6523 Occlusion and stenosis of bilateral carotid arteries: Secondary | ICD-10-CM | POA: Diagnosis not present

## 2016-02-25 DIAGNOSIS — Z7982 Long term (current) use of aspirin: Secondary | ICD-10-CM | POA: Insufficient documentation

## 2016-02-25 DIAGNOSIS — Z8249 Family history of ischemic heart disease and other diseases of the circulatory system: Secondary | ICD-10-CM | POA: Diagnosis not present

## 2016-02-25 DIAGNOSIS — T82855D Stenosis of coronary artery stent, subsequent encounter: Secondary | ICD-10-CM | POA: Insufficient documentation

## 2016-02-25 DIAGNOSIS — Z953 Presence of xenogenic heart valve: Secondary | ICD-10-CM | POA: Insufficient documentation

## 2016-02-25 DIAGNOSIS — I5032 Chronic diastolic (congestive) heart failure: Secondary | ICD-10-CM | POA: Diagnosis not present

## 2016-02-25 DIAGNOSIS — I25119 Atherosclerotic heart disease of native coronary artery with unspecified angina pectoris: Secondary | ICD-10-CM | POA: Diagnosis not present

## 2016-02-25 DIAGNOSIS — I209 Angina pectoris, unspecified: Secondary | ICD-10-CM | POA: Diagnosis not present

## 2016-02-25 HISTORY — PX: CARDIAC CATHETERIZATION: SHX172

## 2016-02-25 LAB — BASIC METABOLIC PANEL
ANION GAP: 7 (ref 5–15)
BUN: 9 mg/dL (ref 6–20)
CALCIUM: 9.5 mg/dL (ref 8.9–10.3)
CO2: 28 mmol/L (ref 22–32)
Chloride: 102 mmol/L (ref 101–111)
Creatinine, Ser: 1.25 mg/dL — ABNORMAL HIGH (ref 0.61–1.24)
GFR, EST NON AFRICAN AMERICAN: 55 mL/min — AB (ref >60)
Glucose, Bld: 106 mg/dL — ABNORMAL HIGH (ref 65–99)
POTASSIUM: 5 mmol/L (ref 3.5–5.1)
SODIUM: 137 mmol/L (ref 135–145)

## 2016-02-25 SURGERY — LEFT HEART CATH AND CORONARY ANGIOGRAPHY

## 2016-02-25 MED ORDER — FENTANYL CITRATE (PF) 100 MCG/2ML IJ SOLN
INTRAMUSCULAR | Status: AC
  Filled 2016-02-25: qty 2

## 2016-02-25 MED ORDER — ACETAMINOPHEN 325 MG PO TABS: 650.0000 mg | ORAL_TABLET | ORAL | Status: DC | PRN

## 2016-02-25 MED ORDER — IOHEXOL 350 MG/ML SOLN
INTRAVENOUS | Status: DC | PRN
  Administered 2016-02-25: 90 mL via INTRA_ARTERIAL

## 2016-02-25 MED ORDER — SODIUM CHLORIDE 0.9 % IV SOLN: 250.0000 mL | INTRAVENOUS | Status: DC | PRN

## 2016-02-25 MED ORDER — MIDAZOLAM HCL 2 MG/2ML IJ SOLN
INTRAMUSCULAR | Status: DC | PRN
  Administered 2016-02-25: 1 mg via INTRAVENOUS

## 2016-02-25 MED ORDER — SODIUM CHLORIDE 0.9 % WEIGHT BASED INFUSION: 3.0000 mL/kg/h | INTRAVENOUS | Status: DC

## 2016-02-25 MED ORDER — HEPARIN SODIUM (PORCINE) 1000 UNIT/ML IJ SOLN
INTRAMUSCULAR | Status: DC | PRN
  Administered 2016-02-25: 5000 [IU] via INTRAVENOUS

## 2016-02-25 MED ORDER — HEPARIN SODIUM (PORCINE) 1000 UNIT/ML IJ SOLN
INTRAMUSCULAR | Status: AC
  Filled 2016-02-25: qty 1

## 2016-02-25 MED ORDER — HEPARIN (PORCINE) IN NACL 2-0.9 UNIT/ML-% IJ SOLN
INTRAMUSCULAR | Status: AC
  Filled 2016-02-25: qty 1500

## 2016-02-25 MED ORDER — SODIUM CHLORIDE 0.9% FLUSH: 3.0000 mL | INTRAVENOUS | Status: DC | PRN

## 2016-02-25 MED ORDER — SODIUM CHLORIDE 0.9% FLUSH: 3.0000 mL | Freq: Two times a day (BID) | INTRAVENOUS | Status: DC

## 2016-02-25 MED ORDER — ONDANSETRON HCL 4 MG/2ML IJ SOLN: 4.0000 mg | Freq: Four times a day (QID) | INTRAMUSCULAR | Status: DC | PRN

## 2016-02-25 MED ORDER — FENTANYL CITRATE (PF) 100 MCG/2ML IJ SOLN
INTRAMUSCULAR | Status: DC | PRN
  Administered 2016-02-25: 25 ug via INTRAVENOUS

## 2016-02-25 MED ORDER — LIDOCAINE HCL (PF) 1 % IJ SOLN
INTRAMUSCULAR | Status: DC | PRN
  Administered 2016-02-25: 10:00:00

## 2016-02-25 MED ORDER — LIDOCAINE HCL (PF) 1 % IJ SOLN
INTRAMUSCULAR | Status: AC
  Filled 2016-02-25: qty 30

## 2016-02-25 MED ORDER — SODIUM CHLORIDE 0.9 % WEIGHT BASED INFUSION
3.0000 mL/kg/h | INTRAVENOUS | Status: AC
  Administered 2016-02-25: 3 mL/kg/h via INTRAVENOUS

## 2016-02-25 MED ORDER — SODIUM CHLORIDE 0.9 % WEIGHT BASED INFUSION: 1.0000 mL/kg/h | INTRAVENOUS | Status: DC

## 2016-02-25 MED ORDER — ASPIRIN 81 MG PO CHEW: 81.0000 mg | CHEWABLE_TABLET | ORAL | Status: DC

## 2016-02-25 MED ORDER — SODIUM CHLORIDE 0.9% FLUSH
3.0000 mL | Freq: Two times a day (BID) | INTRAVENOUS | Status: DC
Start: 2016-02-25 — End: 2016-02-25

## 2016-02-25 MED ORDER — MIDAZOLAM HCL 2 MG/2ML IJ SOLN
INTRAMUSCULAR | Status: AC
  Filled 2016-02-25: qty 2

## 2016-02-25 MED ORDER — VERAPAMIL HCL 2.5 MG/ML IV SOLN
INTRAVENOUS | Status: DC | PRN
  Administered 2016-02-25: 10:00:00 via INTRA_ARTERIAL

## 2016-02-25 MED ORDER — VERAPAMIL HCL 2.5 MG/ML IV SOLN
INTRAVENOUS | Status: AC
  Filled 2016-02-25: qty 2

## 2016-02-25 SURGICAL SUPPLY — 10 items
CATH INFINITI 5 FR 3DRC (CATHETERS) ×2 IMPLANT
CATH INFINITI 5FR MULTPACK ANG (CATHETERS) ×2 IMPLANT
DEVICE RAD COMP TR BAND LRG (VASCULAR PRODUCTS) ×3 IMPLANT
GLIDESHEATH SLEND SS 6F .021 (SHEATH) ×2 IMPLANT
KIT HEART LEFT (KITS) ×3 IMPLANT
PACK CARDIAC CATHETERIZATION (CUSTOM PROCEDURE TRAY) ×3 IMPLANT
TRANSDUCER W/STOPCOCK (MISCELLANEOUS) ×3 IMPLANT
TUBING CIL FLEX 10 FLL-RA (TUBING) ×3 IMPLANT
WIRE HI TORQ VERSACORE-J 145CM (WIRE) ×2 IMPLANT
WIRE SAFE-T 1.5MM-J .035X260CM (WIRE) ×2 IMPLANT

## 2016-02-25 NOTE — Discharge Instructions (Signed)
Radial Site Care °Refer to this sheet in the next few weeks. These instructions provide you with information about caring for yourself after your procedure. Your health care provider may also give you more specific instructions. Your treatment has been planned according to current medical practices, but problems sometimes occur. Call your health care provider if you have any problems or questions after your procedure. °WHAT TO EXPECT AFTER THE PROCEDURE °After your procedure, it is typical to have the following: °· Bruising at the radial site that usually fades within 1-2 weeks. °· Blood collecting in the tissue (hematoma) that may be painful to the touch. It should usually decrease in size and tenderness within 1-2 weeks. °HOME CARE INSTRUCTIONS °· Take medicines only as directed by your health care provider. °· You may shower 24-48 hours after the procedure or as directed by your health care provider. Remove the bandage (dressing) and gently wash the site with plain soap and water. Pat the area dry with a clean towel. Do not rub the site, because this may cause bleeding. °· Do not take baths, swim, or use a hot tub until your health care provider approves. °· Check your insertion site every day for redness, swelling, or drainage. °· Do not apply powder or lotion to the site. °· Do not flex or bend the affected arm for 24 hours or as directed by your health care provider. °· Do not push or pull heavy objects with the affected arm for 24 hours or as directed by your health care provider. °· Do not lift over 10 lb (4.5 kg) for 5 days after your procedure or as directed by your health care provider. °· Ask your health care provider when it is okay to: °¨ Return to work or school. °¨ Resume usual physical activities or sports. °¨ Resume sexual activity. °· Do not drive home if you are discharged the same day as the procedure. Have someone else drive you. °· You may drive 24 hours after the procedure unless otherwise  instructed by your health care provider. °· Do not operate machinery or power tools for 24 hours after the procedure. °· If your procedure was done as an outpatient procedure, which means that you went home the same day as your procedure, a responsible adult should be with you for the first 24 hours after you arrive home. °· Keep all follow-up visits as directed by your health care provider. This is important. °SEEK MEDICAL CARE IF: °· You have a fever. °· You have chills. °· You have increased bleeding from the radial site. Hold pressure on the site. CALL 911 °SEEK IMMEDIATE MEDICAL CARE IF: °· You have unusual pain at the radial site. °· You have redness, warmth, or swelling at the radial site. °· You have drainage (other than a small amount of blood on the dressing) from the radial site. °· The radial site is bleeding, and the bleeding does not stop after 30 minutes of holding steady pressure on the site. °· Your arm or hand becomes pale, cool, tingly, or numb. °  °This information is not intended to replace advice given to you by your health care provider. Make sure you discuss any questions you have with your health care provider. °  °Document Released: 01/08/2011 Document Revised: 12/27/2014 Document Reviewed: 06/24/2014 °Elsevier Interactive Patient Education ©2016 [employer] Inc[employer] ° °

## 2016-02-25 NOTE — Progress Notes (Signed)
Patient Information: 1. Patients With Known Obstructive CAD (e.g., Prior MI, Prior PCI, Prior CABG, or Obstructive Disease on Invasive Angiography) 2. Post Revascularization (PCI or CABG) 3. Intermediate-risk noninvasive findings 4. Worsening or limiting symptoms A (7) Indication: 54;  Travis Armstrong 02/25/2016

## 2016-02-25 NOTE — Interval H&P Note (Signed)
Cath Lab Visit (complete for each Cath Lab visit)  Clinical Evaluation Leading to the Procedure:   ACS: No.  Non-ACS:    Anginal Classification: CCS III  Anti-ischemic medical therapy: Minimal Therapy (1 class of medications)  Non-Invasive Test Results: Intermediate-risk stress test findings: cardiac mortality 1-3%/year  Prior CABG: Yes      History and Physical Interval Note:  02/25/2016 10:14 AM  Travis Armstrong  has presented today for surgery, with the diagnosis of angina  The various methods of treatment have been discussed with the patient and family. After consideration of risks, benefits and other options for treatment, the patient has consented to  Procedure(s): Left Heart Cath and Coronary Angiography (N/A) as a surgical intervention .  The patient's history has been reviewed, patient examined, no change in status, stable for surgery.  I have reviewed the patient's chart and labs.  Questions were answered to the patient's satisfaction.     Travis Armstrong

## 2016-02-25 NOTE — H&P (View-Only) (Signed)
Patient ID: Travis Armstrong, male   DOB: [birth year], 75 y.o.   MRN: [id number] PCP: Dr. Redmond Pulling  75 yo with history of HTN presented initially for evaluation of chest pain in 2011.  It occurred with splitting wood and after walking about 1/4 mile on flat ground.  Given these exertional symptoms, I set him up for an ETT-myoview. This showed evidence for ischemia by exercise ECG, and there was a small reversible apical perfusion defect possibly suggestive of ischemia as well.  Echo to assess for cause of systolic murmur showed a bicuspid aortic valve with preserved LV systolic function and no significant aortic stenosis.  I set him up for a left heart cath in 1/12, which showed heavy calcification in the LAD.  There was a long 90% mid LAD stenosis.  The LAD was small at this point, a 2-2.5 mm vessel.  I decided to proceed with medical treatment as his symptoms were only with moderate exertion and as the LAD was a small caliber vessel at the site of the stenosis.  Given the bicuspid aortic valve, I did an MR angiogram of the thoracic aorta, which ruled out aortic aneurysm.  In the fall of 2012, he had recurrent chest pain and I repeated a cath, this showed worsening of the mid LAD stenosis up to 95%.  He underwent PCI with Promus DES to the mid LAD in 12/12.  In the fall of 2013, he again developed exertional chest pain.  I did a left heart cath in 10/13 showing 99% stenosis in the proximal LAD just proximal to the stent.  He had a Promus DES placed to the proximal LAD.  In 1/15, patient had echo concerning for moderate-severe AS (progression).  TEE was done to assess more closely, this was more consistent with moderate AS. Patient again developed exertional chest pain in 4/15 and had LHC showing 80% mLAD in-stent restenosis.  Echo was done again in 4/15, suggestive of moderate to severe AS.  Patient was then taken for LIMA-LAD with bioprosthetic AVR by Dr. Roxy Manns in 5/15.  Post-op echo in 6/15 showed normal EF and  well-seated bioprosthetic aortic valve.   For a couple of months, he has been having left lateral chest tightness (feels like a "swelling").  He notes it when he is lifting something or using his arms to work.  He has not noted it with walking.  Sometimes he will have episodes at rest but it seems to come on most with exertion.  He has had a tender spot on his left lateral chest.  He has some shortness of breath with heavy exertion like walking up a hill and feels like he is carrying excess fluid. He has bilateral ankle edema.  He saw Estella Husk and was given a prescription for ranolazine and ETT-Cardiolite was ordered. He had no chest pain while walking on the treadmill.  Cardiolite result was not available at his office visit today, but review after the office visit showed small reversible basal to mid inferolateral perfusion defect concerning for ischemia.  Echo (1/17) from the [place] showed EF 65% and normal bioprosthetic aortic valve. He never started the ranolazine.   Labs (10/11): LDL 113, HDL 46, K 5.1, creatinine 1.1, LFTs normal Labs (1/12): K 5.3, creatinine 1.1 Labs (3/12): LDL 51, HDL 37 Labs (12/12): K 4.1, creatinine 0.98 Labs (4/13): LDL 53, HDL 45 Labs (9/13): LDL 54, HDL 46, LFTs normal, TSH normal, K 4.7, creatinine 0.9 Labs (3/14): LDL 36, HDL 38 Labs (  10/14): K 4.5, creatinine 1.1, AST 40, ALT 42, LDL 27, HDL 44 Labs (6/15): K 4, creatinine 1.1, HCT 55 Labs (8/15): K 4.9, creatinine 1.2, LDL 32 Labs (10/15): K 4, creatinine 1.2  Allergies (verified):  1)  ! Pcn  Past Medical History: 1. Actinic Keratosis 2. Muscle strains  3. HTN 4. CAD: Anginal-type chest pain.  ETT-myoview (1/12) showed good exercise tolerance but also inferior and V5/V6 1 mm ST depression and chest pain with exercise.  Perfusion images showed reversible apical perfusion defect.  Findings suggested ischemia.  LHC (1/12): LAD heavily calcified with 90% calcified mid LAD stenosis after D2.  The LAD was  small caliber (2-2.5 mm) at this point.  Because of this and his lack of unstable symptoms, I elected to manage him medically initially.  ETT-myoview (11/12): 9'30" exercise with mild chest pain and ischemic ST changes.  There was a small reversible apical perfusion defect, similar to the 1/12 study.  LHC repeated 12/12 with 95% mid LAD stenosis after D2.  Promus DES to mid LAD.  Exertional chest pain recurred with LHC done again in 10/13.  There was 99% proximal LAD stenosis just proximal to the stent.  Patient received a Promus DES.   LHC (4/15) with 80% mLAD in-stent restenosis and small to moderate D2 with 90% proximal stenosis.  Patient had LIMA-LAD along with AVR in 5/15.  ETT-Cardiolite (2/17) with EF 59%, 8'23" exercise to 10.1 METS with no chest pain, small reversible basal to mid inferolateral perfusion defect.  5. Bicuspid aortic valve with mild-moderate AS: Echo (1/12) showed EF 60-65%, bicuspid aortic valve with minimal stenosis (mean gradient 9 mmHg), grade II diastolic dysfunction, mild MR, mild LAE.  MR angiogram chest (12/12): No thoracic aortic aneurysm.  Echo (1/14) showed EF 65-70% with mild to moderate AS, mean gradient 22 mmHg.  Echo (1/15) with moderate to severe AS, mean gradient 36, peak 55, AVA 1.0 cm^2.  TEE (1/15) with EF 60-65%, bicuspid aortic valve, AVA 1.0 cm^2 with mean gradient 26 mmHg, peak 48 mmHg, moderate MR, RV normal.  Echo (4/15) with EF 60-65%, moderate LVH, moderate to severe AS with mean gradient 35 mmHg and AVA 0.7 cm^2.  Patient had bioprosthetic AVR in 5/15.  Echo post-op in 6/15 with EF 55-60%, normal bioprosthetic aortic valve. Echo (1/17) with EF 65%, normal RV size and systolic function, normal bioprosthetic aortic valve.  6. Carotid dopplers (1/12): No significant stenosis.  Carotid dopplers (10/14) with mild bilateral ICA stenosis.  7.  Hyperlipidemia: Myalgias with Lipitor.  8.  Mild bradycardia  Family History: 2 brothers with CAD diagnosed in their 100s.   Sister with diabetes, died of complications from this disease.   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    Current Outpatient Prescriptions  Medication Sig Dispense Refill  . aspirin 81 MG tablet Take 81 mg by mouth daily.     Marland Kitchen atorvastatin (LIPITOR) 80 MG tablet Take 40 mg by mouth daily.    . calcium carbonate (OS-CAL) 600 MG TABS tablet Take 600 mg by mouth daily with breakfast.    . gabapentin (NEURONTIN) 300 MG capsule Take 300 mg by mouth 3 (three) times daily.    Marland Kitchen ipratropium (ATROVENT) 0.03 % nasal spray Place 1 spray into both nostrils 2 (two) times daily.    Marland Kitchen  lisinopril (PRINIVIL,ZESTRIL) 10 MG tablet Take 1 tablet (10 mg total) by mouth daily. 90 tablet 3  . loratadine (CLARITIN) 10 MG tablet Take 10 mg by mouth daily.     . magnesium gluconate (MAGONATE) 500 MG tablet Take 500 mg by mouth daily.     . metoprolol tartrate (LOPRESSOR) 25 MG tablet Take 12.5 mg by mouth 2 (two) times daily.     . Multiple Vitamins-Minerals (PRESERVISION AREDS 2) CAPS Take 2 capsules by mouth daily.    . nitroGLYCERIN (NITROSTAT) 0.4 MG SL tablet Place 1 tablet (0.4 mg total) under the tongue every 5 (five) minutes as needed for chest pain. 90 tablet 3  . Omega-3 Fatty Acids (FISH OIL PO) Take 1 capsule by mouth daily.     . ranolazine (RANEXA) 500 MG 12 hr tablet Take 1 tablet (500 mg total) by mouth 2 (two) times daily. 60 tablet 11  . furosemide (LASIX) 20 MG tablet Take 1 tablet (20 mg total) by mouth every other day. 45 tablet 1  . potassium chloride (K-DUR) 10 MEQ tablet Take 1 tablet (10 mEq total) by mouth every other day. 45 tablet 1   No current facility-administered medications for this visit.    BP 136/58 mmHg  Pulse 68  Ht 5\' 11"  (1.803 m)  Wt 208 lb (94.348 kg)  BMI 29.02  kg/m2 General:  Well developed, well nourished, in no acute distress. Neck:  Neck supple, JVP 7-8 cm. No masses, thyromegaly or abnormal cervical nodes. Lungs:  Slight decreased breath sounds right base.  Heart:  Non-displaced PMI, chest non-tender; regular rate and rhythm, S1, S2.  1/6 early SEM. +S4. Carotid upstroke normal.  Pedals normal pulses. 1+ ankle edema.  Right radial pulse weaker than left radial.  Abdomen:  Bowel sounds positive; abdomen soft and non-tender without masses, organomegaly, or hernias noted. No hepatosplenomegaly. Extremities:  No clubbing or cyanosis. Neurologic:  Alert and oriented x 3. Psych:  Normal affect.  Assessment/Plan:  AORTIC VALVE DISORDERS Bicuspid aortic valve with moderate-severe AS and no associated thoracic aortic aneurysm now s/p bioprosthetic AVR.  The valve was well-seated on 1/17 echo.  Coronary artery disease  Status post PCI with DES to proximal LAD in 12/12 and again in 10/13 due to in-stent restenosis.  He had recurrent in-stent restenosis of LAD stent in 4/15, now s/p LIMA-LAD.  He has had recurrent primarily exertional chest tightness over the last couple of months.  It is relatively mild.  ETT-Cardiolite was done yesterday with good exercise tolerance (10.1 METS) and no CP on the treadmill, but there was a small area of basal to mid inferolateral ischemia. I think that this is overall a low risk study and medical management initially would be appropriate (I did not get full study report until after patient had left).  - Continue ASA 81, ACEI, metoprolol, and statin.  - He cannot take Imdur due to headaches. - I started him on ranolazine 500 mg bid which seems to be a reasonable first step (medical management).  If he has exertional chest pain despite the ranolazine, it would be reasonable to do angiography. He knows to come to the ER with prolonged pain.  - I will reassess him in about 3 wks.  He will call if chest pain worsens.   HYPERLIPIDEMIA I will call [facility] for recent lipids. Chronic diastolic CHF Possible mild volume overload.  He feels like he is carrying fluid. I will let him take Lasix 20 mg + KCl 10  mEq every other day.  HTN BP controlled.  Loralie Champagne 02/05/2016

## 2016-02-25 NOTE — Progress Notes (Signed)
TR Band removed, site observed,  2x2 and Tegaderm applied.  Less than 5 min small hematoma observed at puncture site; pressure held x 10 minutes; site redressed; Dr. Aundra Dubin notified, pt. to  remain for additional 45 minutes for observation

## 2016-02-26 ENCOUNTER — Encounter (HOSPITAL_COMMUNITY): Payer: Self-pay | Admitting: Cardiology

## 2016-02-26 MED FILL — Nitroglycerin IV Soln 100 MCG/ML in D5W: INTRA_ARTERIAL | Qty: 10 | Status: CN

## 2016-03-02 ENCOUNTER — Ambulatory Visit (INDEPENDENT_AMBULATORY_CARE_PROVIDER_SITE_OTHER): Payer: PPO | Admitting: Cardiology

## 2016-03-02 ENCOUNTER — Encounter: Payer: Self-pay | Admitting: Cardiology

## 2016-03-02 ENCOUNTER — Encounter: Payer: Self-pay | Admitting: *Deleted

## 2016-03-02 VITALS — BP 134/62 | HR 60 | Ht 71.0 in | Wt 213.0 lb

## 2016-03-02 DIAGNOSIS — Z954 Presence of other heart-valve replacement: Secondary | ICD-10-CM

## 2016-03-02 DIAGNOSIS — Z951 Presence of aortocoronary bypass graft: Secondary | ICD-10-CM

## 2016-03-02 DIAGNOSIS — Z953 Presence of xenogenic heart valve: Secondary | ICD-10-CM

## 2016-03-02 DIAGNOSIS — I209 Angina pectoris, unspecified: Secondary | ICD-10-CM | POA: Diagnosis not present

## 2016-03-02 MED ORDER — AMLODIPINE BESYLATE 2.5 MG PO TABS: 2.5000 mg | ORAL_TABLET | Freq: Every day | ORAL | Status: DC

## 2016-03-02 NOTE — Patient Instructions (Addendum)
Medication Instructions:  Your physician has recommended you make the following change in your medication:  Take amlodipine 2.5 mg by mouth every morning    Labwork: none  Testing/Procedures: none  Follow-Up: Your physician wants you to follow-up in: 4 months.  You will receive a reminder letter in the mail two months in advance. If you don't receive a letter, please call our office to schedule the follow-up appointment.   Any Other Special Instructions Will Be Listed Below (If Applicable).     If you need a refill on your cardiac medications before your next appointment, please call your pharmacy.

## 2016-03-02 NOTE — Progress Notes (Signed)
Patient ID: Travis Armstrong, male   DOB: Jul 28, 1941, 75 y.o.   MRN: [id number] PCP: Dr. Redmond Armstrong  74 yo with history of HTN presented initially for evaluation of chest pain in 2011.  It occurred with splitting wood and after walking about 1/4 mile on flat ground.  Given these exertional symptoms, I set him up for an ETT-myoview. This showed evidence for ischemia by exercise ECG, and there was a small reversible apical perfusion defect possibly suggestive of ischemia as well.  Echo to assess for cause of systolic murmur showed a bicuspid aortic valve with preserved LV systolic function and no significant aortic stenosis.  I set him up for a left heart cath in 1/12, which showed heavy calcification in the LAD.  There was a long 90% mid LAD stenosis.  The LAD was small at this point, a 2-2.5 mm vessel.  I decided to proceed with medical treatment as his symptoms were only with moderate exertion and as the LAD was a small caliber vessel at the site of the stenosis.  Given the bicuspid aortic valve, I did an MR angiogram of the thoracic aorta, which ruled out aortic aneurysm.  In the fall of 2012, he had recurrent chest pain and I repeated a cath, this showed worsening of the mid LAD stenosis up to 95%.  He underwent PCI with Promus DES to the mid LAD in 12/12.  In the fall of 2013, he again developed exertional chest pain.  I did a left heart cath in 10/13 showing 99% stenosis in the proximal LAD just proximal to the stent.  He had a Promus DES placed to the proximal LAD.  In 1/15, patient had echo concerning for moderate-severe AS (progression).  TEE was done to assess more closely, this was more consistent with moderate AS. Patient again developed exertional chest pain in 4/15 and had LHC showing 80% mLAD in-stent restenosis.  Echo was done again in 4/15, suggestive of moderate to severe AS.  Patient was then taken for LIMA-LAD with bioprosthetic AVR by Dr. Roxy Armstrong in 5/15.  Post-op echo in 6/15 showed normal EF and  well-seated bioprosthetic aortic valve.   Prior to last appointment with me, he reported mild exertional chest tightness.  He saw Travis Armstrong and was given a prescription for Travis Armstrong and ETT-Cardiolite was ordered. He had no chest pain while walking on the treadmill.  Cardiolite showed small reversible basal to mid inferolateral perfusion defect concerning for ischemia.  Echo (1/17) from the [place] showed EF 65% and normal bioprosthetic aortic valve. He tried Travis Armstrong but had side effects so stopped it.  He continued to have chest pain, so I did a cardiac cath in 3/17.  This showed patent ramus, RCA, and LIMA-LAD.  There was 90% ostial stenosis in small AV LCx (most of lateral wall territory was provided by the ramus).  Given small territory and difficult position for PCI, I chose medical management.  He has been unable to tolerate Travis Armstrong, Travis Armstrong, or higher dose metoprolol, so I added amlodipine 2.5 daily to his regimen to try to help with angina.   He continues to have mild chest tightness, usually with heavier exertion such as raking, vacuuming.  No problems bowling last night.  Very mild tightness walking up a hill.  Overall, seems improved but still present.  No orthopnea/PND.  No NTG use.  Dyspnea also with heavier exertion.     Labs (10/11): LDL 113, HDL 46, K 5.1, creatinine 1.1, LFTs normal Labs (1/12): K  5.3, creatinine 1.1 Labs (3/12): LDL 51, HDL 37 Labs (12/12): K 4.1, creatinine 0.98 Labs (4/13): LDL 53, HDL 45 Labs (9/13): LDL 54, HDL 46, LFTs normal, TSH normal, K 4.7, creatinine 0.9 Labs (3/14): LDL 36, HDL 38 Labs (10/14): K 4.5, creatinine 1.1, AST 40, ALT 42, LDL 27, HDL 44 Labs (6/15): K 4, creatinine 1.1, HCT 55 Labs (8/15): K 4.9, creatinine 1.2, LDL 32 Labs (10/15): K 4, creatinine 1.2 Labs (2/17): LDL 22 Labs (3/17): K 5, creatinine 1.25  Allergies (verified):  1)  ! Pcn  Past Medical History: 1. Actinic Keratosis 2. Muscle strains  3. HTN 4. CAD:  Anginal-type chest pain.  ETT-myoview (1/12) showed good exercise tolerance but also inferior and V5/V6 1 mm ST depression and chest pain with exercise.  Perfusion images showed reversible apical perfusion defect.  Findings suggested ischemia.  LHC (1/12): LAD heavily calcified with 90% calcified mid LAD stenosis after D2.  The LAD was small caliber (2-2.5 mm) at this point.  Because of this and his lack of unstable symptoms, I elected to manage him medically initially.  ETT-myoview (11/12): 9'30" exercise with mild chest pain and ischemic ST changes.  There was a small reversible apical perfusion defect, similar to the 1/12 study.  LHC repeated 12/12 with 95% mid LAD stenosis after D2.  Promus DES to mid LAD.  Exertional chest pain recurred with LHC done again in 10/13.  There was 99% proximal LAD stenosis just proximal to the stent.  Patient received a Promus DES.   LHC (4/15) with 80% mLAD in-stent restenosis and small to moderate D2 with 90% proximal stenosis.  Patient had LIMA-LAD along with AVR in 5/15.  ETT-Cardiolite (2/17) with EF 59%, 8'23" exercise to 10.1 METS with no chest pain, small reversible basal to mid inferolateral perfusion defect.  LHC (3/17) with patent ramus, RCA, and LIMA-LAD; 90% ostial small AV LCx (most of lateral wall covered by ramus), jailed small D2 (90% ostial stenosis), distal LAD disease => medical management.  5. Bicuspid aortic valve with mild-moderate AS: Echo (1/12) showed EF 60-65%, bicuspid aortic valve with minimal stenosis (mean gradient 9 mmHg), grade II diastolic dysfunction, mild MR, mild LAE.  MR angiogram chest (12/12): No thoracic aortic aneurysm.  Echo (1/14) showed EF 65-70% with mild to moderate AS, mean gradient 22 mmHg.  Echo (1/15) with moderate to severe AS, mean gradient 36, peak 55, AVA 1.0 cm^2.  TEE (1/15) with EF 60-65%, bicuspid aortic valve, AVA 1.0 cm^2 with mean gradient 26 mmHg, peak 48 mmHg, moderate MR, RV normal.  Echo (4/15) with EF 60-65%,  moderate LVH, moderate to severe AS with mean gradient 35 mmHg and AVA 0.7 cm^2.  Patient had bioprosthetic AVR in 5/15.  Echo post-op in 6/15 with EF 55-60%, normal bioprosthetic aortic valve. Echo (1/17) with EF 65%, normal RV size and systolic function, normal bioprosthetic aortic valve.  6. Carotid dopplers (1/12): No significant stenosis.  Carotid dopplers (10/14) with mild bilateral ICA stenosis. Carotid dopplers (10/16) showed no significant stenosis.  7.  Hyperlipidemia: Myalgias with Lipitor.  8.  Mild bradycardia  Family History: 2 brothers with CAD diagnosed in their 68s.  Sister with diabetes, died of complications from this disease.   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    Current Outpatient  Prescriptions  Medication Sig Dispense Refill  . aspirin 81 MG tablet Take 81 mg by mouth daily.     Marland Kitchen atorvastatin (LIPITOR) 80 MG tablet Take 40 mg by mouth daily.    . calcium carbonate (OS-CAL) 600 MG TABS tablet Take 600 mg by mouth daily with breakfast.    . furosemide (LASIX) 20 MG tablet Take 0.5 tablets (10 mg total) by mouth every other day. 45 tablet 1  . gabapentin (NEURONTIN) 300 MG capsule Take 300 mg by mouth 3 (three) times daily.    Marland Kitchen ipratropium (ATROVENT) 0.03 % nasal spray Place 1 spray into both nostrils 2 (two) times daily.    Marland Kitchen lisinopril (PRINIVIL,ZESTRIL) 10 MG tablet Take 1 tablet (10 mg total) by mouth daily. 90 tablet 3  . loratadine (CLARITIN) 10 MG tablet Take 10 mg by mouth daily.     . magnesium gluconate (MAGONATE) 500 MG tablet Take 500 mg by mouth daily.     . metoprolol tartrate (LOPRESSOR) 25 MG tablet Take 12.5 mg by mouth 2 (two) times daily.     . Multiple Vitamins-Minerals (PRESERVISION AREDS 2) CAPS Take 2 capsules by mouth daily.    . nitroGLYCERIN  (NITROSTAT) 0.4 MG SL tablet Place 1 tablet (0.4 mg total) under the tongue every 5 (five) minutes as needed for chest pain. 90 tablet 3  . Omega-3 Fatty Acids (FISH OIL PO) Take 1 capsule by mouth daily.     . potassium chloride (K-DUR) 10 MEQ tablet Take 1 tablet (10 mEq total) by mouth every other day. 45 tablet 1  . amLODipine (NORVASC) 2.5 MG tablet Take 1 tablet (2.5 mg total) by mouth daily. 30 tablet 11   No current facility-administered medications for this visit.    BP 134/62 mmHg  Pulse 60  Ht 5\' 11"  (1.803 m)  Wt 213 lb (96.616 kg)  BMI 29.72 kg/m2 General:  Well developed, well nourished, in no acute distress. Neck:  Neck supple, JVP 7 cm. No masses, thyromegaly or abnormal cervical nodes. Lungs:  Slight decreased breath sounds right base.  Heart:  Non-displaced PMI, chest non-tender; regular rate and rhythm, S1, S2.  1/6 early SEM. +S4. Carotid upstroke normal.  Pedals normal pulses. 1+ ankle edema.   Abdomen:  Bowel sounds positive; abdomen soft and non-tender without masses, organomegaly, or hernias noted. No hepatosplenomegaly. Extremities:  No clubbing or cyanosis. Neurologic:  Alert and oriented x 3. Psych:  Normal affect.  Assessment/Plan:  AORTIC VALVE DISORDERS Bicuspid aortic valve with moderate-severe AS and no associated thoracic aortic aneurysm now s/p bioprosthetic AVR.  The valve was well-seated on 1/17 echo.  Coronary artery disease  Status post PCI with DES to proximal LAD in 12/12 and again in 10/13 due to in-stent restenosis.  He had recurrent in-stent restenosis of LAD stent in 4/15, now s/p LIMA-LAD.  He has had recurrent primarily exertional chest tightness over the last couple of months.  It is relatively mild.  ETT-Cardiolite was showed good exercise tolerance (10.1 METS) and no CP on the treadmill, but there was a small area of basal to mid inferolateral ischemia. Given ongoing chest pain and anxiety, I did a left heart cath. This showed patent  LIMA-LAD but 90% ostial AV LCx.  The ramus had minimal disease and provided flow to most of the lateral wall.  The AV LCx served only a small territory and would be a very difficult vessel to intervene upon.  Therefore, we opted to medically manage him.  -  Continue ASA 81, ACEI, metoprolol, and statin.  - He cannot take Travis Armstrong due to headaches and also had side effects from Travis Armstrong.  Unable to increase metoprolol further due to bradycardia and lightheadedness. - Continue amlodipine but take in the morning instead of at night.  HYPERLIPIDEMIA Good lipids recently from [place].  Chronic diastolic CHF I do not think that he is volume overloaded.  He can continue Lasix 10 mg every other day.   HTN BP controlled.  Loralie Champagne 03/02/2016

## 2016-03-24 ENCOUNTER — Telehealth: Payer: Self-pay | Admitting: Cardiology

## 2016-03-24 NOTE — Telephone Encounter (Signed)
Letter can be reworded as he requests.

## 2016-03-24 NOTE — Telephone Encounter (Signed)
The pt is aware that his letter has been re typed with "Unable to obtain gainful employment and sedentary duties full time" added. Per his request I have mailed the letter to his address.

## 2016-03-24 NOTE — Telephone Encounter (Signed)
I spoke with the pt and he went to turn in his note in regards to disability and they requested that Dr Aundra Dubin redo this letter to include that the pt is "Unable to obtain gainful employment and sedentary duties full time".  I made the pt aware that I will need to send this information to Dr Aundra Dubin to review and then redo letter if appropriate.  The pt would like to be contacted when letter is complete.

## 2016-03-24 NOTE — Telephone Encounter (Signed)
New Message:    Dr Aundra Dubin wrote him a letter for disability. He wants him to word it a little different.

## 2016-05-28 ENCOUNTER — Telehealth: Payer: Self-pay | Admitting: Cardiology

## 2016-05-28 NOTE — Telephone Encounter (Signed)
New Message  Pt calling stating he requested Medical Records from receive cath 3/8 and has not received them as of yet. Pease call back to discuss

## 2016-05-28 NOTE — Telephone Encounter (Signed)
Left patient VM to return my call.  

## 2016-08-27 ENCOUNTER — Ambulatory Visit (INDEPENDENT_AMBULATORY_CARE_PROVIDER_SITE_OTHER): Payer: PPO | Admitting: Cardiology

## 2016-08-27 ENCOUNTER — Encounter (INDEPENDENT_AMBULATORY_CARE_PROVIDER_SITE_OTHER): Payer: Self-pay

## 2016-08-27 ENCOUNTER — Encounter: Payer: Self-pay | Admitting: Cardiology

## 2016-08-27 VITALS — BP 118/56 | HR 56 | Ht 71.0 in | Wt 210.0 lb

## 2016-08-27 DIAGNOSIS — R5383 Other fatigue: Secondary | ICD-10-CM | POA: Diagnosis not present

## 2016-08-27 DIAGNOSIS — I2510 Atherosclerotic heart disease of native coronary artery without angina pectoris: Secondary | ICD-10-CM | POA: Diagnosis not present

## 2016-08-27 DIAGNOSIS — I359 Nonrheumatic aortic valve disorder, unspecified: Secondary | ICD-10-CM

## 2016-08-27 DIAGNOSIS — E785 Hyperlipidemia, unspecified: Secondary | ICD-10-CM

## 2016-08-27 DIAGNOSIS — Z954 Presence of other heart-valve replacement: Secondary | ICD-10-CM

## 2016-08-27 DIAGNOSIS — Z953 Presence of xenogenic heart valve: Secondary | ICD-10-CM

## 2016-08-27 DIAGNOSIS — I25110 Atherosclerotic heart disease of native coronary artery with unstable angina pectoris: Secondary | ICD-10-CM

## 2016-08-27 LAB — CBC
HEMATOCRIT: 36.8 % — AB (ref 38.5–50.0)
HEMOGLOBIN: 12.6 g/dL — AB (ref 13.2–17.1)
MCH: 31.8 pg (ref 27.0–33.0)
MCHC: 34.2 g/dL (ref 32.0–36.0)
MCV: 92.9 fL (ref 80.0–100.0)
MPV: 9.3 fL (ref 7.5–12.5)
Platelets: 136 10*3/uL — ABNORMAL LOW (ref 140–400)
RBC: 3.96 MIL/uL — AB (ref 4.20–5.80)
RDW: 14.1 % (ref 11.0–15.0)
WBC: 8.1 10*3/uL (ref 3.8–10.8)

## 2016-08-27 LAB — COMPREHENSIVE METABOLIC PANEL
ALBUMIN: 3.9 g/dL (ref 3.6–5.1)
ALT: 30 U/L (ref 9–46)
AST: 39 U/L — ABNORMAL HIGH (ref 10–35)
Alkaline Phosphatase: 78 U/L (ref 40–115)
BUN: 18 mg/dL (ref 7–25)
CALCIUM: 9.4 mg/dL (ref 8.6–10.3)
CHLORIDE: 100 mmol/L (ref 98–110)
CO2: 25 mmol/L (ref 20–31)
Creat: 1.26 mg/dL — ABNORMAL HIGH (ref 0.70–1.18)
Glucose, Bld: 97 mg/dL (ref 65–99)
POTASSIUM: 3.9 mmol/L (ref 3.5–5.3)
Sodium: 133 mmol/L — ABNORMAL LOW (ref 135–146)
TOTAL PROTEIN: 6.1 g/dL (ref 6.1–8.1)
Total Bilirubin: 0.6 mg/dL (ref 0.2–1.2)

## 2016-08-27 LAB — LIPID PANEL
CHOL/HDL RATIO: 2.1 ratio (ref <=5.0)
CHOLESTEROL: 89 mg/dL — AB (ref 125–200)
HDL: 43 mg/dL (ref >=40)
LDL Cholesterol: 25 mg/dL (ref <130)
Triglycerides: 106 mg/dL (ref <150)
VLDL: 21 mg/dL (ref <30)

## 2016-08-27 MED ORDER — LISINOPRIL 5 MG PO TABS
5.0000 mg | ORAL_TABLET | Freq: Every day | ORAL | 11 refills | Status: DC
Start: 2016-08-27 — End: 2016-11-02

## 2016-08-27 NOTE — Patient Instructions (Signed)
Medication Instructions:  Your physician has recommended you make the following change in your medication: Decrease lisinopril to 5 mg by mouth daily.     Labwork: Lab work to be done today--CBC, CMET, TSH, Lipid profile  Testing/Procedures: Your physician has requested that you have an echocardiogram. Echocardiography is a painless test that uses sound waves to create images of your heart. It provides your doctor with information about the size and shape of your heart and how well your heart's chambers and valves are working. This procedure takes approximately one hour. There are no restrictions for this procedure.    Follow-Up: Your physician recommends that you schedule a follow-up appointment in: 2 months.     Any Other Special Instructions Will Be Listed Below (If Applicable).     If you need a refill on your cardiac medications before your next appointment, please call your pharmacy.

## 2016-08-28 LAB — TSH: TSH: 2.31 m[IU]/L (ref 0.40–4.50)

## 2016-08-29 NOTE — Progress Notes (Signed)
Patient ID: Travis Armstrong, male   DOB: 1941-07-19, 75 y.o.   MRN: [id number] PCP: Dr. Redmond Armstrong  75 yo with history of HTN presented initially for evaluation of chest pain in 2011.  It occurred with splitting wood and after walking about 1/4 mile on flat ground.  Given these exertional symptoms, I set him up for an ETT-myoview. This showed evidence for ischemia by exercise ECG, and there was a small reversible apical perfusion defect possibly suggestive of ischemia as well.  Echo to assess for cause of systolic murmur showed a bicuspid aortic valve with preserved LV systolic function and no significant aortic stenosis.  I set him up for a left heart cath in 1/12, which showed heavy calcification in the LAD.  There was a long 90% mid LAD stenosis.  The LAD was small at this point, a 2-2.5 mm vessel.  I decided to proceed with medical treatment as his symptoms were only with moderate exertion and as the LAD was a small caliber vessel at the site of the stenosis.  Given the bicuspid aortic valve, I did an MR angiogram of the thoracic aorta, which ruled out aortic aneurysm.  In the fall of 2012, he had recurrent chest pain and I repeated a cath, this showed worsening of the mid LAD stenosis up to 95%.  He underwent PCI with Promus DES to the mid LAD in 12/12.  In the fall of 2013, he again developed exertional chest pain.  I did a left heart cath in 10/13 showing 99% stenosis in the proximal LAD just proximal to the stent.  He had a Promus DES placed to the proximal LAD.  In 1/15, patient had echo concerning for moderate-severe AS (progression).  TEE was done to assess more closely, this was more consistent with moderate AS. Patient again developed exertional chest pain in 4/15 and had LHC showing 80% mLAD in-stent restenosis.  Echo was done again in 4/15, suggestive of moderate to severe AS.  Patient was then taken for LIMA-LAD with bioprosthetic AVR by Dr. Roxy Armstrong in 5/15.  Post-op echo in 6/15 showed normal EF and  well-seated bioprosthetic aortic valve.   In 2/17, he reported mild exertional chest tightness.  He saw Travis Armstrong and was given a prescription for ranolazine and ETT-Cardiolite was done in 2/17. He had no chest pain while walking on the treadmill.  Cardiolite showed small reversible basal to mid inferolateral perfusion defect concerning for ischemia.  Echo (1/17) from the [place] showed EF 65% and normal bioprosthetic aortic valve. He tried ranolazine but had side effects so stopped it.  He continued to have chest pain, so I did a cardiac cath in 3/17.  This showed patent ramus, RCA, and LIMA-LAD.  There was 90% ostial stenosis in small AV LCx (most of lateral wall territory was provided by the ramus).  Given small territory and difficult position for PCI, I chose medical management.  He has been unable to tolerate Imdur, ranolazine, or higher dose metoprolol, so I added amlodipine 2.5 daily to his regimen to try to help with angina.   He has rare chest tightness, "every now and then."  No particular triggers.  No exertional dyspnea, but he feels like he fatigues more easily than in the past.  Notes fatigue with raking, shoveling, carrying a heavy load.  However, able to run a chainsaw earlier today.  Able to golf and bowl with no problems.  SBP lower in 100s, occasional lightheadedness with standing.  Labs (10/11): LDL 113, HDL 46, K 5.1, creatinine 1.1, LFTs normal Labs (1/12): K 5.3, creatinine 1.1 Labs (3/12): LDL 51, HDL 37 Labs (12/12): K 4.1, creatinine 0.98 Labs (4/13): LDL 53, HDL 45 Labs (9/13): LDL 54, HDL 46, LFTs normal, TSH normal, K 4.7, creatinine 0.9 Labs (3/14): LDL 36, HDL 38 Labs (10/14): K 4.5, creatinine 1.1, AST 40, ALT 42, LDL 27, HDL 44 Labs (6/15): K 4, creatinine 1.1, HCT 55 Labs (8/15): K 4.9, creatinine 1.2, LDL 32 Labs (10/15): K 4, creatinine 1.2 Labs (2/17): LDL 22 Labs (3/17): K 5, creatinine 1.25 Labs (8/17): AST 53, ALT 41  Allergies (verified):  1)  !  Pcn  Past Medical History: 1. Actinic Keratosis 2. Muscle strains  3. HTN 4. CAD: Anginal-type chest pain.  ETT-myoview (1/12) showed good exercise tolerance but also inferior and V5/V6 1 mm ST depression and chest pain with exercise.  Perfusion images showed reversible apical perfusion defect.  Findings suggested ischemia.  LHC (1/12): LAD heavily calcified with 90% calcified mid LAD stenosis after D2.  The LAD was small caliber (2-2.5 mm) at this point.  Because of this and his lack of unstable symptoms, I elected to manage him medically initially.  ETT-myoview (11/12): 9'30" exercise with mild chest pain and ischemic ST changes.  There was a small reversible apical perfusion defect, similar to the 1/12 study.  LHC repeated 12/12 with 95% mid LAD stenosis after D2.  Promus DES to mid LAD.  Exertional chest pain recurred with LHC done again in 10/13.  There was 99% proximal LAD stenosis just proximal to the stent.  Patient received a Promus DES.   LHC (4/15) with 80% mLAD in-stent restenosis and small to moderate D2 with 90% proximal stenosis.  Patient had LIMA-LAD along with AVR in 5/15.  ETT-Cardiolite (2/17) with EF 59%, 8'23" exercise to 10.1 METS with no chest pain, small reversible basal to mid inferolateral perfusion defect.  LHC (3/17) with patent ramus, RCA, and LIMA-LAD; 90% ostial small AV LCx (most of lateral wall covered by ramus), jailed small D2 (90% ostial stenosis), distal LAD disease => medical management.  5. Bicuspid aortic valve with mild-moderate AS: Echo (1/12) showed EF 60-65%, bicuspid aortic valve with minimal stenosis (mean gradient 9 mmHg), grade II diastolic dysfunction, mild MR, mild LAE.  MR angiogram chest (12/12): No thoracic aortic aneurysm.  Echo (1/14) showed EF 65-70% with mild to moderate AS, mean gradient 22 mmHg.  Echo (1/15) with moderate to severe AS, mean gradient 36, peak 55, AVA 1.0 cm^2.  TEE (1/15) with EF 60-65%, bicuspid aortic valve, AVA 1.0 cm^2 with mean  gradient 26 mmHg, peak 48 mmHg, moderate MR, RV normal.  Echo (4/15) with EF 60-65%, moderate LVH, moderate to severe AS with mean gradient 35 mmHg and AVA 0.7 cm^2.  Patient had bioprosthetic AVR in 5/15.  Echo post-op in 6/15 with EF 55-60%, normal bioprosthetic aortic valve. Echo (1/17) with EF 65%, normal RV size and systolic function, normal bioprosthetic aortic valve.  6. Carotid dopplers (1/12): No significant stenosis.  Carotid dopplers (10/14) with mild bilateral ICA stenosis. Carotid dopplers (10/16) showed no significant stenosis.  7.  Hyperlipidemia: Myalgias with Lipitor.  8.  Mild bradycardia  Family History: 2 brothers with CAD diagnosed in their 60s.  Sister with diabetes, died of complications from this disease.   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    Current Outpatient Prescriptions  Medication Sig Dispense Refill  . amLODipine (NORVASC) 2.5 MG tablet Take 1 tablet (2.5 mg total) by mouth daily. 30 tablet 11  . aspirin 81 MG tablet Take 81 mg by mouth daily.     Marland Kitchen atorvastatin (LIPITOR) 80 MG tablet Take 40 mg by mouth daily.    . calcium carbonate (OS-CAL) 600 MG TABS tablet Take 600 mg by mouth daily with breakfast.    . furosemide (LASIX) 20 MG tablet Take 0.5 tablets (10 mg total) by mouth every other day. 45 tablet 1  . gabapentin (NEURONTIN) 300 MG capsule Take 300 mg by mouth 3 (three) times daily.    Marland Kitchen ipratropium (ATROVENT) 0.03 % nasal spray Place 1 spray into both nostrils 2 (two) times daily.    Marland Kitchen lisinopril (PRINIVIL,ZESTRIL) 5 MG tablet Take 1 tablet (5 mg total) by mouth daily. 30 tablet 11  . loratadine (CLARITIN) 10 MG tablet Take 10 mg by mouth daily.     . magnesium gluconate (MAGONATE) 500 MG tablet Take 500 mg by mouth daily.     . metoprolol tartrate  (LOPRESSOR) 25 MG tablet Take 12.5 mg by mouth 2 (two) times daily.     . Multiple Vitamins-Minerals (PRESERVISION AREDS 2) CAPS Take 2 capsules by mouth daily.    . nitroGLYCERIN (NITROSTAT) 0.4 MG SL tablet Place 1 tablet (0.4 mg total) under the tongue every 5 (five) minutes as needed for chest pain. 90 tablet 3  . Omega-3 Fatty Acids (FISH OIL PO) Take 1 capsule by mouth daily.     . potassium chloride (K-DUR) 10 MEQ tablet Take 1 tablet (10 mEq total) by mouth every other day. 45 tablet 1   No current facility-administered medications for this visit.     BP (!) 118/56   Pulse (!) 56   Ht 5\' 11"  (1.803 m)   Wt 210 lb (95.3 kg)   BMI 29.29 kg/m  General:  Well developed, well nourished, in no acute distress. Neck:  Neck supple, JVP 7 cm. No masses, thyromegaly or abnormal cervical nodes. Lungs:  Slight decreased breath sounds right base.  Heart:  Non-displaced PMI, chest non-tender; regular rate and rhythm, S1, S2.  1/6 early SEM. +S4. Carotid upstroke normal.  Pedals normal pulses. 1+ ankle edema.   Abdomen:  Bowel sounds positive; abdomen soft and non-tender without masses, organomegaly, or hernias noted. No hepatosplenomegaly. Extremities:  No clubbing or cyanosis. Neurologic:  Alert and oriented x 3. Psych:  Normal affect.  Assessment/Plan:  AORTIC VALVE DISORDERS Bicuspid aortic valve with moderate-severe AS and no associated thoracic aortic aneurysm now s/p bioprosthetic AVR.  The valve was well-seated on 1/17 echo.  Coronary artery disease  Status post PCI with DES to proximal LAD in 12/12 and again in 10/13 due to in-stent restenosis.  He had recurrent in-stent restenosis of LAD stent in 4/15, now s/p LIMA-LAD.  ETT-Cardiolite in 2/17 showed good exercise tolerance (10.1 METS) and no CP on the treadmill, but there was a small area of basal to mid inferolateral ischemia. Given ongoing chest pain and anxiety, I did a left heart cath in 3/17. This showed patent LIMA-LAD but 90%  ostial AV LCx.  The ramus had minimal disease and provided flow to most of the lateral wall.  The AV LCx served only a small territory and would be a very difficult vessel to intervene  upon.  Therefore, we opted to medically manage him.  He is now having only atypical chest pain but is easily fatigued compared to the past.  - Continue ASA 81, ACEI, metoprolol, and statin.  - He cannot take Imdur due to headaches and also had side effects from ranolazine.  Unable to increase metoprolol further due to bradycardia and lightheadedness. - Continue amlodipine 2.5 daily.  HYPERLIPIDEMIA Check lipids/LFTs today.   Chronic diastolic CHF I do not think that he is volume overloaded.  He can continue Lasix 10 mg every other day, check BMET.  I am going to get an echo given increased fatigue.  HTN BP actually running somewhat low at home with occasional orthostatic symptoms.  Decrease lisinopril to 5 mg daily.   Followup in 2 months.   Loralie Champagne 08/29/2016

## 2016-08-30 ENCOUNTER — Telehealth: Payer: Self-pay | Admitting: Cardiology

## 2016-08-30 NOTE — Telephone Encounter (Signed)
Follow Up: ° ° ° ° °Returning call,concerning his lab results. °

## 2016-08-30 NOTE — Telephone Encounter (Signed)
Spoke with pt about recent lab results 

## 2016-09-08 ENCOUNTER — Other Ambulatory Visit: Payer: Self-pay

## 2016-09-08 ENCOUNTER — Ambulatory Visit (HOSPITAL_COMMUNITY): Payer: PPO | Attending: Cardiology

## 2016-09-08 DIAGNOSIS — I359 Nonrheumatic aortic valve disorder, unspecified: Secondary | ICD-10-CM | POA: Insufficient documentation

## 2016-10-19 ENCOUNTER — Encounter: Payer: Self-pay | Admitting: Cardiology

## 2016-11-02 ENCOUNTER — Ambulatory Visit (INDEPENDENT_AMBULATORY_CARE_PROVIDER_SITE_OTHER): Payer: PPO | Admitting: Cardiology

## 2016-11-02 ENCOUNTER — Encounter: Payer: Self-pay | Admitting: Cardiology

## 2016-11-02 ENCOUNTER — Encounter (INDEPENDENT_AMBULATORY_CARE_PROVIDER_SITE_OTHER): Payer: Self-pay

## 2016-11-02 VITALS — BP 106/60 | HR 60 | Ht 71.0 in | Wt 209.8 lb

## 2016-11-02 DIAGNOSIS — I25110 Atherosclerotic heart disease of native coronary artery with unstable angina pectoris: Secondary | ICD-10-CM

## 2016-11-02 DIAGNOSIS — I359 Nonrheumatic aortic valve disorder, unspecified: Secondary | ICD-10-CM

## 2016-11-02 MED ORDER — LISINOPRIL 2.5 MG PO TABS: 2.5000 mg | ORAL_TABLET | Freq: Every day | ORAL | 3 refills | Status: DC

## 2016-11-02 NOTE — Patient Instructions (Addendum)
Medication Instructions:  Your physician recommends that you continue on your current medications as directed. Please refer to the Current Medication list given to you today.   Labwork: none  Testing/Procedures: None   Follow-Up:  Your physician wants you to follow-up in: 6 months with Dr Burt Knack. (May 2018).  You will receive a reminder letter in the mail two months in advance. If you don't receive a letter, please call our office to schedule the follow-up appointment.    Any Other Special Instructions Will Be Listed Below (If Applicable).     If you need a refill on your cardiac medications before your next appointment, please call your pharmacy.

## 2016-11-03 NOTE — Progress Notes (Signed)
Patient ID: Travis Armstrong, male   DOB: 21-Dec-1940, 75 y.o.   MRN: [id number] PCP: Dr. Redmond Armstrong  75 yo with history of HTN presented initially for evaluation of chest pain in 2011.  It occurred with splitting wood and after walking about 1/4 mile on flat ground.  Given these exertional symptoms, I set him up for an ETT-myoview. This showed evidence for ischemia by exercise ECG, and there was a small reversible apical perfusion defect possibly suggestive of ischemia as well.  Echo to assess for cause of systolic murmur showed a bicuspid aortic valve with preserved LV systolic function and no significant aortic stenosis.  I set him up for a left heart cath in 1/12, which showed heavy calcification in the LAD.  There was a long 90% mid LAD stenosis.  The LAD was small at this point, a 2-2.5 mm vessel.  I decided to proceed with medical treatment as his symptoms were only with moderate exertion and as the LAD was a small caliber vessel at the site of the stenosis.  Given the bicuspid aortic valve, I did an MR angiogram of the thoracic aorta, which ruled out aortic aneurysm.  In the fall of 2012, he had recurrent chest pain and I repeated a cath, this showed worsening of the mid LAD stenosis up to 95%.  He underwent PCI with Promus DES to the mid LAD in 12/12.  In the fall of 2013, he again developed exertional chest pain.  I did a left heart cath in 10/13 showing 99% stenosis in the proximal LAD just proximal to the stent.  He had a Promus DES placed to the proximal LAD.  In 1/15, patient had echo concerning for moderate-severe AS (progression).  TEE was done to assess more closely, this was more consistent with moderate AS. Patient again developed exertional chest pain in 4/15 and had LHC showing 80% mLAD in-stent restenosis.  Echo was done again in 4/15, suggestive of moderate to severe AS.  Patient was then taken for LIMA-LAD with bioprosthetic AVR by Dr. Roxy Armstrong in 5/15.  Post-op echo in 6/15 showed normal EF and  well-seated bioprosthetic aortic valve.   In 2/17, he reported mild exertional chest tightness.  He saw Travis Armstrong and was given a prescription for ranolazine and ETT-Cardiolite was done in 2/17. He had no chest pain while walking on the treadmill.  Cardiolite showed small reversible basal to mid inferolateral perfusion defect concerning for ischemia.  Echo (1/17) from the [place] showed EF 65% and normal bioprosthetic aortic valve. He tried ranolazine but had side effects so stopped it.  He continued to have chest pain, so I did a cardiac cath in 3/17.  This showed patent ramus, RCA, and LIMA-LAD.  There was 90% ostial stenosis in small AV LCx (most of lateral wall territory was provided by the ramus).  Given small territory and difficult position for PCI, I chose medical management.  He has been unable to tolerate Imdur, ranolazine, or higher dose metoprolol, so I added amlodipine 2.5 daily to his regimen to try to help with angina.  Echo (9/17) with EF 55%, normal bioprosthetic aortic valve.   He is no longer taking amlodipine, not sure why.  He has rare chest tightness, "every now and then."  No particular triggers. No change to the pattern. No exertional dyspnea, but he feels like he fatigues more easily than in the past.  Notes fatigue with raking, shoveling, carrying a heavy load.  Able to golf and bowl with  no problems.  Main complaint currently is low back pain with sciatica.     Labs (10/11): LDL 113, HDL 46, K 5.1, creatinine 1.1, LFTs normal Labs (1/12): K 5.3, creatinine 1.1 Labs (3/12): LDL 51, HDL 37 Labs (12/12): K 4.1, creatinine 0.98 Labs (4/13): LDL 53, HDL 45 Labs (9/13): LDL 54, HDL 46, LFTs normal, TSH normal, K 4.7, creatinine 0.9 Labs (3/14): LDL 36, HDL 38 Labs (10/14): K 4.5, creatinine 1.1, AST 40, ALT 42, LDL 27, HDL 44 Labs (6/15): K 4, creatinine 1.1, HCT 55 Labs (8/15): K 4.9, creatinine 1.2, LDL 32 Labs (10/15): K 4, creatinine 1.2 Labs (2/17): LDL 22 Labs (3/17): K  5, creatinine 1.25 Labs (8/17): AST 53, ALT 41 Labs (9/17): K 3.9, creatinine 1.26, LDL 25, HDL 43, TSH normal, hgb 12.6  ECG: NSR, RBBB  Allergies (verified):  1)  ! Pcn  Past Medical History: 1. Actinic Keratosis 2. Muscle strains  3. HTN 4. CAD: Anginal-type chest pain.  ETT-myoview (1/12) showed good exercise tolerance but also inferior and V5/V6 1 mm ST depression and chest pain with exercise.  Perfusion images showed reversible apical perfusion defect.  Findings suggested ischemia.  LHC (1/12): LAD heavily calcified with 90% calcified mid LAD stenosis after D2.  The LAD was small caliber (2-2.5 mm) at this point.  Because of this and his lack of unstable symptoms, I elected to manage him medically initially.  ETT-myoview (11/12): 9'30" exercise with mild chest pain and ischemic ST changes.  There was a small reversible apical perfusion defect, similar to the 1/12 study.  LHC repeated 12/12 with 95% mid LAD stenosis after D2.  Promus DES to mid LAD.  Exertional chest pain recurred with LHC done again in 10/13.  There was 99% proximal LAD stenosis just proximal to the stent.  Patient received a Promus DES.   LHC (4/15) with 80% mLAD in-stent restenosis and small to moderate D2 with 90% proximal stenosis.  Patient had LIMA-LAD along with AVR in 5/15.  ETT-Cardiolite (2/17) with EF 59%, 8'23" exercise to 10.1 METS with no chest pain, small reversible basal to mid inferolateral perfusion defect.  LHC (3/17) with patent ramus, RCA, and LIMA-LAD; 90% ostial small AV LCx (most of lateral wall covered by ramus), jailed small D2 (90% ostial stenosis), distal LAD disease => medical management.  5. Bicuspid aortic valve with mild-moderate AS: Echo (1/12) showed EF 60-65%, bicuspid aortic valve with minimal stenosis (mean gradient 9 mmHg), grade II diastolic dysfunction, mild MR, mild LAE.  MR angiogram chest (12/12): No thoracic aortic aneurysm.  Echo (1/14) showed EF 65-70% with mild to moderate AS, mean  gradient 22 mmHg.  Echo (1/15) with moderate to severe AS, mean gradient 36, peak 55, AVA 1.0 cm^2.  TEE (1/15) with EF 60-65%, bicuspid aortic valve, AVA 1.0 cm^2 with mean gradient 26 mmHg, peak 48 mmHg, moderate MR, RV normal.  Echo (4/15) with EF 60-65%, moderate LVH, moderate to severe AS with mean gradient 35 mmHg and AVA 0.7 cm^2.  Patient had bioprosthetic AVR in 5/15.  Echo post-op in 6/15 with EF 55-60%, normal bioprosthetic aortic valve. Echo (1/17) with EF 65%, normal RV size and systolic function, normal bioprosthetic aortic valve.  - Echo (9/17): EF 55%, mild LVH, moderate diastolic dysfunction, mildly dilated RV with normal systolic function, normal bioprosthetic aortic valve.  6. Carotid dopplers (1/12): No significant stenosis.  Carotid dopplers (10/14) with mild bilateral ICA stenosis. Carotid dopplers (10/16) showed no significant stenosis.  7.  Hyperlipidemia: Myalgias with Lipitor.  8.  Mild bradycardia  Family History: 2 brothers with CAD diagnosed in their 50s.  Sister with diabetes, died of complications from this disease.   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    Current Outpatient Prescriptions  Medication Sig Dispense Refill  . aspirin 81 MG tablet Take 81 mg by mouth daily.     Marland Kitchen atorvastatin (LIPITOR) 80 MG tablet Take 40 mg by mouth daily.    . calcium carbonate (OS-CAL) 600 MG TABS tablet Take 600 mg by mouth daily with breakfast.    . furosemide (LASIX) 20 MG tablet Take 10 mg by mouth daily as needed (swelling).    . gabapentin (NEURONTIN) 300 MG capsule Take 300 mg by mouth 3 (three) times daily.    Marland Kitchen ipratropium (ATROVENT) 0.03 % nasal spray Place 1 spray into both nostrils 2 (two) times daily.    Marland Kitchen loratadine (CLARITIN) 10 MG tablet Take 10 mg by mouth daily.      . magnesium gluconate (MAGONATE) 500 MG tablet Take 500 mg by mouth daily.     . metoprolol tartrate (LOPRESSOR) 25 MG tablet Take 12.5 mg by mouth 2 (two) times daily.     . Multiple Vitamins-Minerals (PRESERVISION AREDS 2) CAPS Take 2 capsules by mouth daily.    . nitroGLYCERIN (NITROSTAT) 0.4 MG SL tablet Place 1 tablet (0.4 mg total) under the tongue every 5 (five) minutes as needed for chest pain. 90 tablet 3  . Omega-3 Fatty Acids (FISH OIL PO) Take 1 capsule by mouth daily.     . potassium chloride (K-DUR) 10 MEQ tablet Daily on the days that you take furosemide(lasix    . lisinopril (PRINIVIL,ZESTRIL) 2.5 MG tablet Take 1 tablet (2.5 mg total) by mouth daily. 90 tablet 3   No current facility-administered medications for this visit.     BP 106/60   Pulse 60   Ht 5\' 11"  (1.803 m)   Wt 209 lb 12.8 oz (95.2 kg)   BMI 29.26 kg/m  General:  Well developed, well nourished, in no acute distress. Neck:  Neck supple, JVP 7 cm. No masses, thyromegaly or abnormal cervical nodes. Lungs:  Slight decreased breath sounds right base.  Heart:  Non-displaced PMI, chest non-tender; regular rate and rhythm, S1, S2.  1/6 early SEM. +S4. Carotid upstroke normal.  Pedals normal pulses. 1+ ankle edema.   Abdomen:  Bowel sounds positive; abdomen soft and non-tender without masses, organomegaly, or hernias noted. No hepatosplenomegaly. Extremities:  No clubbing or cyanosis. Neurologic:  Alert and oriented x 3. Psych:  Normal affect.  Assessment/Plan:  AORTIC VALVE DISORDERS Bicuspid aortic valve with moderate-severe AS and no associated thoracic aortic aneurysm now s/p bioprosthetic AVR.  The valve was well-seated on 9/17 echo.  Coronary artery disease  Status post PCI with DES to proximal LAD in 12/12 and again in 10/13 due to in-stent restenosis.  He had recurrent in-stent restenosis of LAD stent in 4/15, now s/p LIMA-LAD.  ETT-Cardiolite in 2/17 showed good exercise tolerance (10.1 METS) and no CP  on the treadmill, but there was a small area of basal to mid inferolateral ischemia. Given ongoing chest pain and anxiety, I did a left heart cath in 3/17. This showed patent LIMA-LAD but 90% ostial AV LCx.  The ramus  had minimal disease and provided flow to most of the lateral wall. The AV LCx served only a small territory and would be a very difficult vessel to intervene upon.  Therefore, we opted to medically manage him.  He is now having only atypical chest pain but is easily fatigued compared to the past.  - Continue ASA 81, ACEI, metoprolol, and statin.  - He cannot take Imdur due to headaches and also had side effects from ranolazine.  Unable to increase metoprolol further due to bradycardia and lightheadedness.  He is no longer taking amlodipine but think he can stay off given minimal chest pain and soft BP.  HYPERLIPIDEMIA Check lipids/LFTs today.   Chronic diastolic CHF I do not think that he is volume overloaded.  He uses Lasix only prn.   HTN BP actually running somewhat low at home with occasional orthostatic symptoms.  Lisinopril has been decreased to 2.5 mg daily.   Followup in 6 months.  Given my transition to CHF clinic, he will see Dr. Burt Knack.   Loralie Champagne 11/03/2016

## 2016-11-13 IMAGING — NM NM MISC PROCEDURE
6 series · 36 of 36 positions shown · non-contrast
Comparison: none

[Series 1: wbr_r-proj_st rest · 6.51mm/px · 6 of 64 frames shown]
[frame 6/64]
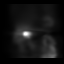
[frame 16/64]
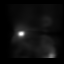
[frame 27/64]
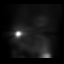
[frame 38/64]
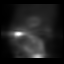
[frame 48/64]
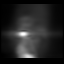
[frame 59/64]
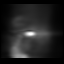

[Series 1: rest · 6.51mm/px · 6 of 64 frames shown]
[frame 6/64]
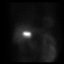
[frame 16/64]
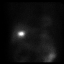
[frame 27/64]
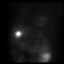
[frame 38/64]
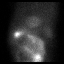
[frame 48/64]
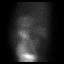
[frame 59/64]
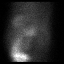

[Series 2: stress · 6.51mm/px · 6 of 64 frames shown (1 of 2)]
[frame 6/64]
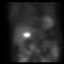
[frame 16/64]
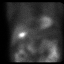
[frame 27/64]
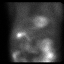
[frame 38/64]
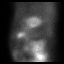
[frame 48/64]
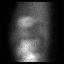
[frame 59/64]
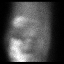

[Series 2: wbr_s-proj_st stress · 6.51mm/px · 6 of 512 frames shown (1 of 2)]
[frame 43/512]
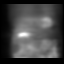
[frame 128/512]
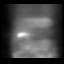
[frame 214/512]
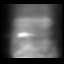
[frame 299/512]
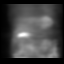
[frame 384/512]
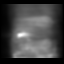
[frame 470/512]
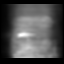

[Series 2: wbr_s-proj_st stress · 6.51mm/px · 6 of 64 frames shown (2 of 2)]
[frame 6/64]
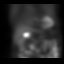
[frame 16/64]
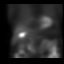
[frame 27/64]
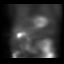
[frame 38/64]
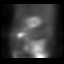
[frame 48/64]
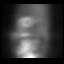
[frame 59/64]
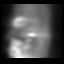

[Series 2: stress · 6.51mm/px · 6 of 508 frames shown (2 of 2)]
[frame 43/508  full-range]
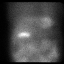
[frame 127/508  full-range]
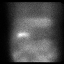
[frame 212/508  full-range]
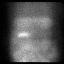
[frame 297/508  full-range]
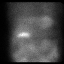
[frame 381/508  full-range]
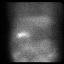
[frame 466/508  full-range]
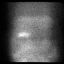

[36 of 36 positions shown; findings below may reference images not displayed]

Canned report from images found in remote index.

Refer to host system for actual result text.

## 2017-01-17 ENCOUNTER — Telehealth: Payer: Self-pay | Admitting: Cardiology

## 2017-01-17 NOTE — Telephone Encounter (Signed)
New message   Pt verbalized that he is calling to speak to rn about what pt can and cant take over   the counter and he also wants to confirm that he is suppose to follow up with Dr.Cooper

## 2017-01-17 NOTE — Telephone Encounter (Signed)
Pt advised he can use Robitussin without decongestant, coricidin, and tylenol prn for cough and cold symptoms, pt denies fever. Pt advised to contact PCP if develops fever or symptoms get worse

## 2017-01-17 NOTE — Telephone Encounter (Signed)
Pt asking about taking OTC medication for non productive cough and cold symptoms.

## 2017-05-02 ENCOUNTER — Encounter (INDEPENDENT_AMBULATORY_CARE_PROVIDER_SITE_OTHER): Payer: Self-pay

## 2017-05-02 ENCOUNTER — Ambulatory Visit (INDEPENDENT_AMBULATORY_CARE_PROVIDER_SITE_OTHER): Payer: PPO | Admitting: Cardiovascular Disease

## 2017-05-02 ENCOUNTER — Encounter: Payer: Self-pay | Admitting: Cardiovascular Disease

## 2017-05-02 VITALS — BP 132/68 | HR 52 | Ht 71.0 in | Wt 213.8 lb

## 2017-05-02 DIAGNOSIS — I359 Nonrheumatic aortic valve disorder, unspecified: Secondary | ICD-10-CM

## 2017-05-02 DIAGNOSIS — I25118 Atherosclerotic heart disease of native coronary artery with other forms of angina pectoris: Secondary | ICD-10-CM | POA: Diagnosis not present

## 2017-05-02 DIAGNOSIS — E782 Mixed hyperlipidemia: Secondary | ICD-10-CM | POA: Diagnosis not present

## 2017-05-02 NOTE — Progress Notes (Signed)
Cardiology Office Note Date:  05/02/2017   ID:  Travis Armstrong, DOB January 09, 1941, MRN [id number]  PCP:  Christain Sacramento, MD  Cardiologist:  Sherren Mocha, MD    Chief Complaint  Patient presents with  . Follow-up    History of Present Illness: Travis Armstrong is a 76 y.o. male who presents for Follow-up evaluation. The patient has been followed by Dr. Benjamine Mola in the past. This is his first encounter with me. The patient has a history of bicuspid aortic valve stenosis and coronary artery disease involving the LAD. He initially presented with exertional angina and was found to have severe mid LAD stenosis which was managed medically because of the small caliber vessel. He returned in 2012 with recurrent chest pain and was found to have progressive disease treated with PCI. Chest pain recurred the following year demonstrating critical stenosis of the proximal LAD at the stent margin. PCI was again performed using a drug-eluting stent in overlapping fashion over the proximal stent edge. The patient then went on to develop progressive symptoms of aortic stenosis related to his bicuspid aortic valve as well as recurrent ischemic symptoms again demonstrating in-stent restenosis in 2015. The patient ultimately was treated with bioprosthetic aortic valve replacement and LIMA to LAD bypass in May 2015. He's had episodes of recurrent angina since undergoing surgery and repeat cardiac catheterization was performed in 2017 demonstrating patency of the ramus intermedius, RCA, and LIMA to LAD graft. There was tight ostial stenosis of the small AV circumflex with medical therapy recommended. Patient has had difficulty tolerating antianginal therapy because of side effects.  The patient is here alone today. He has been getting along well. Complains of generalized fatigue and having to rest more often and he used to when he does work. He denies exertional angina of late. No nitroglycerin used in the last 6 months. He has  some limitation from back problems. He is able to walk over 1 mile daily without symptoms. He denies orthopnea, PND, or heart palpitations. He has mild shortness of breath with activity which is unchanged over time.  Past Medical History:  Diagnosis Date  . Anginal pain (HCC)    has not taken nitro  . Aortic stenosis    a. bicuspid aortic valve;  b.  s/p AVR 04/2014 - 25 mm [facility] Ease bovine pericardial tissue valve  . Arthritis    back  . Bradycardia, sinus   . CAD (coronary artery disease)    s/p CABG 04/2014 - LIMA to LAD  . History of pneumonia 1958  . History of skin cancer   . HTN (hypertension)   . Hx of echocardiogram    a. Echo (05/2014):  Mild focal basal septal hypertrophy, EF 55-60%, no RWMA, Gr 1 DD, AVR ok, MAC, mild LAE, mild RVE, mild RAE.  Marland Kitchen Hyperlipidemia   . Keratosis, actinic   . Muscle strain     Past Surgical History:  Procedure Laterality Date  . AORTIC VALVE REPLACEMENT N/A 05/03/2014   Procedure: AORTIC VALVE REPLACEMENT (AVR);  Surgeon: Rexene Alberts, MD;  Location: [place];  Service: Open Heart Surgery;  Laterality: N/A;  . CARDIAC CATHETERIZATION N/A 02/25/2016   Procedure: Left Heart Cath and Coronary Angiography;  Surgeon: Larey Dresser, MD;  Location: [place] CV LAB;  Service: Cardiovascular;  Laterality: N/A;  . CORONARY ANGIOPLASTY WITH STENT PLACEMENT  11/23/2011   DES mid LAD  . CORONARY ANGIOPLASTY WITH STENT PLACEMENT  10/02/2012   DES  prox LAD  . CORONARY ARTERY BYPASS GRAFT N/A 05/03/2014   Procedure: CORONARY ARTERY BYPASS GRAFTING (CABG);  Surgeon: Rexene Alberts, MD;  Location: [place];  Service: Open Heart Surgery;  Laterality: N/A;  Times 1 using left internal mammary artery.  . INGUINAL HERNIA REPAIR  ~ 2007   left  . INTRAOPERATIVE TRANSESOPHAGEAL ECHOCARDIOGRAM N/A 05/03/2014   Procedure: INTRAOPERATIVE TRANSESOPHAGEAL ECHOCARDIOGRAM;  Surgeon: Rexene Alberts, MD;  Location: [place];  Service: Open Heart Surgery;  Laterality:  N/A;  . LEFT HEART CATHETERIZATION WITH CORONARY ANGIOGRAM N/A 04/12/2014   Procedure: LEFT HEART CATHETERIZATION WITH CORONARY ANGIOGRAM;  Surgeon: Larey Dresser, MD;  Location: [facility] CATH LAB;  Service: Cardiovascular;  Laterality: N/A;  . PERCUTANEOUS CORONARY STENT INTERVENTION (PCI-S) N/A 11/23/2011   Procedure: PERCUTANEOUS CORONARY STENT INTERVENTION (PCI-S);  Surgeon: Peter M Martinique, MD;  Location: [facility] CATH LAB;  Service: Cardiovascular;  Laterality: N/A;  . PERCUTANEOUS CORONARY STENT INTERVENTION (PCI-S) N/A 10/02/2012   Procedure: PERCUTANEOUS CORONARY STENT INTERVENTION (PCI-S);  Surgeon: Hillary Bow, MD;  Location: [facility] CATH LAB;  Service: Cardiovascular;  Laterality: N/A;  . TEE WITHOUT CARDIOVERSION N/A 01/15/2014   Procedure: TRANSESOPHAGEAL ECHOCARDIOGRAM (TEE);  Surgeon: Larey Dresser, MD;  Location: [facility] ENDOSCOPY;  Service: Cardiovascular;  Laterality: N/A;    Current Outpatient Prescriptions  Medication Sig Dispense Refill  . aspirin 81 MG tablet Take 81 mg by mouth daily.     Marland Kitchen atorvastatin (LIPITOR) 80 MG tablet Take 40 mg by mouth daily.    . calcium carbonate (OS-CAL) 600 MG TABS tablet Take 600 mg by mouth daily with breakfast.    . furosemide (LASIX) 20 MG tablet Take 10 mg by mouth as directed. Every other day as directed for swelling    . gabapentin (NEURONTIN) 300 MG capsule Take 300 mg by mouth 3 (three) times daily.    Marland Kitchen ipratropium (ATROVENT) 0.03 % nasal spray Place 1 spray into both nostrils 2 (two) times daily.    Marland Kitchen loratadine (CLARITIN) 10 MG tablet Take 10 mg by mouth daily.     . magnesium gluconate (MAGONATE) 500 MG tablet Take 500 mg by mouth daily.     . metoprolol tartrate (LOPRESSOR) 25 MG tablet Take 12.5 mg by mouth 2 (two) times daily.     . Multiple Vitamins-Minerals (PRESERVISION AREDS 2) CAPS Take 2 capsules by mouth daily.    . nitroGLYCERIN (NITROSTAT) 0.4 MG SL tablet Place 1 tablet (0.4 mg total) under the tongue every 5 (five) minutes as  needed for chest pain. 90 tablet 3  . Omega-3 Fatty Acids (FISH OIL PO) Take 1 capsule by mouth daily.     . potassium chloride (K-DUR) 10 MEQ tablet Daily on the days that you take furosemide(lasix    . lisinopril (PRINIVIL,ZESTRIL) 2.5 MG tablet Take 1 tablet (2.5 mg total) by mouth daily. 90 tablet 3   No current facility-administered medications for this visit.     Allergies:   Ranexa [ranolazine]; Penicillins; and Other   Social History:  The patient  reports that he quit smoking about 45 years ago. His smoking use included Cigarettes. He has a 8.00 pack-year smoking history. He has never used smokeless tobacco. He reports that he does not drink alcohol or use drugs.   Family History:  The patient's  family history includes COPD in his sister; Congestive Heart Failure in his mother; Coronary artery disease in his brother; Diabetes in his sister; Heart disease in his father; Lung cancer in  his sister; Stroke in his brother.    ROS:  Please see the history of present illness.  Otherwise, review of systems is positive for weight gain, back pain, dizziness, easy bruising, excessive fatigue, snoring.  All other systems are reviewed and negative.   PHYSICAL EXAM: VS:  BP 132/68   Pulse (!) 52   Ht 5' 11"  (1.803 m)   Wt 213 lb 12.8 oz (97 kg)   BMI 29.82 kg/m  , BMI Body mass index is 29.82 kg/m. GEN: Well nourished, well developed, in no acute distress  HEENT: normal  Neck: no JVD, no masses. No carotid bruits Cardiac: RRR with 2/6 SEM at the RUSB, no diastolic murmur, widely split physiologic S2              Respiratory:  clear to auscultation bilaterally, normal work of breathing GI: soft, nontender, nondistended, + BS MS: no deformity or atrophy  Ext: no pretibial edema, pedal pulses 2+= bilaterally Skin: warm and dry, no rash Neuro:  Strength and sensation are intact Psych: euthymic mood, full affect  EKG:  EKG is ordered today. The ekg ordered today shows sinus bradycardia,  RBBB, possible age-indeterminate inferior infarct  Recent Labs: 08/27/2016: ALT 30; BUN 18; Creat 1.26; Hemoglobin 12.6; Platelets 136; Potassium 3.9; Sodium 133; TSH 2.31   Lipid Panel     Component Value Date/Time   CHOL 89 (L) 08/27/2016 1529   TRIG 106 08/27/2016 1529   HDL 43 08/27/2016 1529   CHOLHDL 2.1 08/27/2016 1529   VLDL 21 08/27/2016 1529   LDLCALC 25 08/27/2016 1529      Wt Readings from Last 3 Encounters:  05/02/17 213 lb 12.8 oz (97 kg)  11/02/16 209 lb 12.8 oz (95.2 kg)  08/27/16 210 lb (95.3 kg)     Cardiac Studies Reviewed: 2D Echo 09-08-2016: Study Conclusions  - Left ventricle: The cavity size was normal. Wall thickness was   increased in a pattern of mild LVH. The estimated ejection   fraction was 55%. Wall motion was normal; there were no regional   wall motion abnormalities. Features are consistent with a   pseudonormal left ventricular filling pattern, with concomitant   abnormal relaxation and increased filling pressure (grade 2   diastolic dysfunction). - Aortic valve: There is a bioprosthetic aortic valve. The valve   functions normally with no significant regurgitation or stenosis.   Mean gradient (S): 8 mm Hg. - Mitral valve: There was trivial regurgitation. - Left atrium: The atrium was mildly dilated. - Right ventricle: The cavity size was mildly dilated. Systolic   function was normal. - Right atrium: The atrium was mildly dilated. - Tricuspid valve: Peak RV-RA gradient (S): 21 mm Hg. - Pulmonary arteries: PA peak pressure: 24 mm Hg (S). - Inferior vena cava: The vessel was normal in size. The   respirophasic diameter changes were in the normal range (>= 50%),   consistent with normal central venous pressure.  Impressions:  - Normal size with mild LV hypertrophy. EF 55%. Moderate diastolic   dysfunction. Mildly dilated RV with normal systolic function.   Normal bioprosthetic aortic valve.  Cardiac Catheterization  02-25-2016: Conclusion   1. Patent RCA, ramus, and LIMA-LAD.   2. 90% ostial stenosis of relatively small AV LCx.  This vessel does not cover much territory (ramus covers most of lateral wall).  This would be difficult to intervene upon and covers small territory, so decided on ongoing medical management (reviewed with Dr Tamala Julian). 3. Jailed small D2  with 90% ostial stenosis.  4. Distal LAD disease.   No good interventional option.  He does have possible sources of angina though these vessels only cover small territories.  Would continue medical management.  HR in 50s so will not increase metoprolol.  He has not tolerated Imdur or ranolazine. I will have him start amlodipine 2.5 mg daily.       ASSESSMENT AND PLAN: 1.  CAD, native vessel, with angina: The patient appears stable with no recent anginal symptoms. He has not taken nitroglycerin since his last visit. He has been unable to tolerate aggressive medical therapy but he continues to tolerate low-dose metoprolol. He has residual disease at the ostium of the circumflex which supplies a small territory. His mammary graft is patent and other vessels have no high-grade obstruction. Will continue with current management.  2. Aortic valve disease status post aortic valve replacement: Most recent echocardiogram is reviewed. Patient with normal function of his aortic bioprosthesis. Exam appears benign. Will follow-up in 6 months.  3. Hyperlipidemia: Lipids at goal as outlined above. Labs are regularly followed through the [place] system. The patient takes atorvastatin 80 mg.  We discussed the importance of weight loss and exercise. His weight is 214 pounds today which is higher than his baseline which seems to run between 192 and 205 pounds.  Current medicines are reviewed with the patient today.  The patient does not have concerns regarding medicines.  Labs/ tests ordered today include:   Orders Placed This Encounter  Procedures  . EKG 12-Lead    Disposition:   FU 6 months  Signed, Sherren Mocha, MD  05/02/2017 8:51 AM    [facility] [place], [place], [place]  [address] Phone: [phone]; Fax: [phone]

## 2017-05-02 NOTE — Patient Instructions (Signed)
Medication Instructions:  Your physician recommends that you continue on your current medications as directed. Please refer to the Current Medication list given to you today.   Labwork: None ordered  Testing/Procedures: None ordered  Follow-Up: Your physician wants you to follow-up in: 6 MONTHS WITH DR. COOPER   You will receive a reminder letter in the mail two months in advance. If you don't receive a letter, please call our office to schedule the follow-up appointment.   Any Other Special Instructions Will Be Listed Below (If Applicable).     If you need a refill on your cardiac medications before your next appointment, please call your pharmacy.   

## 2017-11-08 ENCOUNTER — Encounter (INDEPENDENT_AMBULATORY_CARE_PROVIDER_SITE_OTHER): Payer: Self-pay

## 2017-11-08 ENCOUNTER — Encounter: Payer: Self-pay | Admitting: Cardiovascular Disease

## 2017-11-08 ENCOUNTER — Ambulatory Visit: Payer: PPO | Admitting: Cardiovascular Disease

## 2017-11-08 VITALS — BP 134/66 | HR 50 | Ht 71.0 in | Wt 216.4 lb

## 2017-11-08 DIAGNOSIS — E785 Hyperlipidemia, unspecified: Secondary | ICD-10-CM | POA: Diagnosis not present

## 2017-11-08 DIAGNOSIS — I359 Nonrheumatic aortic valve disorder, unspecified: Secondary | ICD-10-CM

## 2017-11-08 DIAGNOSIS — I25119 Atherosclerotic heart disease of native coronary artery with unspecified angina pectoris: Secondary | ICD-10-CM

## 2017-11-08 NOTE — Progress Notes (Signed)
Cardiology Office Note Date:  11/08/2017   ID:  Travis Armstrong, DOB Aug 07, 1941, MRN [id number]  PCP:  Christain Sacramento, MD  Cardiologist:  Janne Lab, MD    Chief Complaint  Patient presents with  . Coronary Artery Disease     History of Present Illness: Travis Armstrong is a 76 y.o. male who presents for follow-up evaluation.   The patient has a history of bicuspid aortic valve stenosis and coronary artery disease involving the LAD. He initially presented with exertional angina and was found to have severe mid LAD stenosis which was managed medically because of the small caliber vessel. He returned in 2012 with recurrent chest pain and was found to have progressive disease treated with PCI. Chest pain recurred the following year demonstrating critical stenosis of the proximal LAD at the stent margin. PCI was again performed using a drug-eluting stent in overlapping fashion over the proximal stent edge. The patient then went on to develop progressive symptoms of aortic stenosis related to his bicuspid aortic valve as well as recurrent ischemic symptoms again demonstrating in-stent restenosis in 2015. The patient ultimately was treated with bioprosthetic aortic valve replacement and LIMA to LAD bypass in May 2015. He's had episodes of recurrent angina since undergoing surgery and repeat cardiac catheterization was performed in 2017 demonstrating patency of the ramus intermedius, RCA, and LIMA to LAD graft. There was tight ostial stenosis of the small AV circumflex with medical therapy recommended. Patient has had difficulty tolerating antianginal therapy because of side effects.  The patient is here alone today. He is doing well. Has some limitation from low back pain. Today, he denies symptoms of palpitations, chest pain, shortness of breath, orthopnea, PND, lower extremity edema, dizziness, or syncope.   Past Medical History:  Diagnosis Date  . Anginal pain (HCC)    has not taken nitro  .  Aortic stenosis    a. bicuspid aortic valve;  b.  s/p AVR 04/2014 - 25 mm [facility] Ease bovine pericardial tissue valve  . Arthritis    back  . Bradycardia, sinus   . CAD (coronary artery disease)    s/p CABG 04/2014 - LIMA to LAD  . History of pneumonia 1958  . History of skin cancer   . HTN (hypertension)   . Hx of echocardiogram    a. Echo (05/2014):  Mild focal basal septal hypertrophy, EF 55-60%, no RWMA, Gr 1 DD, AVR ok, MAC, mild LAE, mild RVE, mild RAE.  Marland Kitchen Hyperlipidemia   . Keratosis, actinic   . Muscle strain     Past Surgical History:  Procedure Laterality Date  . AORTIC VALVE REPLACEMENT N/A 05/03/2014   Procedure: AORTIC VALVE REPLACEMENT (AVR);  Surgeon: Rexene Alberts, MD;  Location: [place];  Service: Open Heart Surgery;  Laterality: N/A;  . CARDIAC CATHETERIZATION N/A 02/25/2016   Procedure: Left Heart Cath and Coronary Angiography;  Surgeon: Larey Dresser, MD;  Location: [place] CV LAB;  Service: Cardiovascular;  Laterality: N/A;  . CORONARY ANGIOPLASTY WITH STENT PLACEMENT  11/23/2011   DES mid LAD  . CORONARY ANGIOPLASTY WITH STENT PLACEMENT  10/02/2012   DES prox LAD  . CORONARY ARTERY BYPASS GRAFT N/A 05/03/2014   Procedure: CORONARY ARTERY BYPASS GRAFTING (CABG);  Surgeon: Rexene Alberts, MD;  Location: [place];  Service: Open Heart Surgery;  Laterality: N/A;  Times 1 using left internal mammary artery.  . INGUINAL HERNIA REPAIR  ~ 2007   left  . INTRAOPERATIVE  TRANSESOPHAGEAL ECHOCARDIOGRAM N/A 05/03/2014   Procedure: INTRAOPERATIVE TRANSESOPHAGEAL ECHOCARDIOGRAM;  Surgeon: Rexene Alberts, MD;  Location: [place];  Service: Open Heart Surgery;  Laterality: N/A;  . LEFT HEART CATHETERIZATION WITH CORONARY ANGIOGRAM N/A 04/12/2014   Procedure: LEFT HEART CATHETERIZATION WITH CORONARY ANGIOGRAM;  Surgeon: Larey Dresser, MD;  Location: [facility] CATH LAB;  Service: Cardiovascular;  Laterality: N/A;  . PERCUTANEOUS CORONARY STENT INTERVENTION (PCI-S) N/A 11/23/2011    Procedure: PERCUTANEOUS CORONARY STENT INTERVENTION (PCI-S);  Surgeon: Peter M Martinique, MD;  Location: [facility] CATH LAB;  Service: Cardiovascular;  Laterality: N/A;  . PERCUTANEOUS CORONARY STENT INTERVENTION (PCI-S) N/A 10/02/2012   Procedure: PERCUTANEOUS CORONARY STENT INTERVENTION (PCI-S);  Surgeon: Hillary Bow, MD;  Location: [facility] CATH LAB;  Service: Cardiovascular;  Laterality: N/A;  . TEE WITHOUT CARDIOVERSION N/A 01/15/2014   Procedure: TRANSESOPHAGEAL ECHOCARDIOGRAM (TEE);  Surgeon: Larey Dresser, MD;  Location: [facility] ENDOSCOPY;  Service: Cardiovascular;  Laterality: N/A;    Current Outpatient Medications  Medication Sig Dispense Refill  . aspirin 81 MG tablet Take 81 mg by mouth daily.     Marland Kitchen atorvastatin (LIPITOR) 80 MG tablet Take 40 mg by mouth daily.    . calcium carbonate (OS-CAL) 600 MG TABS tablet Take 600 mg by mouth daily with breakfast.    . gabapentin (NEURONTIN) 300 MG capsule Take 300 mg by mouth 3 (three) times daily.    Marland Kitchen ipratropium (ATROVENT) 0.03 % nasal spray Place 1 spray into both nostrils 2 (two) times daily.    Marland Kitchen lisinopril (PRINIVIL,ZESTRIL) 2.5 MG tablet Take 2.5 mg by mouth daily.    Marland Kitchen loratadine (CLARITIN) 10 MG tablet Take 10 mg by mouth daily.     . magnesium gluconate (MAGONATE) 500 MG tablet Take 500 mg by mouth daily.     . metoprolol tartrate (LOPRESSOR) 25 MG tablet Take 12.5 mg by mouth 2 (two) times daily.     . Multiple Vitamins-Minerals (PRESERVISION AREDS 2) CAPS Take 2 capsules by mouth daily.    . nitroGLYCERIN (NITROSTAT) 0.4 MG SL tablet Place 1 tablet (0.4 mg total) under the tongue every 5 (five) minutes as needed for chest pain. 90 tablet 3  . Omega-3 Fatty Acids (FISH OIL PO) Take 1 capsule by mouth daily.      No current facility-administered medications for this visit.     Allergies:   Ranexa [ranolazine]; Penicillins; and Other   Social History:  The patient  reports that he quit smoking about 45 years ago. His smoking use included  cigarettes. He has a 8.00 pack-year smoking history. he has never used smokeless tobacco. He reports that he does not drink alcohol or use drugs.   Family History:  The patient's  family history includes COPD in his sister; Congestive Heart Failure in his mother; Coronary artery disease in his brother; Diabetes in his sister; Heart disease in his father; Lung cancer in his sister; Stroke in his brother.    ROS:  Please see the history of present illness.  Otherwise, review of systems is positive for back pain, easy bruising, snoring.  All other systems are reviewed and negative.    PHYSICAL EXAM: VS:  BP 134/66   Pulse (!) 50   Ht _0  (1.803 m)   Wt 216 lb 6.4 oz (98.2 kg)   BMI 30.18 kg/m  , BMI Body mass index is 30.18 kg/m. GEN: Well nourished, well developed, in no acute distress  HEENT: normal  Neck: no JVD, no masses. No carotid  bruits Cardiac: bradycardic and regular with 2/6 SEM at the RUSB, no diastolic murmur               Respiratory:  clear to auscultation bilaterally, normal work of breathing GI: soft, nontender, nondistended, + BS MS: no deformity or atrophy  Ext: no pretibial edema, pedal pulses 2+= bilaterally Skin: warm and dry, no rash Neuro:  Strength and sensation are intact Psych: euthymic mood, full affect  EKG:  EKG is ordered today. The ekg ordered today shows sinus brady 50 bpm, RBBB, age-indeterminate inferior infarct. No change from previous tracing.   Recent Labs: No results found for requested labs within last 8760 hours.   Lipid Panel     Component Value Date/Time   CHOL 89 (L) 08/27/2016 1529   TRIG 106 08/27/2016 1529   HDL 43 08/27/2016 1529   CHOLHDL 2.1 08/27/2016 1529   VLDL 21 08/27/2016 1529   LDLCALC 25 08/27/2016 1529      Wt Readings from Last 3 Encounters:  11/08/17 216 lb 6.4 oz (98.2 kg)  05/02/17 213 lb 12.8 oz (97 kg)  11/02/16 209 lb 12.8 oz (95.2 kg)     Cardiac Studies Reviewed: 2D Echo 09-08-2016: Study  Conclusions  - Left ventricle: The cavity size was normal. Wall thickness was increased in a pattern of mild LVH. The estimated ejection fraction was 55%. Wall motion was normal; there were no regional wall motion abnormalities. Features are consistent with a pseudonormal left ventricular filling pattern, with concomitant abnormal relaxation and increased filling pressure (grade 2 diastolic dysfunction). - Aortic valve: There is a bioprosthetic aortic valve. The valve functions normally with no significant regurgitation or stenosis. Mean gradient (S): 8 mm Hg. - Mitral valve: There was trivial regurgitation. - Left atrium: The atrium was mildly dilated. - Right ventricle: The cavity size was mildly dilated. Systolic function was normal. - Right atrium: The atrium was mildly dilated. - Tricuspid valve: Peak RV-RA gradient (S): 21 mm Hg. - Pulmonary arteries: PA peak pressure: 24 mm Hg (S). - Inferior vena cava: The vessel was normal in size. The respirophasic diameter changes were in the normal range (>= 50%), consistent with normal central venous pressure.  Impressions:  - Normal size with mild LV hypertrophy. EF 55%. Moderate diastolic dysfunction. Mildly dilated RV with normal systolic function. Normal bioprosthetic aortic valve.  ASSESSMENT AND PLAN: 1.  Coronary artery disease, native vessel, with angina: The patient appears stable.  He will continue on aspirin for antiplatelet therapy, a high intensity statin drug, and a beta-blocker.  I will see him back in 6 months.  2.  Aortic valve disease: Patient is status post bioprosthetic aortic valve replacement with New York Heart Association functional class I symptoms.  Will continue current medications.  He understands the need for SBE prophylaxis.  3.  Hyperlipidemia: Treated with atorvastatin 40 mg daily.  Lipids are followed through the [place] system.  Today he is advised to discontinue lasix and  potassium. On other changes are made in his medical program.   Current medicines are reviewed with the patient today.  The patient does not have concerns regarding medicines.  Labs/ tests ordered today include:   Orders Placed This Encounter  Procedures  . EKG 12-Lead    Disposition:   FU 6 months  Signed, Janne Lab, MD  11/08/2017 5:40 PM    [facility] [place], [place], [place]  [address] Phone: [phone]; Fax: [phone]

## 2017-11-08 NOTE — Patient Instructions (Signed)
Medication Instructions:  1) STOP LASIX 2) STOP POTASSIUM  Labwork: None  Testing/Procedures: None  Follow-Up: Your provider wants you to follow-up in: 6 months with Dr. Burt Knack. You will receive a reminder letter in the mail two months in advance. If you don't receive a letter, please call our office to schedule the follow-up appointment.    Any Other Special Instructions Will Be Listed Below (If Applicable).     If you need a refill on your cardiac medications before your next appointment, please call your pharmacy.

## 2018-01-02 ENCOUNTER — Ambulatory Visit: Payer: PPO | Admitting: Cardiovascular Disease

## 2018-05-11 ENCOUNTER — Encounter: Payer: Self-pay | Admitting: Cardiovascular Disease

## 2018-05-18 ENCOUNTER — Ambulatory Visit: Payer: PPO | Admitting: Cardiovascular Disease

## 2018-06-08 NOTE — Progress Notes (Signed)
Cardiology Office Note    Date:  06/09/2018   ID:  Travis Armstrong, DOB 31-May-1941, MRN [id number]  PCP:  Christain Sacramento, MD  Cardiologist:  Dr. Burt Knack   Chief Complaint: 6 Months follow up  History of Present Illness:   Travis Armstrong is a 76 y.o. male with history of bicuspid aortic valve stenosis s/p AVR, coronary artery disease s/p multiple stent & CABG , HTN and HLD presented for follow up.   He initially presented with exertional angina and was found to have severe mid LAD stenosis which was managed medically because of the small caliber vessel. He returned in 2012 with recurrent chest pain and was found to have progressive disease treated with PCI. Chest pain recurred the following year demonstrating critical stenosis of the proximal LAD at the stent margin. PCI was again performed using a drug-eluting stent in overlapping fashion over the proximal stent edge. The patient then went on to develop progressive symptoms of aortic stenosis related to his bicuspid aortic valve as well as recurrent ischemic symptoms again demonstrating in-stent restenosis in 2015. The patient ultimately was treated with bioprosthetic aortic valve replacement and LIMA to LAD bypass in May 2015. He's had episodes of recurrent angina since undergoing surgery and repeat cardiac catheterization was performed in 2017 demonstrating patency of the ramus intermedius, RCA, and LIMA to LAD graft. There was tight ostial stenosis of the small AV circumflex with medical therapy recommended. Patient has had difficulty tolerating antianginal therapy because of side effects.  He was doing well on cardiac stand point when last seen by Dr. Burt Knack 10/2017.  Here today for follow-up.  Patient had a 3 episodes of atypical left-sided chest pain which he described as cramping like sensation.  First 2 episode occurred while he was sitting and lasted for 15 to 20 minutes.  Third episode occurred after he ride lawnmower.  No associated  shortness of breath, nausea, vomiting, diaphoresis or radiation of pain.  All episodes are different than prior angina, at that time he had a substernal chest pressure.  He did not require any nitroglycerin.  He denies orthopnea, PND, syncope, lower extremity edema or melena.  He plays golf 2 times per week, does bowling 2 times per week and walk 1-1/2  mile every day without chest pain or shortness of breath however has noted slowing down.  He attributes this to chronic back pain and age.  Past Medical History:  Diagnosis Date  . Anginal pain (HCC)    has not taken nitro  . Aortic stenosis    a. bicuspid aortic valve;  b.  s/p AVR 04/2014 - 25 mm [facility] Ease bovine pericardial tissue valve  . Arthritis    back  . Bradycardia, sinus   . CAD (coronary artery disease)    s/p CABG 04/2014 - LIMA to LAD  . History of pneumonia 1958  . History of skin cancer   . HTN (hypertension)   . Hx of echocardiogram    a. Echo (05/2014):  Mild focal basal septal hypertrophy, EF 55-60%, no RWMA, Gr 1 DD, AVR ok, MAC, mild LAE, mild RVE, mild RAE.  Marland Kitchen Hyperlipidemia   . Keratosis, actinic   . Muscle strain     Past Surgical History:  Procedure Laterality Date  . AORTIC VALVE REPLACEMENT N/A 05/03/2014   Procedure: AORTIC VALVE REPLACEMENT (AVR);  Surgeon: Rexene Alberts, MD;  Location: [place];  Service: Open Heart Surgery;  Laterality: N/A;  . CARDIAC  CATHETERIZATION N/A 02/25/2016   Procedure: Left Heart Cath and Coronary Angiography;  Surgeon: Larey Dresser, MD;  Location: [place] CV [facility];  Service: Cardiovascular;  Laterality: N/A;  . CORONARY ANGIOPLASTY WITH STENT PLACEMENT  11/23/2011   DES mid LAD  . CORONARY ANGIOPLASTY WITH STENT PLACEMENT  10/02/2012   DES prox LAD  . CORONARY ARTERY BYPASS GRAFT N/A 05/03/2014   Procedure: CORONARY ARTERY BYPASS GRAFTING (CABG);  Surgeon: Rexene Alberts, MD;  Location: [place];  Service: Open Heart Surgery;  Laterality: N/A;  Times 1 using left internal  mammary artery.  . INGUINAL HERNIA REPAIR  ~ 2007   left  . INTRAOPERATIVE TRANSESOPHAGEAL ECHOCARDIOGRAM N/A 05/03/2014   Procedure: INTRAOPERATIVE TRANSESOPHAGEAL ECHOCARDIOGRAM;  Surgeon: Rexene Alberts, MD;  Location: [place];  Service: Open Heart Surgery;  Laterality: N/A;  . LEFT HEART CATHETERIZATION WITH CORONARY ANGIOGRAM N/A 04/12/2014   Procedure: LEFT HEART CATHETERIZATION WITH CORONARY ANGIOGRAM;  Surgeon: Larey Dresser, MD;  Location: [facility] CATH LAB;  Service: Cardiovascular;  Laterality: N/A;  . PERCUTANEOUS CORONARY STENT INTERVENTION (PCI-S) N/A 11/23/2011   Procedure: PERCUTANEOUS CORONARY STENT INTERVENTION (PCI-S);  Surgeon: Peter M Martinique, MD;  Location: [facility] CATH LAB;  Service: Cardiovascular;  Laterality: N/A;  . PERCUTANEOUS CORONARY STENT INTERVENTION (PCI-S) N/A 10/02/2012   Procedure: PERCUTANEOUS CORONARY STENT INTERVENTION (PCI-S);  Surgeon: Hillary Bow, MD;  Location[facility] CATH LAB;  Service: Cardiovascular;  Laterality: N/A;  . TEE WITHOUT CARDIOVERSION N/A 01/15/2014   Procedure: TRANSESOPHAGEAL ECHOCARDIOGRAM (TEE);  Surgeon: Larey Dresser, MD;  Location: [facility] ENDOSCOPY;  Service: Cardiovascular;  Laterality: N/A;    Current Medications: Prior to Admission medications   Medication Sig Start Date End Date Taking? Authorizing Provider  aspirin 81 MG tablet Take 81 mg by mouth daily.     [provider]  atorvastatin (LIPITOR) 80 MG tablet Take 40 mg by mouth daily.    [provider]  calcium carbonate (OS-CAL) 600 MG TABS tablet Take 600 mg by mouth daily with breakfast.    [provider]  gabapentin (NEURONTIN) 300 MG capsule Take 300 mg by mouth 3 (three) times daily.    [provider]  ipratropium (ATROVENT) 0.03 % nasal spray Place 1 spray into both nostrils 2 (two) times daily.    [provider]  lisinopril (PRINIVIL,ZESTRIL) 2.5 MG tablet Take 2.5 mg by mouth daily.    [provider]  loratadine  (CLARITIN) 10 MG tablet Take 10 mg by mouth daily.     [provider]  magnesium gluconate (MAGONATE) 500 MG tablet Take 500 mg by mouth daily.     [provider]  metoprolol tartrate (LOPRESSOR) 25 MG tablet Take 12.5 mg by mouth 2 (two) times daily.     [provider]  Multiple Vitamins-Minerals (PRESERVISION AREDS 2) CAPS Take 2 capsules by mouth daily.    [provider]  nitroGLYCERIN (NITROSTAT) 0.4 MG SL tablet Place 1 tablet (0.4 mg total) under the tongue every 5 (five) minutes as needed for chest pain. 09/23/14   Larey Dresser, MD  Omega-3 Fatty Acids (FISH OIL PO) Take 1 capsule by mouth daily.     [provider]    Allergies:   Ranexa [ranolazine]; Penicillins; and Other   Social History   Socioeconomic History  . Marital status: Married    Spouse name: Not on file  . Number of children: Not on file  . Years of education: Not on file  .  Highest education level: Not on file  Occupational History  . Not on file  Social Needs  . Financial resource strain: Not on file  . Food insecurity:    Worry: Not on file    Inability: Not on file  . Transportation needs:    Medical: Not on file    Non-medical: Not on file  Tobacco Use  . Smoking status: Former Smoker    Packs/day: 1.00    Years: 8.00    Pack years: 8.00    Types: Cigarettes    Last attempt to quit: 03/28/1972    Years since quitting: 46.2  . Smokeless tobacco: Never Used  Substance and Sexual Activity  . Alcohol use: No    Comment: "last alcohol was ~ 1990's"  . Drug use: No  . Sexual activity: Not Currently    Comment: "stopped smoking cigarettes 1973"  Lifestyle  . Physical activity:    Days per week: Not on file    Minutes per session: Not on file  . Stress: Not on file  Relationships  . Social connections:    Talks on phone: Not on file    Gets together: Not on file    Attends religious service: Not on file    Active member of club or organization:  Not on file    Attends meetings of clubs or organizations: Not on file    Relationship status: Not on file  Other Topics Concern  . Not on file  Social History Narrative   Lives in [place] with his wife.  Retired.     Family History:  The patient's family history includes COPD in his sister; Congestive Heart Failure in his mother; Coronary artery disease in his brother; Diabetes in his sister; Heart disease in his father; Lung cancer in his sister; Stroke in his brother.   ROS:   Please see the history of present illness.    ROS All other systems reviewed and are negative.   PHYSICAL EXAM:   VS:  BP 130/70   Pulse 76   Ht _0  (1.803 m)   Wt 213 lb 6.4 oz (96.8 kg)   SpO2 97%   BMI 29.76 kg/m    GEN: Well nourished, well developed, in no acute distress  HEENT: normal  Neck: no JVD, carotid bruits, or masses Cardiac: RRR; no murmurs, rubs, or gallops,no edema  Respiratory:  clear to auscultation bilaterally, normal work of breathing GI: soft, nontender, nondistended, + BS MS: no deformity or atrophy  Skin: warm and dry, no rash Neuro:  Alert and Oriented x 3, Strength and sensation are intact Psych: euthymic mood, full affect  Wt Readings from Last 3 Encounters:  06/09/18 213 lb 6.4 oz (96.8 kg)  11/08/17 216 lb 6.4 oz (98.2 kg)  05/02/17 213 lb 12.8 oz (97 kg)      Studies/Labs Reviewed:   EKG:  EKG is ordered today.  The ekg ordered today demonstrates sinus bradycardia at rate of 51 bpm and incomplete right branch block  Recent Labs: No results found for requested labs within last 8760 hours.   Lipid Panel    Component Value Date/Time   CHOL 89 (L) 08/27/2016 1529   TRIG 106 08/27/2016 1529   HDL 43 08/27/2016 1529   CHOLHDL 2.1 08/27/2016 1529   VLDL 21 08/27/2016 1529   LDLCALC 25 08/27/2016 1529    Additional studies/ records that were reviewed today include:   2D Echo 09-08-2016: Study Conclusions  - Left ventricle: The  cavity size was  normal. Wall thickness was increased in a pattern of mild LVH. The estimated ejection fraction was 55%. Wall motion was normal; there were no regional wall motion abnormalities. Features are consistent with a pseudonormal left ventricular filling pattern, with concomitant abnormal relaxation and increased filling pressure (grade 2 diastolic dysfunction). - Aortic valve: There is a bioprosthetic aortic valve. The valve functions normally with no significant regurgitation or stenosis. Mean gradient (S): 8 mm Hg. - Mitral valve: There was trivial regurgitation. - Left atrium: The atrium was mildly dilated. - Right ventricle: The cavity size was mildly dilated. Systolic function was normal. - Right atrium: The atrium was mildly dilated. - Tricuspid valve: Peak RV-RA gradient (S): 21 mm Hg. - Pulmonary arteries: PA peak pressure: 24 mm Hg (S). - Inferior vena cava: The vessel was normal in size. The respirophasic diameter changes were in the normal range (>= 50%), consistent with normal central venous pressure.  Impressions:  - Normal size with mild LV hypertrophy. EF 55%. Moderate diastolic dysfunction. Mildly dilated RV with normal systolic function. Normal bioprosthetic aortic valve.  Left Heart Cath and Coronary Angiography 02/25/16  Conclusion   1. Patent RCA, ramus, and LIMA-LAD.   2. 90% ostial stenosis of relatively small AV LCx.  This vessel does not cover much territory (ramus covers most of lateral wall).  This would be difficult to intervene upon and covers small territory, so decided on ongoing medical management (reviewed with Dr Tamala Julian). 3. Jailed small D2 with 90% ostial stenosis.  4. Distal LAD disease.   No good interventional option.  He does have possible sources of angina though these vessels only cover small territories.  Would continue medical management.  HR in 50s so will not increase metoprolol.  He has not tolerated Imdur or  ranolazine. I will have him start amlodipine 2.5 mg daily.       ASSESSMENT & PLAN:   1. CAD -He had a 3 episode of left-sided cramp like sensation.  Symptoms different from prior angina.  Recently noted exertional slowing down without chest pain or shortness of breath.  Never required any sublingual nitroglycerin.  Discussed possible evaluation with stress test however he wants to defer for now.  He will let us know if worsening of symptoms.  Continue current medical therapy.  2. S/p bioprosthetic aortic valve replacement -Denies dyspnea.  No significant murmur heard.  3. HLD -Followed at [place].  Continue statin.  4. HTN -Stable on current medication.   Medication Adjustments/Labs and Tests Ordered: Current medicines are reviewed at length with the patient today.  Concerns regarding medicines are outlined above.  Medication changes, Labs and Tests ordered today are listed in the Patient Instructions below. Patient Instructions  Medication Instructions:  Your physician recommends that you continue on your current medications as directed. Please refer to the Current Medication list given to you today.   Labwork: None ordered  Testing/Procedures:   Follow-Up: Your physician recommends that you schedule a follow-up appointment in: 11/03/18 ARRIVE AT 7:45 TO SEE DR. Burt Knack   Any Other Special Instructions Will Be Listed Below (If Applicable).     If you need a refill on your cardiac medications before your next appointment, please call your pharmacy.      Jarrett Soho, [place]  06/09/2018 8:42 AM    [address], [place], [place]  [address] Phone: [phone]; Fax: [phone]

## 2018-06-09 ENCOUNTER — Ambulatory Visit: Payer: PPO | Admitting: Physician Assistant

## 2018-06-09 ENCOUNTER — Encounter: Payer: Self-pay | Admitting: Physician Assistant

## 2018-06-09 ENCOUNTER — Encounter (INDEPENDENT_AMBULATORY_CARE_PROVIDER_SITE_OTHER): Payer: Self-pay

## 2018-06-09 ENCOUNTER — Encounter

## 2018-06-09 VITALS — BP 130/70 | HR 76 | Ht 71.0 in | Wt 213.4 lb

## 2018-06-09 DIAGNOSIS — R079 Chest pain, unspecified: Secondary | ICD-10-CM

## 2018-06-09 DIAGNOSIS — E782 Mixed hyperlipidemia: Secondary | ICD-10-CM | POA: Diagnosis not present

## 2018-06-09 DIAGNOSIS — I359 Nonrheumatic aortic valve disorder, unspecified: Secondary | ICD-10-CM | POA: Diagnosis not present

## 2018-06-09 DIAGNOSIS — Z953 Presence of xenogenic heart valve: Secondary | ICD-10-CM

## 2018-06-09 DIAGNOSIS — I25119 Atherosclerotic heart disease of native coronary artery with unspecified angina pectoris: Secondary | ICD-10-CM | POA: Diagnosis not present

## 2018-06-09 NOTE — Patient Instructions (Addendum)
Medication Instructions:  Your physician recommends that you continue on your current medications as directed. Please refer to the Current Medication list given to you today.   Labwork: None ordered  Testing/Procedures:   Follow-Up: Your physician recommends that you schedule a follow-up appointment in: 11/03/18 ARRIVE AT 7:45 TO SEE DR. Burt Knack   Any Other Special Instructions Will Be Listed Below (If Applicable).     If you need a refill on your cardiac medications before your next appointment, please call your pharmacy.

## 2018-11-03 ENCOUNTER — Encounter: Payer: Self-pay | Admitting: Cardiovascular Disease

## 2018-11-03 ENCOUNTER — Ambulatory Visit: Payer: PPO | Admitting: Cardiovascular Disease

## 2018-11-03 VITALS — BP 118/60 | HR 52 | Ht 71.0 in | Wt 214.6 lb

## 2018-11-03 DIAGNOSIS — E782 Mixed hyperlipidemia: Secondary | ICD-10-CM | POA: Diagnosis not present

## 2018-11-03 DIAGNOSIS — I359 Nonrheumatic aortic valve disorder, unspecified: Secondary | ICD-10-CM

## 2018-11-03 DIAGNOSIS — I25119 Atherosclerotic heart disease of native coronary artery with unspecified angina pectoris: Secondary | ICD-10-CM | POA: Diagnosis not present

## 2018-11-03 NOTE — Progress Notes (Signed)
Cardiology Office Note:    Date:  11/03/2018   ID:  Travis Armstrong, DOB May 05, 1941, MRN [id number]  PCP:  Christain Sacramento, MD  Cardiologist:  No primary care provider on file.  Electrophysiologist:  None   Referring MD: Christain Sacramento, MD   Chief Complaint  Patient presents with  . Coronary Artery Disease    History of Present Illness:    Travis Armstrong is a 77 y.o. male with a hx of coronary artery disease and aortic valve disease, presenting for follow-up evaluation.  He has a history of LAD stenting most recently in 2012.  He developed progressive symptoms of aortic stenosis and recurrent angina in 2015 and was ultimately treated with a bioprosthetic aortic valve replacement and LIMA to LAD grafting.  He underwent cardiac catheterization in 2017 with recurrent chest pain and this demonstrated patency of his LIMA graft, no obstructive disease in the RCA or ramus intermedius, and tight ostial stenosis of a small AV circumflex with medical therapy recommended.  Patient is here alone today.  He is been doing fairly well from a cardiac perspective.  His walking has become limited from low back problems and gait instability.  He reports rare episodes of chest discomfort that have been self-limited.  There is no clear exertional component.  He has not required nitroglycerin.  There is no change in pattern over the years.  He has mild exertional dyspnea with prolonged walking.  No orthopnea, PND, leg swelling, heart palpitations, lightheadedness, or syncope.  Past Medical History:  Diagnosis Date  . Anginal pain (HCC)    has not taken nitro  . Aortic stenosis    a. bicuspid aortic valve;  b.  s/p AVR 04/2014 - 25 mm [facility] Ease bovine pericardial tissue valve  . Arthritis    back  . Bradycardia, sinus   . CAD (coronary artery disease)    s/p CABG 04/2014 - LIMA to LAD  . History of pneumonia 1958  . History of skin cancer   . HTN (hypertension)   . Hx of echocardiogram    a. Echo  (05/2014):  Mild focal basal septal hypertrophy, EF 55-60%, no RWMA, Gr 1 DD, AVR ok, MAC, mild LAE, mild RVE, mild RAE.  Marland Kitchen Hyperlipidemia   . Keratosis, actinic   . Muscle strain     Past Surgical History:  Procedure Laterality Date  . AORTIC VALVE REPLACEMENT N/A 05/03/2014   Procedure: AORTIC VALVE REPLACEMENT (AVR);  Surgeon: Rexene Alberts, MD;  Location: [facility];  Service: Open Heart Surgery;  Laterality: N/A;  . CARDIAC CATHETERIZATION N/A 02/25/2016   Procedure: Left Heart Cath and Coronary Angiography;  Surgeon: Larey Dresser, MD;  Location: [facility] CV [facility];  Service: Cardiovascular;  Laterality: N/A;  . CORONARY ANGIOPLASTY WITH STENT PLACEMENT  11/23/2011   DES mid LAD  . CORONARY ANGIOPLASTY WITH STENT PLACEMENT  10/02/2012   DES prox LAD  . CORONARY ARTERY BYPASS GRAFT N/A 05/03/2014   Procedure: CORONARY ARTERY BYPASS GRAFTING (CABG);  Surgeon: Rexene Alberts, MD;  Location: [facility];  Service: Open Heart Surgery;  Laterality: N/A;  Times 1 using left internal mammary artery.  . INGUINAL HERNIA REPAIR  ~ 2007   left  . INTRAOPERATIVE TRANSESOPHAGEAL ECHOCARDIOGRAM N/A 05/03/2014   Procedure: INTRAOPERATIVE TRANSESOPHAGEAL ECHOCARDIOGRAM;  Surgeon: Rexene Alberts, MD;  Location: [facility];  Service: Open Heart Surgery;  Laterality: N/A;  . LEFT HEART CATHETERIZATION WITH CORONARY ANGIOGRAM N/A 04/12/2014   Procedure:  LEFT HEART CATHETERIZATION WITH CORONARY ANGIOGRAM;  Surgeon: Larey Dresser, MD;  Location: [facility] CATH LAB;  Service: Cardiovascular;  Laterality: N/A;  . PERCUTANEOUS CORONARY STENT INTERVENTION (PCI-S) N/A 11/23/2011   Procedure: PERCUTANEOUS CORONARY STENT INTERVENTION (PCI-S);  Surgeon: Peter M Martinique, MD;  Location: [facility] CATH LAB;  Service: Cardiovascular;  Laterality: N/A;  . PERCUTANEOUS CORONARY STENT INTERVENTION (PCI-S) N/A 10/02/2012   Procedure: PERCUTANEOUS CORONARY STENT INTERVENTION (PCI-S);  Surgeon: Hillary Bow, MD;  Location: [facility] CATH LAB;  Service:  Cardiovascular;  Laterality: N/A;  . TEE WITHOUT CARDIOVERSION N/A 01/15/2014   Procedure: TRANSESOPHAGEAL ECHOCARDIOGRAM (TEE);  Surgeon: Larey Dresser, MD;  Location: [facility] ENDOSCOPY;  Service: Cardiovascular;  Laterality: N/A;    Current Medications: Current Meds  Medication Sig  . aspirin 81 MG tablet Take 81 mg by mouth daily.   Marland Kitchen atorvastatin (LIPITOR) 80 MG tablet Take 40 mg by mouth daily.  . calcium carbonate (OS-CAL) 600 MG TABS tablet Take 600 mg by mouth daily with breakfast.  . gabapentin (NEURONTIN) 300 MG capsule Take 300 mg by mouth 3 (three) times daily.  Marland Kitchen ipratropium (ATROVENT) 0.03 % nasal spray Place 1 spray into both nostrils 2 (two) times daily.  Marland Kitchen lisinopril (PRINIVIL,ZESTRIL) 2.5 MG tablet Take 2.5 mg by mouth daily.  Marland Kitchen loratadine (CLARITIN) 10 MG tablet Take 10 mg by mouth daily.   . magnesium gluconate (MAGONATE) 500 MG tablet Take 500 mg by mouth daily.   . metoprolol tartrate (LOPRESSOR) 25 MG tablet Take 12.5 mg by mouth 2 (two) times daily.   . Multiple Vitamins-Minerals (PRESERVISION AREDS 2) CAPS Take 2 capsules by mouth daily.  . nitroGLYCERIN (NITROSTAT) 0.4 MG SL tablet Place 1 tablet (0.4 mg total) under the tongue every 5 (five) minutes as needed for chest pain.  . Omega-3 Fatty Acids (FISH OIL PO) Take 1 capsule by mouth daily.      Allergies:   Ranexa [ranolazine]; Penicillins; and Other   Social History   Socioeconomic History  . Marital status: Married    Spouse name: Not on file  . Number of children: Not on file  . Years of education: Not on file  . Highest education level: Not on file  Occupational History  . Not on file  Social Needs  . Financial resource strain: Not on file  . Food insecurity:    Worry: Not on file    Inability: Not on file  . Transportation needs:    Medical: Not on file    Non-medical: Not on file  Tobacco Use  . Smoking status: Former Smoker    Packs/day: 1.00    Years: 8.00    Pack years: 8.00    Types:  Cigarettes    Last attempt to quit: 03/28/1972    Years since quitting: 46.6  . Smokeless tobacco: Never Used  Substance and Sexual Activity  . Alcohol use: No    Comment: "last alcohol was ~ 1990's"  . Drug use: No  . Sexual activity: Not Currently    Comment: "stopped smoking cigarettes 1973"  Lifestyle  . Physical activity:    Days per week: Not on file    Minutes per session: Not on file  . Stress: Not on file  Relationships  . Social connections:    Talks on phone: Not on file    Gets together: Not on file    Attends religious service: Not on file    Active member of club or organization: Not on file  Attends meetings of clubs or organizations: Not on file    Relationship status: Not on file  Other Topics Concern  . Not on file  Social History Narrative   Lives in [place] with his wife.  Retired.     Family History: The patient's family history includes COPD in his sister; Congestive Heart Failure in his mother; Coronary artery disease in his brother; Diabetes in his sister; Heart disease in his father; Lung cancer in his sister; Stroke in his brother. There is no history of Colon cancer, Esophageal cancer, Stomach cancer, or Rectal cancer.  ROS:   Please see the history of present illness.    Positive for back pain, gait instability, muscle pain, easy bruising.  All other systems reviewed and are negative.  EKGs/Labs/Other Studies Reviewed:    The following studies were reviewed today: Echo 09-08-2016: Study Conclusions  - Left ventricle: The cavity size was normal. Wall thickness was   increased in a pattern of mild LVH. The estimated ejection   fraction was 55%. Wall motion was normal; there were no regional   wall motion abnormalities. Features are consistent with a   pseudonormal left ventricular filling pattern, with concomitant   abnormal relaxation and increased filling pressure (grade 2   diastolic dysfunction). - Aortic valve: There is a  bioprosthetic aortic valve. The valve   functions normally with no significant regurgitation or stenosis.   Mean gradient (S): 8 mm Hg. - Mitral valve: There was trivial regurgitation. - Left atrium: The atrium was mildly dilated. - Right ventricle: The cavity size was mildly dilated. Systolic   function was normal. - Right atrium: The atrium was mildly dilated. - Tricuspid valve: Peak RV-RA gradient (S): 21 mm Hg. - Pulmonary arteries: PA peak pressure: 24 mm Hg (S). - Inferior vena cava: The vessel was normal in size. The   respirophasic diameter changes were in the normal range (>= 50%),   consistent with normal central venous pressure.  Impressions:  - Normal size with mild LV hypertrophy. EF 55%. Moderate diastolic   dysfunction. Mildly dilated RV with normal systolic function.   Normal bioprosthetic aortic valve.  EKG:  EKG is not ordered today.  The EKG from June 2019 is reviewed and demonstrates sinus bradycardia with right bundle branch block and age-indeterminate inferior infarct, no significant change from previous tracing.  Recent Labs: No results found for requested labs within last 8760 hours.  Recent Lipid Panel    Component Value Date/Time   CHOL 89 (L) 08/27/2016 1529   TRIG 106 08/27/2016 1529   HDL 43 08/27/2016 1529   CHOLHDL 2.1 08/27/2016 1529   VLDL 21 08/27/2016 1529   LDLCALC 25 08/27/2016 1529    Physical Exam:    VS:  BP 118/60   Pulse (!) 52   Ht _0  (1.803 m)   Wt 214 lb 9.6 oz (97.3 kg)   SpO2 97%   BMI 29.93 kg/m     Wt Readings from Last 3 Encounters:  11/03/18 214 lb 9.6 oz (97.3 kg)  06/09/18 213 lb 6.4 oz (96.8 kg)  11/08/17 216 lb 6.4 oz (98.2 kg)     GEN: Pleasant elderly male in no acute distress HEENT: Normal NECK: No JVD; bilateral carotid bruits LYMPHATICS: No lymphadenopathy CARDIAC: RRR, 2/6 early peaking systolic ejection murmur at the right upper sternal border RESPIRATORY:  Clear to auscultation without rales,  wheezing or rhonchi  ABDOMEN: Soft, non-tender, non-distended MUSCULOSKELETAL:  No edema; No deformity  SKIN: Warm  and dry NEUROLOGIC:  Alert and oriented x 3 PSYCHIATRIC:  Normal affect   ASSESSMENT:    1. Aortic valve disorder   2. Coronary artery disease involving native coronary artery of native heart with angina pectoris ([place])   3. Mixed hyperlipidemia    PLAN:    In order of problems listed above:  1. Mild dyspnea but no signs of congestive heart failure.  Exam with an expected systolic murmur.  Recommend repeat echocardiogram next year prior to his office visit. 2. Stable symptoms on aspirin, beta-blocker, ACE inhibitor, and high intensity statin drug. 3. Labs are followed through the [facility] health system.  Medication Adjustments/Labs and Tests Ordered: Current medicines are reviewed at length with the patient today.  Concerns regarding medicines are outlined above.  Orders Placed This Encounter  Procedures  . ECHOCARDIOGRAM COMPLETE   No orders of the defined types were placed in this encounter.   Patient Instructions  Medication Instructions:  Your provider recommends that you continue on your current medications as directed. Please refer to the Current Medication list given to you today.    Labwork: None  Testing/Procedures: Your provider has requested that you have an echocardiogram in 1 year. Echocardiography is a painless test that uses sound waves to create images of your heart. It provides your doctor with information about the size and shape of your heart and how well your heart's chambers and valves are working. This procedure takes approximately one hour. There are no restrictions for this procedure.    Follow-Up: You will be called to arrange your echo and office visit due next November.   Any Other Special Instructions Will Be Listed Below (If Applicable).     If you need a refill on your cardiac medications before your next appointment, please call  your pharmacy.      Signed, Sherren Mocha, MD  11/03/2018 12:42 PM    [place]

## 2018-11-03 NOTE — Patient Instructions (Signed)
Medication Instructions:  Your provider recommends that you continue on your current medications as directed. Please refer to the Current Medication list given to you today.    Labwork: None  Testing/Procedures: Your provider has requested that you have an echocardiogram in 1 year. Echocardiography is a painless test that uses sound waves to create images of your heart. It provides your doctor with information about the size and shape of your heart and how well your heart's chambers and valves are working. This procedure takes approximately one hour. There are no restrictions for this procedure.    Follow-Up: You will be called to arrange your echo and office visit due next November.   Any Other Special Instructions Will Be Listed Below (If Applicable).     If you need a refill on your cardiac medications before your next appointment, please call your pharmacy.

## 2019-01-20 DIAGNOSIS — R1310 Dysphagia, unspecified: Secondary | ICD-10-CM | POA: Diagnosis not present

## 2019-11-09 ENCOUNTER — Other Ambulatory Visit: Payer: Self-pay

## 2019-11-09 ENCOUNTER — Ambulatory Visit (HOSPITAL_COMMUNITY): Payer: PPO | Attending: Cardiovascular Disease

## 2019-11-09 DIAGNOSIS — I359 Nonrheumatic aortic valve disorder, unspecified: Secondary | ICD-10-CM | POA: Diagnosis not present

## 2019-11-12 ENCOUNTER — Encounter: Payer: Self-pay | Admitting: Cardiovascular Disease

## 2019-11-12 ENCOUNTER — Ambulatory Visit: Payer: PPO | Admitting: Cardiovascular Disease

## 2019-11-12 ENCOUNTER — Other Ambulatory Visit: Payer: Self-pay

## 2019-11-12 ENCOUNTER — Encounter (INDEPENDENT_AMBULATORY_CARE_PROVIDER_SITE_OTHER): Payer: Self-pay

## 2019-11-12 VITALS — BP 118/58 | HR 51 | Ht 71.0 in | Wt 199.8 lb

## 2019-11-12 DIAGNOSIS — I10 Essential (primary) hypertension: Secondary | ICD-10-CM

## 2019-11-12 DIAGNOSIS — I25119 Atherosclerotic heart disease of native coronary artery with unspecified angina pectoris: Secondary | ICD-10-CM | POA: Diagnosis not present

## 2019-11-12 DIAGNOSIS — I359 Nonrheumatic aortic valve disorder, unspecified: Secondary | ICD-10-CM | POA: Diagnosis not present

## 2019-11-12 DIAGNOSIS — E782 Mixed hyperlipidemia: Secondary | ICD-10-CM

## 2019-11-12 NOTE — Progress Notes (Signed)
Cardiology Office Note:    Date:  11/12/2019   ID:  Travis Armstrong, DOB 1941-04-10, MRN [id number]  PCP:  Christain Sacramento, MD  Cardiologist:  Sherren Mocha, MD  Electrophysiologist:  None   Referring MD: Christain Sacramento, MD   Chief Complaint  Patient presents with  . Coronary Artery Disease    History of Present Illness:    Travis Armstrong is a 78 y.o. male with a hx of coronary artery disease and aortic valve disease, presenting for follow-up evaluation.  He has a history of LAD stenting most recently in 2012.  He developed progressive symptoms of aortic stenosis and recurrent angina in 2015 and was ultimately treated with a bioprosthetic aortic valve replacement and LIMA to LAD grafting.  He underwent cardiac catheterization in 2017 with recurrent chest pain and this demonstrated patency of his LIMA graft, no obstructive disease in the RCA or ramus intermedius, and tight ostial stenosis of a small AV circumflex with medical therapy recommended.  The patient is here alone today.  He reports no change in his symptoms over the past year.  He has mild discomfort in the left upper chest and a localized region that can occur at rest or with walking.  He is primarily limited by back and hip problems.  His chest pain symptoms have not changed in several years and he is not particularly limited.  He denies dyspnea, orthopnea, PND, heart palpitations, or leg swelling.  His labs continue to be followed through the [place] system.  He had an echocardiogram to follow his bioprosthetic aortic valve function prior to his visit today.  Past Medical History:  Diagnosis Date  . Anginal pain (HCC)    has not taken nitro  . Aortic stenosis    a. bicuspid aortic valve;  b.  s/p AVR 04/2014 - 25 mm [facility] Ease bovine pericardial tissue valve  . Arthritis    back  . Bradycardia, sinus   . CAD (coronary artery disease)    s/p CABG 04/2014 - LIMA to LAD  . History of pneumonia 1958  . History of skin cancer    . HTN (hypertension)   . Hx of echocardiogram    a. Echo (05/2014):  Mild focal basal septal hypertrophy, EF 55-60%, no RWMA, Gr 1 DD, AVR ok, MAC, mild LAE, mild RVE, mild RAE.  Marland Kitchen Hyperlipidemia   . Keratosis, actinic   . Muscle strain     Past Surgical History:  Procedure Laterality Date  . AORTIC VALVE REPLACEMENT N/A 05/03/2014   Procedure: AORTIC VALVE REPLACEMENT (AVR);  Surgeon: Rexene Alberts, MD;  Location: [place];  Service: Open Heart Surgery;  Laterality: N/A;  . CARDIAC CATHETERIZATION N/A 02/25/2016   Procedure: Left Heart Cath and Coronary Angiography;  Surgeon: Larey Dresser, MD;  Location: [place] CV LAB;  Service: Cardiovascular;  Laterality: N/A;  . CORONARY ANGIOPLASTY WITH STENT PLACEMENT  11/23/2011   DES mid LAD  . CORONARY ANGIOPLASTY WITH STENT PLACEMENT  10/02/2012   DES prox LAD  . CORONARY ARTERY BYPASS GRAFT N/A 05/03/2014   Procedure: CORONARY ARTERY BYPASS GRAFTING (CABG);  Surgeon: Rexene Alberts, MD;  Location: [place];  Service: Open Heart Surgery;  Laterality: N/A;  Times 1 using left internal mammary artery.  . INGUINAL HERNIA REPAIR  ~ 2007   left  . INTRAOPERATIVE TRANSESOPHAGEAL ECHOCARDIOGRAM N/A 05/03/2014   Procedure: INTRAOPERATIVE TRANSESOPHAGEAL ECHOCARDIOGRAM;  Surgeon: Rexene Alberts, MD;  Location: [place];  Service:  Open Heart Surgery;  Laterality: N/A;  . LEFT HEART CATHETERIZATION WITH CORONARY ANGIOGRAM N/A 04/12/2014   Procedure: LEFT HEART CATHETERIZATION WITH CORONARY ANGIOGRAM;  Surgeon: Larey Dresser, MD;  Location: [facility] CATH LAB;  Service: Cardiovascular;  Laterality: N/A;  . PERCUTANEOUS CORONARY STENT INTERVENTION (PCI-S) N/A 11/23/2011   Procedure: PERCUTANEOUS CORONARY STENT INTERVENTION (PCI-S);  Surgeon: Peter M Martinique, MD;  Location: [facility] CATH LAB;  Service: Cardiovascular;  Laterality: N/A;  . PERCUTANEOUS CORONARY STENT INTERVENTION (PCI-S) N/A 10/02/2012   Procedure: PERCUTANEOUS CORONARY STENT INTERVENTION (PCI-S);  Surgeon:  Hillary Bow, MD;  Location: [facility] CATH LAB;  Service: Cardiovascular;  Laterality: N/A;  . TEE WITHOUT CARDIOVERSION N/A 01/15/2014   Procedure: TRANSESOPHAGEAL ECHOCARDIOGRAM (TEE);  Surgeon: Larey Dresser, MD;  Location: [facility] ENDOSCOPY;  Service: Cardiovascular;  Laterality: N/A;    Current Medications: Current Meds  Medication Sig  . aspirin 81 MG tablet Take 81 mg by mouth daily.   Marland Kitchen atorvastatin (LIPITOR) 80 MG tablet Take 40 mg by mouth daily.  . calcium carbonate (OS-CAL) 600 MG TABS tablet Take 600 mg by mouth daily with breakfast.  . gabapentin (NEURONTIN) 300 MG capsule Take 300 mg by mouth 3 (three) times daily.  Marland Kitchen ipratropium (ATROVENT) 0.03 % nasal spray Place 1 spray into both nostrils 2 (two) times daily.  Marland Kitchen lisinopril (PRINIVIL,ZESTRIL) 2.5 MG tablet Take 2.5 mg by mouth daily.  Marland Kitchen loratadine (CLARITIN) 10 MG tablet Take 10 mg by mouth daily.   . magnesium gluconate (MAGONATE) 500 MG tablet Take 500 mg by mouth daily.   . metoprolol tartrate (LOPRESSOR) 25 MG tablet Take 12.5 mg by mouth 2 (two) times daily.   . Multiple Vitamins-Minerals (PRESERVISION AREDS 2) CAPS Take 2 capsules by mouth daily.  . nitroGLYCERIN (NITROSTAT) 0.4 MG SL tablet Place 1 tablet (0.4 mg total) under the tongue every 5 (five) minutes as needed for chest pain.  . Omega-3 Fatty Acids (FISH OIL PO) Take 1 capsule by mouth daily.      Allergies:   Ranexa [ranolazine], Penicillins, and Other   Social History   Socioeconomic History  . Marital status: Married    Spouse name: Not on file  . Number of children: Not on file  . Years of education: Not on file  . Highest education level: Not on file  Occupational History  . Not on file  Social Needs  . Financial resource strain: Not on file  . Food insecurity    Worry: Not on file    Inability: Not on file  . Transportation needs    Medical: Not on file    Non-medical: Not on file  Tobacco Use  . Smoking status: Former Smoker    Packs/day:  1.00    Years: 8.00    Pack years: 8.00    Types: Cigarettes    Quit date: 03/28/1972    Years since quitting: 47.6  . Smokeless tobacco: Never Used  Substance and Sexual Activity  . Alcohol use: No    Comment: "last alcohol was ~ 1990's"  . Drug use: No  . Sexual activity: Not Currently    Comment: "stopped smoking cigarettes 1973"  Lifestyle  . Physical activity    Days per week: Not on file    Minutes per session: Not on file  . Stress: Not on file  Relationships  . Social Herbalist on phone: Not on file    Gets together: Not on file    Attends religious  service: Not on file    Active member of club or organization: Not on file    Attends meetings of clubs or organizations: Not on file    Relationship status: Not on file  Other Topics Concern  . Not on file  Social History Narrative   Lives in [place] with his wife.  Retired.     Family History: The patient's family history includes COPD in his sister; Congestive Heart Failure in his mother; Coronary artery disease in his brother; Diabetes in his sister; Heart disease in his father; Lung cancer in his sister; Stroke in his brother. There is no history of Colon cancer, Esophageal cancer, Stomach cancer, or Rectal cancer.  ROS:   Please see the history of present illness.    All other systems reviewed and are negative.  EKGs/Labs/Other Studies Reviewed:    The following studies were reviewed today: Echo 11-09-2019: IMPRESSIONS    1. Left ventricular ejection fraction, by visual estimation, is 55 to 60%. The left ventricle has normal function. Left ventricular septal wall thickness was mildly increased. There is no left ventricular hypertrophy.  2. Normal GLS -19.  3. Global right ventricle has normal systolic function.The right ventricular size is normal. No increase in right ventricular wall thickness.  4. Left atrial size was moderately dilated.  5. Right atrial size was normal.  6. Mild mitral  annular calcification.  7. Moderate calcification of the mitral valve leaflet(s).  8. Moderate thickening of the mitral valve leaflet(s).  9. The mitral valve is normal in structure. Mild mitral valve regurgitation. No evidence of mitral stenosis. 10. The tricuspid valve is normal in structure. Tricuspid valve regurgitation is mild. 11. Aortic valve regurgitation is not visualized. No evidence of aortic valve sclerosis or stenosis. 12. Bioprosthetic AVR with normal gradients and no PVL. 13. The pulmonic valve was grossly normal. Pulmonic valve regurgitation is mild. 14. Aortic dilatation noted. 15. There is mild dilatation of the aortic root measuring 38 mm. 16. Normal pulmonary artery systolic pressure. 17. The inferior vena cava is normal in size with greater than 50% respiratory variability, suggesting right atrial pressure of 3 mmHg.  FINDINGS  Left Ventricle: Left ventricular ejection fraction, by visual estimation, is 55 to 60%. The left ventricle has normal function. There is no left ventricular hypertrophy. Normal left atrial pressure. Normal GLS -19.  Right Ventricle: The right ventricular size is normal. No increase in right ventricular wall thickness. Global RV systolic function is has normal systolic function. The tricuspid regurgitant velocity is 2.40 m/s, and with an assumed right atrial pressure  of 3 mmHg, the estimated right ventricular systolic pressure is normal at 26.1 mmHg.  Left Atrium: Left atrial size was moderately dilated.  Right Atrium: Right atrial size was normal in size  Pericardium: There is no evidence of pericardial effusion.  Mitral Valve: The mitral valve is normal in structure. There is moderate thickening of the mitral valve leaflet(s). There is moderate calcification of the mitral valve leaflet(s). Mild mitral annular calcification. No evidence of mitral valve stenosis by  observation. Mild mitral valve regurgitation.  Tricuspid Valve: The  tricuspid valve is normal in structure. Tricuspid valve regurgitation is mild.  Aortic Valve: The aortic valve has been repaired/replaced. Aortic valve regurgitation is not visualized. The aortic valve is structurally normal, with no evidence of sclerosis or stenosis. Aortic valve mean gradient measures 10.5 mmHg. Aortic valve peak  gradient measures 20.3 mmHg. 29m Magna Ease valve is present in the aortic position. Procedure Date:  05/03/14. Bioprosthetic AVR with normal gradients and no PVL.  Pulmonic Valve: The pulmonic valve was grossly normal. Pulmonic valve regurgitation is mild.  Aorta: Aortic dilatation noted. There is mild dilatation of the aortic root measuring 38 mm.  Venous: The inferior vena cava is normal in size with greater than 50% respiratory variability, suggesting right atrial pressure of 3 mmHg.  IAS/Shunts: No atrial level shunt detected by color flow Doppler. No ventricular septal defect is seen or detected. There is no evidence of an atrial septal defect.  EKG:  EKG is ordered today.  The ekg ordered today demonstrates sinus bradycardia with right bundle branch block and age-indeterminate inferior infarct, no significant change from previous.  Recent Labs: No results found for requested labs within last 8760 hours.  Recent Lipid Panel    Component Value Date/Time   CHOL 89 (L) 08/27/2016 1529   TRIG 106 08/27/2016 1529   HDL 43 08/27/2016 1529   CHOLHDL 2.1 08/27/2016 1529   VLDL 21 08/27/2016 1529   LDLCALC 25 08/27/2016 1529    Physical Exam:    VS:  BP (!) 118/58   Pulse (!) 51   Ht _0  (1.803 m)   Wt 199 lb 12.8 oz (90.6 kg)   SpO2 98%   BMI 27.87 kg/m     Wt Readings from Last 3 Encounters:  11/12/19 199 lb 12.8 oz (90.6 kg)  11/03/18 214 lb 9.6 oz (97.3 kg)  06/09/18 213 lb 6.4 oz (96.8 kg)     GEN:  Well nourished, well developed in no acute distress HEENT: Normal NECK: No JVD; No carotid bruits LYMPHATICS: No lymphadenopathy  CARDIAC: RRR, 2/6 systolic murmur at the RUSB RESPIRATORY:  Clear to auscultation without rales, wheezing or rhonchi  ABDOMEN: Soft, non-tender, non-distended MUSCULOSKELETAL:  No edema; No deformity  SKIN: Warm and dry NEUROLOGIC:  Alert and oriented x 3 PSYCHIATRIC:  Normal affect   ASSESSMENT:    1. Aortic valve disorder   2. Coronary artery disease involving native coronary artery of native heart with angina pectoris ([place])   3. Mixed hyperlipidemia   4. Essential hypertension    PLAN:    In order of problems listed above:  1. Normal function of the aortic valve prosthesis on recent echo. SBE prophylaxis measures reviewed. Discussed natural hx of aortic bioprosthesis.  2. Mild exertional angina, but continued atypical symptoms at rest with no change over time. He's primarily limited by hip pain. Treated with a statin and beta blocker, ASA.  3. Reports total cholesterol 152 mg/dL. His labs are followed through the [place] system. Continue tx with a high-intensity statin drub (atorvastatin 40 mg). 4. BP control excellent on combination of lisinopril and metoprolol.  Medication Adjustments/Labs and Tests Ordered: Current medicines are reviewed at length with the patient today.  Concerns regarding medicines are outlined above.  Orders Placed This Encounter  Procedures  . EKG 12-Lead   No orders of the defined types were placed in this encounter.   Patient Instructions  Medication Instructions:  Your provider recommends that you continue on your current medications as directed. Please refer to the Current Medication list given to you today.   *If you need a refill on your cardiac medications before your next appointment, please call your pharmacy*   Follow-Up: At [facility], you and your health needs are our priority.  As part of our continuing mission to provide you with exceptional heart care, we have created designated Provider Care Teams.  These Care Teams  include your  primary Cardiologist (physician) and Advanced Practice Providers (APPs -  Physician Assistants and Nurse Practitioners) who all work together to provide you with the care you need, when you need it. Your next appointment:   12 month(s) The format for your next appointment:   In Person Provider:   You may see Sherren Mocha, MD or one of the following Advanced Practice Providers on your designated Care Team:    Richardson Dopp, PA-C  Vin [place], PA-C  Daune Perch, [place]    Signed, Sherren Mocha, MD  11/12/2019 4:28 PM    [place]

## 2019-11-12 NOTE — Patient Instructions (Signed)

## 2019-12-04 ENCOUNTER — Encounter: Payer: Self-pay | Admitting: Allergy and Immunology

## 2019-12-04 ENCOUNTER — Ambulatory Visit (INDEPENDENT_AMBULATORY_CARE_PROVIDER_SITE_OTHER): Payer: No Typology Code available for payment source | Admitting: Allergy and Immunology

## 2019-12-04 ENCOUNTER — Other Ambulatory Visit: Payer: Self-pay

## 2019-12-04 DIAGNOSIS — Z880 Allergy status to penicillin: Secondary | ICD-10-CM | POA: Diagnosis not present

## 2019-12-04 DIAGNOSIS — J310 Chronic rhinitis: Secondary | ICD-10-CM | POA: Diagnosis not present

## 2019-12-04 NOTE — Assessment & Plan Note (Signed)
   Continue ipratropium nasal spray as needed as prescribed.  Nasal saline spray (i.e., Simply Saline) or nasal saline lavage (i.e., NeilMed) is recommended as needed and prior to medicated nasal sprays.

## 2019-12-04 NOTE — Progress Notes (Signed)
New Patient Note  RE: Travis Armstrong MRN: [id number] DOB: [birth year] Date of Office Visit: 12/04/2019  Referring provider: Doreene Adas* Primary care provider: Christain Sacramento, MD  Chief Complaint: Other (History of PCN allergy) and Nasal Congestion  History of present illness: Travis Armstrong is a 78 y.o. male seen today in consultation requested by Derrill Center, PA-C. He reports that as a teenager he experienced a sensation of "pins-and-needles" on the bottoms of his feet a day or 2 after receiving a penicillin injection.  He states that this same pruritus/irritation occurred on 2 or 3 occasions after penicillin injections.  He mentioned it to his physician and it was documented in his chart as a penicillin allergy.  He presents today for further evaluation of this issue. He has no other antibiotic allergies. He has no history of recurrent infections. He has no scheduled or planned dental or cardiac procedures in the foreseeable future.   [name] reports that he experiences nasal congestion and/or post nasal drainage.  No significant seasonal symptom variation has been noted nor have specific environmental triggers been identified.  He started ipratropium nasal spray with benefit. He was also prescribed chlorpheniramine which he has not started yet.  Recent labs did not reveal serum specific IgE elevation to common seasonal or perennial aeroallergens.  Assessment and plan: History of penicillin allergy The history suggests low probability for persistence of penicillin allergy. However, I believe the risk of a penicillin challenge currently outweighs the benefit of removing penicillin from his medical allergy list given the patient's age and medical history, including lack of allergy to any other antibiotics, no history of recurrent infections, and no planned dental or cardiac procedure in the foreseeable future.  If patient needs prophylactic antibiotics in lieu of amoxicillin,  consider clindamycin, cephalexin, or cefazolin.  Consider penicillin challenge if the patient's risk to benefit ratio changes to warrant this procedure.  Nonallergic rhinitis  Continue ipratropium nasal spray as needed as prescribed.  Nasal saline spray (i.e., Simply Saline) or nasal saline lavage (i.e., NeilMed) is recommended as needed and prior to medicated nasal sprays.  Diagnostics: None.   Physical examination: Blood pressure (!) 166/62, pulse (!) 53, temperature 98 F (36.7 C), temperature source Temporal, resp. rate 18, height _0  (1.778 m), weight 200 lb 12.8 oz (91.1 kg), SpO2 98 %.  General: Alert, interactive, in no acute distress. HEENT: TMs pearly gray, turbinates mildly edematous without discharge, post-pharynx mildly erythematous. Neck: Supple without lymphadenopathy. Lungs: Clear to auscultation without wheezing, rhonchi or rales. CV: Normal S1, S2 without murmurs. Abdomen: Nondistended, nontender. Skin: Warm and dry, without lesions or rashes. Extremities:  No clubbing, cyanosis or edema. Neuro:   Grossly intact.  Review of systems:  Review of systems negative except as noted in HPI / PMHx or noted below: Review of Systems  Constitutional: Negative.   HENT: Negative.   Eyes: Negative.   Respiratory: Negative.   Cardiovascular: Negative.   Gastrointestinal: Negative.   Genitourinary: Negative.   Musculoskeletal: Negative.   Skin: Negative.   Neurological: Negative.   Endo/Heme/Allergies: Negative.   Psychiatric/Behavioral: Negative.     Past medical history:  Past Medical History:  Diagnosis Date  . Anginal pain (HCC)    has not taken nitro  . Aortic stenosis    a. bicuspid aortic valve;  b.  s/p AVR 04/2014 - 25 mm [facility] Ease bovine pericardial tissue valve  . Arthritis    back  . Bradycardia, sinus   .  CAD (coronary artery disease)    s/p CABG 04/2014 - LIMA to LAD  . History of pneumonia 1958  . History of skin cancer   . HTN  (hypertension)   . Hx of echocardiogram    a. Echo (05/2014):  Mild focal basal septal hypertrophy, EF 55-60%, no RWMA, Gr 1 DD, AVR ok, MAC, mild LAE, mild RVE, mild RAE.  Marland Kitchen Hyperlipidemia   . Keratosis, actinic   . Muscle strain     Past surgical history:  Past Surgical History:  Procedure Laterality Date  . AORTIC VALVE REPLACEMENT N/A 05/03/2014   Procedure: AORTIC VALVE REPLACEMENT (AVR);  Surgeon: Rexene Alberts, MD;  Location: [place];  Service: Open Heart Surgery;  Laterality: N/A;  . CARDIAC CATHETERIZATION N/A 02/25/2016   Procedure: Left Heart Cath and Coronary Angiography;  Surgeon: Larey Dresser, MD;  Location: [place] CV LAB;  Service: Cardiovascular;  Laterality: N/A;  . CORONARY ANGIOPLASTY WITH STENT PLACEMENT  11/23/2011   DES mid LAD  . CORONARY ANGIOPLASTY WITH STENT PLACEMENT  10/02/2012   DES prox LAD  . CORONARY ARTERY BYPASS GRAFT N/A 05/03/2014   Procedure: CORONARY ARTERY BYPASS GRAFTING (CABG);  Surgeon: Rexene Alberts, MD;  Location: [place];  Service: Open Heart Surgery;  Laterality: N/A;  Times 1 using left internal mammary artery.  . INGUINAL HERNIA REPAIR  ~ 2007   left  . INTRAOPERATIVE TRANSESOPHAGEAL ECHOCARDIOGRAM N/A 05/03/2014   Procedure: INTRAOPERATIVE TRANSESOPHAGEAL ECHOCARDIOGRAM;  Surgeon: Rexene Alberts, MD;  Location: [place];  Service: Open Heart Surgery;  Laterality: N/A;  . LEFT HEART CATHETERIZATION WITH CORONARY ANGIOGRAM N/A 04/12/2014   Procedure: LEFT HEART CATHETERIZATION WITH CORONARY ANGIOGRAM;  Surgeon: Larey Dresser, MD;  Location: [facility] CATH LAB;  Service: Cardiovascular;  Laterality: N/A;  . PERCUTANEOUS CORONARY STENT INTERVENTION (PCI-S) N/A 11/23/2011   Procedure: PERCUTANEOUS CORONARY STENT INTERVENTION (PCI-S);  Surgeon: Peter M Martinique, MD;  Location: [facility] CATH LAB;  Service: Cardiovascular;  Laterality: N/A;  . PERCUTANEOUS CORONARY STENT INTERVENTION (PCI-S) N/A 10/02/2012   Procedure: PERCUTANEOUS CORONARY STENT INTERVENTION  (PCI-S);  Surgeon: Hillary Bow, MD;  Location: [facility] CATH LAB;  Service: Cardiovascular;  Laterality: N/A;  . TEE WITHOUT CARDIOVERSION N/A 01/15/2014   Procedure: TRANSESOPHAGEAL ECHOCARDIOGRAM (TEE);  Surgeon: Larey Dresser, MD;  Location: [facility] ENDOSCOPY;  Service: Cardiovascular;  Laterality: N/A;    Family history: Family History  Problem Relation Age of Onset  . Coronary artery disease Brother   . Congestive Heart Failure Mother   . Heart disease Father   . Diabetes Sister   . Stroke Brother   . Lung cancer Sister   . COPD Sister   . Colon cancer Neg Hx   . Esophageal cancer Neg Hx   . Stomach cancer Neg Hx   . Rectal cancer Neg Hx     Social history: Social History   Socioeconomic History  . Marital status: Married    Spouse name: Not on file  . Number of children: Not on file  . Years of education: Not on file  . Highest education level: Not on file  Occupational History  . Not on file  Tobacco Use  . Smoking status: Former Smoker    Packs/day: 1.00    Years: 8.00    Pack years: 8.00    Types: Cigarettes    Quit date: 03/28/1972    Years since quitting: 47.7  . Smokeless tobacco: Never Used  Substance and Sexual Activity  . Alcohol  use: No    Comment: "last alcohol was ~ 1990's"  . Drug use: No  . Sexual activity: Not Currently    Comment: "stopped smoking cigarettes 1973"  Other Topics Concern  . Not on file  Social History Narrative   Lives in [place] with his wife.  Retired.   Social Determinants of Health   Financial Resource Strain:   . Difficulty of Paying Living Expenses: Not on file  Food Insecurity:   . Worried About Charity fundraiser in the Last Year: Not on file  . Ran Out of Food in the Last Year: Not on file  Transportation Needs:   . Lack of Transportation (Medical): Not on file  . Lack of Transportation (Non-Medical): Not on file  Physical Activity:   . Days of Exercise per Week: Not on file  . Minutes of Exercise per Session:  Not on file  Stress:   . Feeling of Stress : Not on file  Social Connections:   . Frequency of Communication with Friends and Family: Not on file  . Frequency of Social Gatherings with Friends and Family: Not on file  . Attends Religious Services: Not on file  . Active Member of Clubs or Organizations: Not on file  . Attends Archivist Meetings: Not on file  . Marital Status: Not on file  Intimate Partner Violence:   . Fear of Current or Ex-Partner: Not on file  . Emotionally Abused: Not on file  . Physically Abused: Not on file  . Sexually Abused: Not on file    Environmental History: Patient lives in a 78 year old house with carpeting throughout, central air, and heat pump.  There is no known mold/water damage in the home.  There are no pets in the home.  He has a former cigarette smoker having quit in 1973 with 15-pack-year history.  Current Outpatient Medications  Medication Sig Dispense Refill  . ACIDOPHILUS LACTOBACILLUS PO Take 1 tablet by mouth 2 (two) times daily.    Marland Kitchen ALPRAZolam (XANAX) 0.5 MG tablet Take 0.5 mg by mouth 3 (three) times daily as needed for anxiety.    Marland Kitchen aspirin 81 MG tablet Take 81 mg by mouth daily.     Marland Kitchen atorvastatin (LIPITOR) 80 MG tablet Take 40 mg by mouth daily.    . calcium carbonate (OS-CAL) 600 MG TABS tablet Take 600 mg by mouth daily with breakfast.    . Carboxymethylcellulose Sod PF 0.25 % SOLN Apply 1 drop to eye 2 (two) times daily. 2 to four times a day    . chlorpheniramine (CHLOR-TRIMETON) 4 MG tablet Take 4 mg by mouth 4 (four) times daily as needed for allergies.    . fluorouracil (EFUDEX) 5 % cream Apply 1 application topically at bedtime.    . gabapentin (NEURONTIN) 300 MG capsule Take 300 mg by mouth 3 (three) times daily.    Marland Kitchen guaiFENesin (ROBITUSSIN) 100 MG/5ML SOLN Take 5 mLs by mouth every 4 (four) hours as needed for cough or to loosen phlegm.    . hydrophilic ointment Apply 1 application topically 2 (two) times daily as  needed for dry skin.    Marland Kitchen ibuprofen (ADVIL) 600 MG tablet Take 600 mg by mouth every 6 (six) hours as needed.    Marland Kitchen ipratropium (ATROVENT) 0.03 % nasal spray Place 1 spray into both nostrils 2 (two) times daily.    Marland Kitchen lidocaine (LIDODERM) 5 % Place 1 patch onto the skin daily. Remove & Discard patch within 12 hours  or as directed by MD    . lisinopril (PRINIVIL,ZESTRIL) 2.5 MG tablet Take 2.5 mg by mouth daily.    . magnesium gluconate (MAGONATE) 500 MG tablet Take 500 mg by mouth daily.     . metoprolol tartrate (LOPRESSOR) 25 MG tablet Take 12.5 mg by mouth 2 (two) times daily.     . Multiple Vitamins-Minerals (PRESERVISION AREDS 2) CAPS Take 2 capsules by mouth daily.    . nitroGLYCERIN (NITROSTAT) 0.4 MG SL tablet Place 1 tablet (0.4 mg total) under the tongue every 5 (five) minutes as needed for chest pain. 90 tablet 3  . Omega-3 Fatty Acids (FISH OIL PO) Take 1 capsule by mouth daily.     Marland Kitchen omeprazole (PRILOSEC) 20 MG capsule Take 20 mg by mouth daily with supper.    Orlie Dakin Sodium 8.6-50 MG CAPS Take 1 tablet by mouth 2 (two) times daily as needed.    Jaclynn Guarneri OP Apply 1 application to eye 2 (two) times daily.    . vitamin B-12 (CYANOCOBALAMIN) 500 MCG tablet Take 1,000 mcg by mouth daily.     No current facility-administered medications for this visit.    Known medication allergies: Allergies  Allergen Reactions  . Ranexa [Ranolazine] Other (See Comments)    dizziness  . Penicillins Rash    Has patient had a PCN reaction causing immediate rash, facial/tongue/throat swelling, SOB or lightheadedness with hypotension: Yes Has patient had a PCN reaction causing severe rash involving mucus membranes or skin necrosis: No Has patient had a PCN reaction that required hospitalization No Has patient had a PCN reaction occurring within the last 10 years: No If all of the above answers are "NO", then may proceed with Cephalosporin use.  ON FEET  . Other Other  (See Comments)    Medication for swollen prostate (unsure of name) - passed out Medication for swollen prostate (unsure of name) - passed out    I appreciate the opportunity to take part in Waldon's care. Please do not hesitate to contact me with questions.  Sincerely,   R. Edgar Frisk, MD

## 2019-12-04 NOTE — Assessment & Plan Note (Addendum)
The history suggests low probability for persistence of penicillin allergy. However, I believe the risk of a penicillin challenge currently outweighs the benefit of removing penicillin from his medical allergy list given the patient's age and medical history, including lack of allergy to any other antibiotics, no history of recurrent infections, and no planned dental or cardiac procedure in the foreseeable future.  If patient needs prophylactic antibiotics in lieu of amoxicillin, consider clindamycin, cephalexin, or cefazolin.  Consider penicillin challenge if the patient's risk to benefit ratio changes to warrant this procedure.

## 2019-12-04 NOTE — Patient Instructions (Signed)
History of penicillin allergy The history suggests low probability for persistence of penicillin allergy. However, I believe the risk of a penicillin challenge currently outweighs the benefit of removing penicillin from his medical allergy list given the patient's age and medical history, including lack of allergy to any other antibiotics, no history of recurrent infections, and no planned dental or cardiac procedure in the foreseeable future.  If patient needs prophylactic antibiotics in lieu of amoxicillin, consider clindamycin, cephalexin, or cefazolin.  Consider penicillin challenge if the patient's risk to benefit ratio changes to warrant this procedure.  Nonallergic rhinitis  Continue ipratropium nasal spray as needed as prescribed.  Nasal saline spray (i.e., Simply Saline) or nasal saline lavage (i.e., NeilMed) is recommended as needed and prior to medicated nasal sprays.   Follow-up if needed.

## 2020-04-08 NOTE — Progress Notes (Signed)
Cardiology Office Note:    Date:  04/09/2020   ID:  Travis Armstrong, DOB 1941/03/21, MRN [id number]  PCP:  Christain Sacramento, MD  Cardiologist:  Sherren Mocha, MD  Electrophysiologist:  None   Referring MD: Christain Sacramento, MD   Chief Complaint:  Chest Pain    Patient Profile:    Travis Armstrong is a 79 y.o. male with:   Coronary artery disease  S/p DES to LAD in 2012  S/p DES to LAD in 2013  S/p CABG+ AVR May 2015  C/b pleural effusion >> thoracentesis   Cath 3/17: LIMA-LAD patent, jailed D2, 90% ostial LCx stenosis (small vessel) - med Rx  Aortic stenosis with bicuspid aortic valve  S/p bioprosthetic AVR in 04/2014  Echo 10/2019: EF 55-60, AVR with normal gradients  Hypertension   Hyperlipidemia   Prior CV studies: Echocardiogram 11/09/2019 EF 55-60, GLS -19, normal RVSF, mod LAE, mild MAC, mild MR, mild TR, bioprosthetic AVR w/ normal gradients (mean 10.5 mmHg), mild dilation of Ao root (38 mm)  Cardiac catheterization 02/25/16 LM irregs LAD mid stents w/ 70% ISR, dist 70; D2 prox to stented segment (jailed) w/ ostial 63 RI irregs LCx (small to mod) ostial 90 RCA irregs L-LAD patent  Med Rx  Myoview 02/04/16 EF 59, inf-lat ischemia; Intermediate Risk  Carotid US 10/15/15 No hemodynamically significant stenosis   History of Present Illness:    Travis Armstrong was last seen in clinic by Dr. Burt Knack in November 2020.  He returns for evaluation of chest discomfort.  He typically has left-sided tightness as his anginal equivalent if he does something more exertional.  This has been stable for years.   He was recently placed on antibiotics for H pylori.  He finished this up early last week.  He was camping later in the week and suddenly had shooting sensation in his left chest for about an hour.  This occurred at rest.  He has not had a recurrence since.  He had no associated symptoms.  He has gone on his regular walk without chest discomfort.  He has not had recurrent anginal  symptoms despite playing golf yesterday.  He has not had orthopnea, lower extremity swelling or syncope.  Past Medical History:  Diagnosis Date  . Anginal pain (HCC)    has not taken nitro  . Aortic stenosis    a. bicuspid aortic valve;  b.  s/p AVR 04/2014 - 25 mm [facility] Ease bovine pericardial tissue valve  . Arthritis    back  . Bradycardia, sinus   . CAD (coronary artery disease)    s/p CABG 04/2014 - LIMA to LAD  . History of pneumonia 1958  . History of skin cancer   . HTN (hypertension)   . Hx of echocardiogram    a. Echo (05/2014):  Mild focal basal septal hypertrophy, EF 55-60%, no RWMA, Gr 1 DD, AVR ok, MAC, mild LAE, mild RVE, mild RAE.  Marland Kitchen Hyperlipidemia   . Keratosis, actinic   . Muscle strain     Current Medications: Current Meds  Medication Sig  . ACIDOPHILUS LACTOBACILLUS PO Take 1 tablet by mouth 2 (two) times daily.  Marland Kitchen ALPRAZolam (XANAX) 0.5 MG tablet Take 0.5 mg by mouth 3 (three) times daily as needed for anxiety.  Marland Kitchen aspirin 81 MG tablet Take 81 mg by mouth daily.   Marland Kitchen atorvastatin (LIPITOR) 80 MG tablet Take 40 mg by mouth daily.  . calcium carbonate (OS-CAL) 600 MG TABS tablet  Take 600 mg by mouth daily with breakfast.  . Carboxymethylcellulose Sod PF 0.25 % SOLN Apply 1 drop to eye 2 (two) times daily. 2 to four times a day  . chlorpheniramine (CHLOR-TRIMETON) 4 MG tablet Take 4 mg by mouth 4 (four) times daily as needed for allergies.  . fluorouracil (EFUDEX) 5 % cream Apply 1 application topically at bedtime.  . gabapentin (NEURONTIN) 300 MG capsule Take 300 mg by mouth 3 (three) times daily.  Marland Kitchen guaiFENesin (ROBITUSSIN) 100 MG/5ML SOLN Take 5 mLs by mouth every 4 (four) hours as needed for cough or to loosen phlegm.  . hydrophilic ointment Apply 1 application topically 2 (two) times daily as needed for dry skin.  Marland Kitchen ibuprofen (ADVIL) 600 MG tablet Take 600 mg by mouth every 6 (six) hours as needed.  Marland Kitchen ipratropium (ATROVENT) 0.03 % nasal spray Place 1  spray into both nostrils 2 (two) times daily.  Marland Kitchen lidocaine (LIDODERM) 5 % Place 1 patch onto the skin daily. Remove & Discard patch within 12 hours or as directed by MD  . lisinopril (PRINIVIL,ZESTRIL) 2.5 MG tablet Take 2.5 mg by mouth daily.  . magnesium gluconate (MAGONATE) 500 MG tablet Take 500 mg by mouth daily.   . metoprolol tartrate (LOPRESSOR) 25 MG tablet Take 12.5 mg by mouth 2 (two) times daily.   . Multiple Vitamins-Minerals (PRESERVISION AREDS 2) CAPS Take 2 capsules by mouth daily.  . nitroGLYCERIN (NITROSTAT) 0.4 MG SL tablet Place 1 tablet (0.4 mg total) under the tongue every 5 (five) minutes as needed for chest pain.  . Omega-3 Fatty Acids (FISH OIL PO) Take 1 capsule by mouth daily.   Marland Kitchen omeprazole (PRILOSEC) 20 MG capsule Take 20 mg by mouth daily with supper.  Orlie Dakin Sodium 8.6-50 MG CAPS Take 1 tablet by mouth 2 (two) times daily as needed.  Jaclynn Guarneri OP Apply 1 application to eye 2 (two) times daily.  . vitamin B-12 (CYANOCOBALAMIN) 500 MCG tablet Take 1,000 mcg by mouth daily.     Allergies:   Ranexa [ranolazine], Penicillins, and Other   Social History   Tobacco Use  . Smoking status: Former Smoker    Packs/day: 1.00    Years: 8.00    Pack years: 8.00    Types: Cigarettes    Quit date: 03/28/1972    Years since quitting: 48.0  . Smokeless tobacco: Never Used  Substance Use Topics  . Alcohol use: No    Comment: "last alcohol was ~ 1990's"  . Drug use: No     Family Hx: The patient's family history includes COPD in his sister; Congestive Heart Failure in his mother; Coronary artery disease in his brother; Diabetes in his sister; Heart disease in his father; Lung cancer in his sister; Stroke in his brother. There is no history of Colon cancer, Esophageal cancer, Stomach cancer, or Rectal cancer.  ROS   EKGs/Labs/Other Test Reviewed:    EKG:  EKG is  ordered today.  The ekg ordered today demonstrates sinus bradycardia, HR 55,  normal axis, right bundle branch block, nonspecific ST-T wave changes, QTC 449, no change since prior tracing  Recent Labs: No results found for requested labs within last 8760 hours.   Recent Lipid Panel Lab Results  Component Value Date/Time   CHOL 89 (L) 08/27/2016 03:29 PM   TRIG 106 08/27/2016 03:29 PM   HDL 43 08/27/2016 03:29 PM   CHOLHDL 2.1 08/27/2016 03:29 PM   [place] 25 08/27/2016 03:29 PM  Physical Exam:    VS:  BP 110/70   Pulse (!) 56   Ht _0  (1.803 m)   Wt 203 lb (92.1 kg)   SpO2 98%   BMI 28.31 kg/m     Wt Readings from Last 3 Encounters:  04/09/20 203 lb (92.1 kg)  12/04/19 200 lb 12.8 oz (91.1 kg)  11/12/19 199 lb 12.8 oz (90.6 kg)     Constitutional:      Appearance: Healthy appearance. Not in distress.  Neck:     Thyroid: No thyromegaly.     Vascular: JVD normal.  Pulmonary:     Breath sounds: No wheezing. No rales.  Cardiovascular:     Normal rate. Regular rhythm. Normal S1. Normal S2.     Murmurs: There is a grade 1/6 early systolic murmur at the URSB.  Edema:    Peripheral edema absent.  Abdominal:     Palpations: Abdomen is soft.     Tenderness: There is no abdominal tenderness.  Skin:    General: Skin is warm and dry.  Neurological:     General: No focal deficit present.     Mental Status: Alert and oriented to person, place and time.      ASSESSMENT & PLAN:    1. Coronary artery disease involving native coronary artery of native heart with angina pectoris ([place]) 2. Other chest pain Prior history of stenting to the LAD in 2012 and 2013 and eventual CABG in 2015.  Catheterization in 2017 demonstrated a patent LIMA-LAD.  He did have severe disease in the ostial LCx which is a small vessel and was managed medically.  Also, his second diagonal is jailed by the previous stent.  He has fairly stable anginal symptoms with overexertion.  He notes, due to back issues, he sometimes has chest pain with walking the golf course.  This is  stable and unchanged.  He recently had some atypical left-sided chest discomfort.  This occurred in the context of recent treatment for H. pylori.  He had no associated symptoms and has not had a recurrence since.  His ECG is unchanged.  He has been able to do his regular walks without chest pain and played golf yesterday without angina. He had an abnormal stress test prior to his cardiac catheterization in 2017.  At this point, I am not certain that a repeat Myoview would be helpful.  He is not having any worsening anginal symptoms.  Therefore, I do not think a repeat cardiac catheterization is needed.  I suspect his symptoms last week were related to his recent diagnosis of H. pylori as well as its treatment.  Continue current medical therapy which includes aspirin, atorvastatin, metoprolol.  I will arrange a 6-week follow-up with Dr. Burt Knack or me.  He knows to contact us if his symptoms should recur or worsen.  3. Aortic valve disorder 4. S/P aortic valve replacement with bioprosthetic valve and CABG x AVR gradients stable by echocardiogram in November 2020.  Continue SBE prophylaxis.  5. Essential hypertension The patient's blood pressure is controlled on his current regimen.  Continue current therapy.     Dispo:  Return in about 6 weeks (around 05/21/2020) for Close Follow Up, w/ Dr. Burt Knack, or Richardson Dopp, PA-C, in person.   Medication Adjustments/Labs and Tests Ordered: Current medicines are reviewed at length with the patient today.  Concerns regarding medicines are outlined above.  Tests Ordered: No orders of the defined types were placed in this encounter.  Medication Changes:  No orders of the defined types were placed in this encounter.   Signed, Richardson Dopp, PA-C  04/09/2020 10:13 AM    [facility] [address], [place], [place]  [address] Phone: [phone]; Fax: [phone]

## 2020-04-09 ENCOUNTER — Other Ambulatory Visit: Payer: Self-pay

## 2020-04-09 ENCOUNTER — Encounter: Payer: Self-pay | Admitting: Physician Assistant

## 2020-04-09 ENCOUNTER — Ambulatory Visit: Payer: PPO | Admitting: Physician Assistant

## 2020-04-09 VITALS — BP 110/70 | HR 56 | Ht 71.0 in | Wt 203.0 lb

## 2020-04-09 DIAGNOSIS — I10 Essential (primary) hypertension: Secondary | ICD-10-CM

## 2020-04-09 DIAGNOSIS — R0789 Other chest pain: Secondary | ICD-10-CM

## 2020-04-09 DIAGNOSIS — Z953 Presence of xenogenic heart valve: Secondary | ICD-10-CM

## 2020-04-09 DIAGNOSIS — I359 Nonrheumatic aortic valve disorder, unspecified: Secondary | ICD-10-CM

## 2020-04-09 DIAGNOSIS — I25119 Atherosclerotic heart disease of native coronary artery with unspecified angina pectoris: Secondary | ICD-10-CM | POA: Diagnosis not present

## 2020-04-09 DIAGNOSIS — E782 Mixed hyperlipidemia: Secondary | ICD-10-CM

## 2020-04-09 NOTE — Patient Instructions (Addendum)
Medication Instructions:  NONE *If you need a refill on your cardiac medications before your next appointment, please call your pharmacy*   Lab Work: NONE If you have labs (blood work) drawn today and your tests are completely normal, you will receive your results only by: Marland Kitchen MyChart Message (if you have MyChart) OR . A paper copy in the mail If you have any lab test that is abnormal or we need to change your treatment, we will call you to review the results.   Testing/Procedures: NONE   Follow-Up: At [facility], you and your health needs are our priority.  As part of our continuing mission to provide you with exceptional heart care, we have created designated Provider Care Teams.  These Care Teams include your primary Cardiologist (physician) and Advanced Practice Providers (APPs -  Physician Assistants and Nurse Practitioners) who all work together to provide you with the care you need, when you need it.  We recommend signing up for the patient portal called "MyChart".  Sign up information is provided on this After Visit Summary.  MyChart is used to connect with patients for Virtual Visits (Telemedicine).  Patients are able to view lab/test results, encounter notes, upcoming appointments, etc.  Non-urgent messages can be sent to your provider as well.   To learn more about what you can do with MyChart, go to [email or web address].    Your next appointment:   6 week  The format for your next appointment:   In PERSON  Provider:   You may see Sherren Mocha, MD or one of the following Advanced Practice Providers on your designated Care Team:    Richardson Dopp, PA-C  [place], [place]  Daune Perch, NP    Other Instructions

## 2020-04-21 NOTE — Addendum Note (Signed)
Addended by: Jacinta Shoe on: 04/21/2020 08:54 AM   Modules accepted: Orders

## 2020-05-01 DIAGNOSIS — I2510 Atherosclerotic heart disease of native coronary artery without angina pectoris: Secondary | ICD-10-CM | POA: Diagnosis not present

## 2020-05-01 DIAGNOSIS — R079 Chest pain, unspecified: Secondary | ICD-10-CM | POA: Diagnosis not present

## 2020-05-01 DIAGNOSIS — I4510 Unspecified right bundle-branch block: Secondary | ICD-10-CM | POA: Diagnosis not present

## 2020-05-01 DIAGNOSIS — G4733 Obstructive sleep apnea (adult) (pediatric): Secondary | ICD-10-CM | POA: Diagnosis not present

## 2020-05-01 DIAGNOSIS — Z951 Presence of aortocoronary bypass graft: Secondary | ICD-10-CM | POA: Diagnosis not present

## 2020-05-01 DIAGNOSIS — I10 Essential (primary) hypertension: Secondary | ICD-10-CM | POA: Diagnosis not present

## 2020-05-01 DIAGNOSIS — R0602 Shortness of breath: Secondary | ICD-10-CM | POA: Diagnosis not present

## 2020-05-01 DIAGNOSIS — Z87891 Personal history of nicotine dependence: Secondary | ICD-10-CM | POA: Diagnosis not present

## 2020-06-11 ENCOUNTER — Ambulatory Visit: Payer: PPO | Admitting: Physician Assistant

## 2020-10-22 ENCOUNTER — Telehealth: Payer: Self-pay | Admitting: Allergy and Immunology

## 2020-10-22 NOTE — Telephone Encounter (Signed)
Left voicemail for patient to call office back regarding a new [facility] referral. Would patient like to renew authorization or close it?

## 2022-06-23 ENCOUNTER — Other Ambulatory Visit: Payer: Self-pay

## 2022-06-23 ENCOUNTER — Encounter (HOSPITAL_BASED_OUTPATIENT_CLINIC_OR_DEPARTMENT_OTHER): Payer: Self-pay | Admitting: Urology

## 2022-06-23 ENCOUNTER — Emergency Department (HOSPITAL_BASED_OUTPATIENT_CLINIC_OR_DEPARTMENT_OTHER): Payer: No Typology Code available for payment source | Admitting: Radiology

## 2022-06-23 DIAGNOSIS — G4733 Obstructive sleep apnea (adult) (pediatric): Secondary | ICD-10-CM | POA: Diagnosis present

## 2022-06-23 DIAGNOSIS — U071 COVID-19: Principal | ICD-10-CM | POA: Diagnosis present

## 2022-06-23 DIAGNOSIS — Z7982 Long term (current) use of aspirin: Secondary | ICD-10-CM

## 2022-06-23 DIAGNOSIS — E871 Hypo-osmolality and hyponatremia: Secondary | ICD-10-CM | POA: Diagnosis not present

## 2022-06-23 DIAGNOSIS — Z888 Allergy status to other drugs, medicaments and biological substances status: Secondary | ICD-10-CM

## 2022-06-23 DIAGNOSIS — E785 Hyperlipidemia, unspecified: Secondary | ICD-10-CM | POA: Diagnosis present

## 2022-06-23 DIAGNOSIS — I2510 Atherosclerotic heart disease of native coronary artery without angina pectoris: Secondary | ICD-10-CM | POA: Diagnosis present

## 2022-06-23 DIAGNOSIS — A0839 Other viral enteritis: Secondary | ICD-10-CM | POA: Diagnosis present

## 2022-06-23 DIAGNOSIS — Z951 Presence of aortocoronary bypass graft: Secondary | ICD-10-CM

## 2022-06-23 DIAGNOSIS — Z79899 Other long term (current) drug therapy: Secondary | ICD-10-CM

## 2022-06-23 DIAGNOSIS — I129 Hypertensive chronic kidney disease with stage 1 through stage 4 chronic kidney disease, or unspecified chronic kidney disease: Secondary | ICD-10-CM | POA: Diagnosis present

## 2022-06-23 DIAGNOSIS — R778 Other specified abnormalities of plasma proteins: Secondary | ICD-10-CM | POA: Diagnosis present

## 2022-06-23 DIAGNOSIS — Z87891 Personal history of nicotine dependence: Secondary | ICD-10-CM

## 2022-06-23 DIAGNOSIS — Z953 Presence of xenogenic heart valve: Secondary | ICD-10-CM

## 2022-06-23 DIAGNOSIS — N1831 Chronic kidney disease, stage 3a: Secondary | ICD-10-CM | POA: Diagnosis present

## 2022-06-23 DIAGNOSIS — Z880 Allergy status to penicillin: Secondary | ICD-10-CM

## 2022-06-23 DIAGNOSIS — Z8249 Family history of ischemic heart disease and other diseases of the circulatory system: Secondary | ICD-10-CM

## 2022-06-23 LAB — CBC WITH DIFFERENTIAL/PLATELET
Abs Immature Granulocytes: 0.03 10*3/uL (ref 0.00–0.07)
Basophils Absolute: 0 10*3/uL (ref 0.0–0.1)
Basophils Relative: 0 %
Eosinophils Absolute: 0 10*3/uL (ref 0.0–0.5)
Eosinophils Relative: 0 %
HCT: 31.4 % — ABNORMAL LOW (ref 39.0–52.0)
Hemoglobin: 11 g/dL — ABNORMAL LOW (ref 13.0–17.0)
Immature Granulocytes: 0 %
Lymphocytes Relative: 10 %
Lymphs Abs: 0.7 10*3/uL (ref 0.7–4.0)
MCH: 32.4 pg (ref 26.0–34.0)
MCHC: 35 g/dL (ref 30.0–36.0)
MCV: 92.6 fL (ref 80.0–100.0)
Monocytes Absolute: 1.1 10*3/uL — ABNORMAL HIGH (ref 0.1–1.0)
Monocytes Relative: 15 %
Neutro Abs: 5.3 10*3/uL (ref 1.7–7.7)
Neutrophils Relative %: 75 %
Platelets: 106 10*3/uL — ABNORMAL LOW (ref 150–400)
RBC: 3.39 MIL/uL — ABNORMAL LOW (ref 4.22–5.81)
RDW: 12.4 % (ref 11.5–15.5)
WBC: 7.2 10*3/uL (ref 4.0–10.5)
nRBC: 0 % (ref 0.0–0.2)

## 2022-06-23 NOTE — ED Triage Notes (Signed)
Positive home COVID test today  Started "feeling bad " Monday  States cough, body aches, and weakness

## 2022-06-24 ENCOUNTER — Other Ambulatory Visit (HOSPITAL_BASED_OUTPATIENT_CLINIC_OR_DEPARTMENT_OTHER): Payer: Self-pay

## 2022-06-24 ENCOUNTER — Encounter (HOSPITAL_BASED_OUTPATIENT_CLINIC_OR_DEPARTMENT_OTHER): Payer: Self-pay | Admitting: Emergency Medicine

## 2022-06-24 ENCOUNTER — Inpatient Hospital Stay (HOSPITAL_BASED_OUTPATIENT_CLINIC_OR_DEPARTMENT_OTHER)
Admission: EM | Admit: 2022-06-24 | Discharge: 2022-06-28 | DRG: 178 | Disposition: A | Discharge status: Home / Self Care | Payer: No Typology Code available for payment source | Attending: Internal Medicine | Admitting: Internal Medicine

## 2022-06-24 DIAGNOSIS — Z8249 Family history of ischemic heart disease and other diseases of the circulatory system: Secondary | ICD-10-CM | POA: Diagnosis not present

## 2022-06-24 DIAGNOSIS — E7800 Pure hypercholesterolemia, unspecified: Secondary | ICD-10-CM | POA: Diagnosis not present

## 2022-06-24 DIAGNOSIS — U071 COVID-19: Secondary | ICD-10-CM | POA: Diagnosis present

## 2022-06-24 DIAGNOSIS — N1831 Chronic kidney disease, stage 3a: Secondary | ICD-10-CM | POA: Diagnosis present

## 2022-06-24 DIAGNOSIS — Q231 Congenital insufficiency of aortic valve: Secondary | ICD-10-CM

## 2022-06-24 DIAGNOSIS — Z87891 Personal history of nicotine dependence: Secondary | ICD-10-CM | POA: Diagnosis not present

## 2022-06-24 DIAGNOSIS — G4733 Obstructive sleep apnea (adult) (pediatric): Secondary | ICD-10-CM | POA: Diagnosis present

## 2022-06-24 DIAGNOSIS — Z953 Presence of xenogenic heart valve: Secondary | ICD-10-CM

## 2022-06-24 DIAGNOSIS — E785 Hyperlipidemia, unspecified: Secondary | ICD-10-CM | POA: Diagnosis present

## 2022-06-24 DIAGNOSIS — Z7982 Long term (current) use of aspirin: Secondary | ICD-10-CM | POA: Diagnosis not present

## 2022-06-24 DIAGNOSIS — Z79899 Other long term (current) drug therapy: Secondary | ICD-10-CM | POA: Diagnosis not present

## 2022-06-24 DIAGNOSIS — Z951 Presence of aortocoronary bypass graft: Secondary | ICD-10-CM | POA: Diagnosis not present

## 2022-06-24 DIAGNOSIS — Z888 Allergy status to other drugs, medicaments and biological substances status: Secondary | ICD-10-CM | POA: Diagnosis not present

## 2022-06-24 DIAGNOSIS — A0839 Other viral enteritis: Secondary | ICD-10-CM | POA: Diagnosis present

## 2022-06-24 DIAGNOSIS — R079 Chest pain, unspecified: Secondary | ICD-10-CM | POA: Diagnosis not present

## 2022-06-24 DIAGNOSIS — Z880 Allergy status to penicillin: Secondary | ICD-10-CM | POA: Diagnosis not present

## 2022-06-24 DIAGNOSIS — I129 Hypertensive chronic kidney disease with stage 1 through stage 4 chronic kidney disease, or unspecified chronic kidney disease: Secondary | ICD-10-CM | POA: Diagnosis present

## 2022-06-24 DIAGNOSIS — I2510 Atherosclerotic heart disease of native coronary artery without angina pectoris: Secondary | ICD-10-CM | POA: Diagnosis present

## 2022-06-24 DIAGNOSIS — R778 Other specified abnormalities of plasma proteins: Secondary | ICD-10-CM | POA: Diagnosis present

## 2022-06-24 DIAGNOSIS — E7841 Elevated Lipoprotein(a): Secondary | ICD-10-CM

## 2022-06-24 DIAGNOSIS — E871 Hypo-osmolality and hyponatremia: Secondary | ICD-10-CM | POA: Diagnosis present

## 2022-06-24 LAB — SODIUM, URINE, RANDOM: Sodium, Ur: 55 mmol/L

## 2022-06-24 LAB — CBC WITH DIFFERENTIAL/PLATELET
Abs Immature Granulocytes: 0.04 10*3/uL (ref 0.00–0.07)
Basophils Absolute: 0 10*3/uL (ref 0.0–0.1)
Basophils Relative: 0 %
Eosinophils Absolute: 0 10*3/uL (ref 0.0–0.5)
Eosinophils Relative: 0 %
HCT: 35.1 % — ABNORMAL LOW (ref 39.0–52.0)
Hemoglobin: 12.1 g/dL — ABNORMAL LOW (ref 13.0–17.0)
Immature Granulocytes: 1 %
Lymphocytes Relative: 20 %
Lymphs Abs: 0.9 10*3/uL (ref 0.7–4.0)
MCH: 33.2 pg (ref 26.0–34.0)
MCHC: 34.5 g/dL (ref 30.0–36.0)
MCV: 96.2 fL (ref 80.0–100.0)
Monocytes Absolute: 1.1 10*3/uL — ABNORMAL HIGH (ref 0.1–1.0)
Monocytes Relative: 24 %
Neutro Abs: 2.5 10*3/uL (ref 1.7–7.7)
Neutrophils Relative %: 55 %
Platelets: 129 10*3/uL — ABNORMAL LOW (ref 150–400)
RBC: 3.65 MIL/uL — ABNORMAL LOW (ref 4.22–5.81)
RDW: 13 % (ref 11.5–15.5)
WBC: 4.6 10*3/uL (ref 4.0–10.5)
nRBC: 0 % (ref 0.0–0.2)

## 2022-06-24 LAB — BASIC METABOLIC PANEL
Anion gap: 7 (ref 5–15)
Anion gap: 7 (ref 5–15)
BUN: 16 mg/dL (ref 8–23)
BUN: 17 mg/dL (ref 8–23)
CO2: 24 mmol/L (ref 22–32)
CO2: 25 mmol/L (ref 22–32)
Calcium: 9.3 mg/dL (ref 8.9–10.3)
Calcium: 9.6 mg/dL (ref 8.9–10.3)
Chloride: 88 mmol/L — ABNORMAL LOW (ref 98–111)
Chloride: 88 mmol/L — ABNORMAL LOW (ref 98–111)
Creatinine, Ser: 1.37 mg/dL — ABNORMAL HIGH (ref 0.61–1.24)
Creatinine, Ser: 1.38 mg/dL — ABNORMAL HIGH (ref 0.61–1.24)
GFR, Estimated: 51 mL/min — ABNORMAL LOW (ref >60)
GFR, Estimated: 52 mL/min — ABNORMAL LOW (ref >60)
Glucose, Bld: 116 mg/dL — ABNORMAL HIGH (ref 70–99)
Glucose, Bld: 118 mg/dL — ABNORMAL HIGH (ref 70–99)
Potassium: 4.4 mmol/L (ref 3.5–5.1)
Potassium: 5 mmol/L (ref 3.5–5.1)
Sodium: 119 mmol/L — CL (ref 135–145)
Sodium: 120 mmol/L — ABNORMAL LOW (ref 135–145)

## 2022-06-24 LAB — COMPREHENSIVE METABOLIC PANEL
ALT: 26 U/L (ref 0–44)
AST: 33 U/L (ref 15–41)
Albumin: 3.7 g/dL (ref 3.5–5.0)
Alkaline Phosphatase: 63 U/L (ref 38–126)
Anion gap: 7 (ref 5–15)
BUN: 16 mg/dL (ref 8–23)
CO2: 25 mmol/L (ref 22–32)
Calcium: 9 mg/dL (ref 8.9–10.3)
Chloride: 98 mmol/L (ref 98–111)
Creatinine, Ser: 1.3 mg/dL — ABNORMAL HIGH (ref 0.61–1.24)
GFR, Estimated: 55 mL/min — ABNORMAL LOW (ref >60)
Glucose, Bld: 96 mg/dL (ref 70–99)
Potassium: 4.1 mmol/L (ref 3.5–5.1)
Sodium: 130 mmol/L — ABNORMAL LOW (ref 135–145)
Total Bilirubin: 0.8 mg/dL (ref 0.3–1.2)
Total Protein: 6.6 g/dL (ref 6.5–8.1)

## 2022-06-24 LAB — LACTATE DEHYDROGENASE: LDH: 185 U/L (ref 98–192)

## 2022-06-24 LAB — TROPONIN I (HIGH SENSITIVITY): Troponin I (High Sensitivity): 33 ng/L — ABNORMAL HIGH (ref <18)

## 2022-06-24 LAB — PROCALCITONIN: Procalcitonin: <0.1 ng/mL

## 2022-06-24 LAB — PHOSPHORUS: Phosphorus: 2.4 mg/dL — ABNORMAL LOW (ref 2.5–4.6)

## 2022-06-24 LAB — FERRITIN: Ferritin: 191 ng/mL (ref 24–336)

## 2022-06-24 LAB — SARS CORONAVIRUS 2 BY RT PCR: SARS Coronavirus 2 by RT PCR: POSITIVE — AB

## 2022-06-24 LAB — OSMOLALITY: Osmolality: 254 mOsm/kg — ABNORMAL LOW (ref 275–295)

## 2022-06-24 LAB — MAGNESIUM: Magnesium: 2 mg/dL (ref 1.7–2.4)

## 2022-06-24 LAB — C-REACTIVE PROTEIN: CRP: 5.2 mg/dL — ABNORMAL HIGH (ref <1.0)

## 2022-06-24 MED ORDER — ONDANSETRON HCL 4 MG/2ML IJ SOLN: 4.0000 mg | Freq: Four times a day (QID) | INTRAMUSCULAR | Status: DC | PRN

## 2022-06-24 MED ORDER — SODIUM CHLORIDE 0.9 % IV SOLN: INTRAVENOUS | Status: DC

## 2022-06-24 MED ORDER — ONDANSETRON HCL 4 MG PO TABS
4.0000 mg | ORAL_TABLET | Freq: Four times a day (QID) | ORAL | Status: DC | PRN
  Administered 2022-06-25: 4 mg via ORAL
  Filled 2022-06-24: qty 1

## 2022-06-24 MED ORDER — ACETAMINOPHEN 325 MG PO TABS
650.0000 mg | ORAL_TABLET | Freq: Four times a day (QID) | ORAL | Status: DC | PRN
  Administered 2022-06-25: 650 mg via ORAL
  Filled 2022-06-24: qty 2

## 2022-06-24 MED ORDER — SODIUM CHLORIDE 0.9% FLUSH
3.0000 mL | Freq: Two times a day (BID) | INTRAVENOUS | Status: DC
  Administered 2022-06-25 – 2022-06-27 (×4): 3 mL via INTRAVENOUS

## 2022-06-24 MED ORDER — MOLNUPIRAVIR EUA 200MG CAPSULE
4.0000 | ORAL_CAPSULE | Freq: Two times a day (BID) | ORAL | Status: DC
  Administered 2022-06-24 – 2022-06-28 (×9): 800 mg via ORAL
  Filled 2022-06-24 (×2): qty 4

## 2022-06-24 MED ORDER — HYDROCODONE-ACETAMINOPHEN 5-325 MG PO TABS: 1.0000 | ORAL_TABLET | ORAL | Status: DC | PRN

## 2022-06-24 MED ORDER — GUAIFENESIN-DM 100-10 MG/5ML PO SYRP: 10.0000 mL | ORAL_SOLUTION | ORAL | Status: DC | PRN

## 2022-06-24 NOTE — Subjective & Objective (Signed)
Have been feeling weak Found to have Covid and sodium 120  Started on IV Fluids CXR consistent with covid

## 2022-06-24 NOTE — ED Notes (Signed)
Care Handoff/report given to [facility] at this Time. All Questions Answered.

## 2022-06-24 NOTE — ED Notes (Signed)
Patient provided urinal to void.

## 2022-06-24 NOTE — H&P (Signed)
Travis Armstrong DOB: 07-Nov-1941 DOA: 06/24/2022     PCP: Christain Sacramento, MD  goes to [facility] Out[facility] Specialists:   CARDS:  Dr. Burt Knack    Patient arrived to ER on 06/23/22 at 2300 Referred by Attending Palumbo, April, MD   Patient coming from:    home Lives alone,     Chief Complaint:   Chief Complaint  Patient presents with   Weakness    HPI: Travis Armstrong is a 81 y.o. male with medical history significant of CAD, aortic valve disease, HTN ,HLD    Presented with  generalized fatigue Have been feeling weak Found to have Covid and sodium 120  Started on IV Fluids CXR consistent with covid  He was at the beach and started to feel very bad he went to [place] and was diagnosed with COVID no hx of Hyponatremia  A bit of cough Denies any fever Reports CP with cough  He has been drinking lots of water at home No ETOH no toabcco    Initial COVID TEST    POSITIVE,     Lab Results  Component Value Date   [place] (A) 06/23/2022     Regarding pertinent Chronic problems:     Hyperlipidemia - on statins Lipitor (atorvastatin)  Lipid Panel     Component Value Date/Time   CHOL 89 (L) 08/27/2016 1529   TRIG 106 08/27/2016 1529   HDL 43 08/27/2016 1529   CHOLHDL 2.1 08/27/2016 1529   VLDL 21 08/27/2016 1529   [place] 25 08/27/2016 1529     HTN on Imdur, lisinopril, metoprolol, [place]     OSA -on nocturnal  CPAP,   CKD stage IIIa- baseline Cr  1.3 Estimated Creatinine Clearance: 49.6 mL/min (A) (by C-G formula based on SCr of 1.37 mg/dL (H)).  Lab Results  Component Value Date   CREATININE 1.37 (H) 06/24/2022   CREATININE 1.38 (H) 06/23/2022   CREATININE 1.26 (H) 08/27/2016    While in ER:   Covid positive started Lagevrio    CXR - Right basilar opacities, likely atelectasis.    Following Medications were ordered in ER: Medications  molnupiravir EUA (LAGEVRIO) capsule 800 mg (800 mg Oral Given 06/24/22 1254)       ED Triage  Vitals  Enc Vitals Group     BP 06/23/22 2330 137/65     Pulse Rate 06/23/22 2330 65     Resp 06/23/22 2330 20     Temp 06/23/22 2330 98.4 F (36.9 C)     Temp Source 06/23/22 2330 Oral     SpO2 06/23/22 2330 100 %     Weight 06/23/22 2326 210 lb (95.3 kg)     Height 06/23/22 2326 _0  (1.803 m)     Head Circumference --      Peak Flow --      Pain Score 06/23/22 2326 5     Pain Loc --      Pain Edu? --      Excl. in [place]? --   TMAX(24)@     _________________________________________ Significant initial  Findings: Abnormal Labs Reviewed  SARS CORONAVIRUS 2 BY RT PCR - Abnormal; Notable for the following components:      Result Value   SARS Coronavirus 2 by RT PCR POSITIVE (*)    All other components within normal limits  CBC WITH DIFFERENTIAL/PLATELET - Abnormal; Notable for the following components:   RBC 3.39 (*)    Hemoglobin 11.0 (*)  HCT 31.4 (*)    Platelets 106 (*)    Monocytes Absolute 1.1 (*)    All other components within normal limits  BASIC METABOLIC PANEL - Abnormal; Notable for the following components:   Sodium 119 (*)    Chloride 88 (*)    Glucose, Bld 118 (*)    Creatinine, Ser 1.38 (*)    GFR, Estimated 51 (*)    All other components within normal limits  BASIC METABOLIC PANEL - Abnormal; Notable for the following components:   Sodium 120 (*)    Chloride 88 (*)    Glucose, Bld 116 (*)    Creatinine, Ser 1.37 (*)    GFR, Estimated 52 (*)    All other components within normal limits  OSMOLALITY - Abnormal; Notable for the following components:   Osmolality 254 (*)    All other components within normal limits     _________________________ Troponin 33  ECG: Ordered     The recent clinical data is shown below. Vitals:   06/24/22 1700 06/24/22 1715 06/24/22 1730 06/24/22 1832  BP: (!) 155/70  (!) 154/70 (!) 158/70  Pulse: 65 63 61 64  Resp: _0 Temp:    98.3 F (36.8 C)  TempSrc:    Oral  SpO2:  96% 93% 98%  Weight:    94.5 kg   Height:    _1  (1.803 m)    WBC     Component Value Date/Time   WBC 7.2 06/23/2022 2335   LYMPHSABS 0.7 06/23/2022 2335   MONOABS 1.1 (H) 06/23/2022 2335   EOSABS 0.0 06/23/2022 2335   BASOSABS 0.0 06/23/2022 2335     Procalcitonin <0.01   UA ordered     Results for orders placed or performed during the hospital encounter of 06/24/22  SARS Coronavirus 2 by RT PCR (hospital order, performed in [place] hospital lab) *cepheid single result test* Anterior Nasal Swab     Status: Abnormal   Collection Time: 06/23/22 11:08 PM   Specimen: Anterior Nasal Swab  Result Value Ref Range Status   SARS Coronavirus 2 by RT PCR POSITIVE (A) NEGATIVE Final          _______________________________________________ Hospitalist was called for admission for   COVID-19   Hyponatremia     The following Work up has been ordered so far:  Orders Placed This Encounter  Procedures   SARS Coronavirus 2 by RT PCR (hospital order, performed in [place] hospital lab) *cepheid single result test* Anterior Nasal Swab   DG Chest 2 View   CBC with Differential   Basic metabolic panel   Basic metabolic panel   Osmolality   Sodium, urine, random   Diet Heart Room service appropriate? Yes; Fluid consistency: Thin   Vital signs   Consult to hospitalist   Airborne and Contact precautions   Admit to Inpatient (patient's expected length of stay will be greater than 2 midnights or inpatient only procedure)     OTHER Significant initial  Findings:  labs showing:    Recent Labs  Lab 06/23/22 2335 06/24/22 0121  NA 119* 120*  K 4.4 5.0  CO2 24 25  GLUCOSE 118* 116*  BUN 17 16  CREATININE 1.38* 1.37*  CALCIUM 9.3 9.6    Cr   stable,    Lab Results  Component Value Date   CREATININE 1.37 (H) 06/24/2022   CREATININE 1.38 (H) 06/23/2022   CREATININE 1.26 (H) 08/27/2016    Recent Labs  Lab 06/24/22 2104  AST 33  ALT 26  ALKPHOS 63  BILITOT 0.8  PROT 6.6  ALBUMIN 3.7   Lab  Results  Component Value Date   CALCIUM 9.6 06/24/2022        Plt: Lab Results  Component Value Date   PLT 106 (L) 06/23/2022       COVID-19 Labs  Recent Labs    06/24/22 2104  FERRITIN 191  LDH 185  CRP 5.2*    Lab Results  Component Value Date   SARSCOV2NAA POSITIVE (A) 06/23/2022       Recent Labs  Lab 06/23/22 2335  WBC 7.2  NEUTROABS 5.3  HGB 11.0*  HCT 31.4*  MCV 92.6  PLT 106*    HG/HCT  stable,      Component Value Date/Time   HGB 11.0 (L) 06/23/2022 2335   HCT 31.4 (L) 06/23/2022 2335   MCV 92.6 06/23/2022 2335           Cultures: No results found for: "SDES", "SPECREQUEST", "CULT", "REPTSTATUS"   Radiological Exams on Admission: DG Chest 2 View  Result Date: 06/23/2022 CLINICAL DATA:  Cough EXAM: CHEST - 2 VIEW COMPARISON:  06/03/2014 FINDINGS: Right basilar linear opacity. Remote median sternotomy and valvuloplasty. No pleural effusion or pneumothorax. IMPRESSION: Right basilar opacities, likely atelectasis. Electronically Signed   By: Ulyses Jarred M.D.   On: 06/23/2022 23:51   _______________________________________________________________________________________________________ Latest  Blood pressure (!) 158/70, pulse 64, temperature 98.3 F (36.8 C), temperature source Oral, resp. rate 18, height _0  (1.803 m), weight 94.5 kg, SpO2 98 %.   Vitals  labs and radiology finding personally reviewed  Review of Systems:    Pertinent positives include:   fatigue, shortness of breath at rest.  Constitutional:  No weight loss, night sweats, Fevers, chills, weight loss  HEENT:  No headaches, Difficulty swallowing,Tooth/dental problems,Sore throat,  No sneezing, itching, ear ache, nasal congestion, post nasal drip,  Cardio-vascular:  No chest pain, Orthopnea, PND, anasarca, dizziness, palpitations.no Bilateral lower extremity swelling  GI:  No heartburn, indigestion, abdominal pain, nausea, vomiting, diarrhea, change in bowel habits, loss of  appetite, melena, blood in stool, hematemesis Resp:  no  No dyspnea on exertion, No excess mucus, no productive cough, No non-productive cough, No coughing up of blood.No change in color of mucus.No wheezing. Skin:  no rash or lesions. No jaundice GU:  no dysuria, change in color of urine, no urgency or frequency. No straining to urinate.  No flank pain.  Musculoskeletal:  No joint pain or no joint swelling. No decreased range of motion. No back pain.  Psych:  No change in mood or affect. No depression or anxiety. No memory loss.  Neuro: no localizing neurological complaints, no tingling, no weakness, no double vision, no gait abnormality, no slurred speech, no confusion  All systems reviewed and apart from [place] all are negative _______________________________________________________________________________________________ Past Medical History:   Past Medical History:  Diagnosis Date   Anginal pain ([place])    has not taken nitro   Aortic stenosis    a. bicuspid aortic valve;  b.  s/p AVR 04/2014 - 25 mm [facility] Ease bovine pericardial tissue valve   Arthritis    back   Bradycardia, sinus    CAD (coronary artery disease)    s/p CABG 04/2014 - [place] to LAD   History of pneumonia 1958   History of skin cancer    HTN (hypertension)    Hx of echocardiogram    a.  Echo (05/2014):  Mild focal basal septal hypertrophy, EF 55-60%, no RWMA, Gr 1 DD, AVR ok, MAC, mild LAE, mild RVE, mild RAE.   Hyperlipidemia    Keratosis, actinic    Muscle strain      Past Surgical History:  Procedure Laterality Date   AORTIC VALVE REPLACEMENT N/A 05/03/2014   Procedure: AORTIC VALVE REPLACEMENT (AVR);  Surgeon: Rexene Alberts, MD;  Location: [place];  Service: Open Heart Surgery;  Laterality: N/A;   CARDIAC CATHETERIZATION N/A 02/25/2016   Procedure: Left Heart Cath and Coronary Angiography;  Surgeon: Larey Dresser, MD;  Location: [place] CV LAB;  Service: Cardiovascular;  Laterality: N/A;    CORONARY ANGIOPLASTY WITH STENT PLACEMENT  11/23/2011   DES mid LAD   CORONARY ANGIOPLASTY WITH STENT PLACEMENT  10/02/2012   DES prox LAD   CORONARY ARTERY BYPASS GRAFT N/A 05/03/2014   Procedure: CORONARY ARTERY BYPASS GRAFTING (CABG);  Surgeon: Rexene Alberts, MD;  Location: [place];  Service: Open Heart Surgery;  Laterality: N/A;  Times 1 using left internal mammary artery.   EYE SURGERY     INGUINAL HERNIA REPAIR  ~ 2007   left   INTRAOPERATIVE TRANSESOPHAGEAL ECHOCARDIOGRAM N/A 05/03/2014   Procedure: INTRAOPERATIVE TRANSESOPHAGEAL ECHOCARDIOGRAM;  Surgeon: Rexene Alberts, MD;  Location: [place];  Service: Open Heart Surgery;  Laterality: N/A;   LEFT HEART CATHETERIZATION WITH CORONARY ANGIOGRAM N/A 04/12/2014   Procedure: LEFT HEART CATHETERIZATION WITH CORONARY ANGIOGRAM;  Surgeon: Larey Dresser, MD;  [facility] C[facility] LAB;  Service: Cardiovascular;  Laterality: N/A;   PERCUTANEOUS CORONARY STENT INTERVENTION (PCI-S) N/A 11/23/2011   Procedure: PERCUTANEOUS CORONARY STENT INTERVENTION (PCI-S);  Surgeon: Peter M Martinique, MD;  Location: [facility] CATH LAB;  Service: Cardiovascular;  Laterality: N/A;   PERCUTANEOUS CORONARY STENT INTERVENTION (PCI-S) N/A 10/02/2012   Procedure: PERCUTANEOUS CORONARY STENT INTERVENTION (PCI-S);  Surgeon: Hillary Bow, MD;  Location: [facility] CATH LAB;  Service: Cardiovascular;  Laterality: N/A;   TEE WITHOUT CARDIOVERSION N/A 01/15/2014   Procedure: TRANSESOPHAGEAL ECHOCARDIOGRAM (TEE);  Surgeon: Larey Dresser, MD;  Location: [place];  Service: Cardiovascular;  Laterality: N/A;    Social History:  Ambulatory   independently       reports that he quit smoking about 50 years ago. His smoking use included cigarettes. He has a 8.00 pack-year smoking history. He has never used smokeless tobacco. He reports that he does not drink alcohol and does not use drugs.     Family History:   Family History  Problem Relation Age of Onset   Coronary artery disease  Brother    Congestive Heart Failure Mother    Heart disease Father    Diabetes Sister    Stroke Brother    Lung cancer Sister    COPD Sister    Colon cancer Neg Hx    Esophageal cancer Neg Hx    Stomach cancer Neg Hx    Rectal cancer Neg Hx    ______________________________________________________________________________________________ Allergies: Allergies  Allergen Reactions   Ranexa [Ranolazine] Other (See Comments)    dizziness   Penicillins Rash    Has patient had a PCN reaction causing immediate rash, facial/tongue/throat swelling, SOB or lightheadedness with hypotension: Yes Has patient had a PCN reaction causing severe rash involving mucus membranes or skin necrosis: No Has patient had a PCN reaction that required hospitalization No Has patient had a PCN reaction occurring within the last 10 years: No If all of the above answers are "NO", then may  proceed with Cephalosporin use.  ON FEET   Other Other (See Comments)    Medication for swollen prostate (unsure of name) - passed out Medication for swollen prostate (unsure of name) - passed out     Prior to Admission medications   Medication Sig Start Date End Date Taking? Authorizing Provider  ACIDOPHILUS LACTOBACILLUS PO Take 1 tablet by mouth 2 (two) times daily.    [provider]  ALPRAZolam Duanne Moron) 0.5 MG tablet Take 0.5 mg by mouth 3 (three) times daily as needed for anxiety.    [provider]  aspirin 81 MG tablet Take 81 mg by mouth daily.     [provider]  atorvastatin (LIPITOR) 80 MG tablet Take 40 mg by mouth daily.    [provider]  calcium carbonate (OS-CAL) 600 MG TABS tablet Take 600 mg by mouth daily with breakfast.    [provider]  Carboxymethylcellulose Sod PF 0.25 % SOLN Apply 1 drop to eye 2 (two) times daily. 2 to four times a day    [provider]  chlorpheniramine (CHLOR-TRIMETON) 4 MG tablet Take 4 mg by mouth 4 (four) times daily as  needed for allergies.    [provider]  fluorouracil (EFUDEX) 5 % cream Apply 1 application topically at bedtime.    [provider]  gabapentin (NEURONTIN) 300 MG capsule Take 300 mg by mouth 3 (three) times daily.    [provider]  guaiFENesin (ROBITUSSIN) 100 MG/5ML SOLN Take 5 mLs by mouth every 4 (four) hours as needed for cough or to loosen phlegm.    [provider]  hydrophilic ointment Apply 1 application topically 2 (two) times daily as needed for dry skin.    [provider]  ibuprofen (ADVIL) 600 MG tablet Take 600 mg by mouth every 6 (six) hours as needed.    [provider]  ipratropium (ATROVENT) 0.03 % nasal spray Place 1 spray into both nostrils 2 (two) times daily.    [provider]  lidocaine (LIDODERM) 5 % Place 1 patch onto the skin daily. Remove & Discard patch within 12 hours or as directed by MD    [provider]  lisinopril (PRINIVIL,ZESTRIL) 2.5 MG tablet Take 2.5 mg by mouth daily.    [provider]  magnesium gluconate (MAGONATE) 500 MG tablet Take 500 mg by mouth daily.     [provider]  metoprolol tartrate (LOPRESSOR) 25 MG tablet Take 12.5 mg by mouth 2 (two) times daily.     [provider]  Multiple Vitamins-Minerals (PRESERVISION AREDS 2) CAPS Take 2 capsules by mouth daily.    [provider]  nitroGLYCERIN (NITROSTAT) 0.4 MG SL tablet Place 1 tablet (0.4 mg total) under the tongue every 5 (five) minutes as needed for chest pain. 09/23/14   Larey Dresser, MD  Omega-3 Fatty Acids (FISH OIL PO) Take 1 capsule by mouth daily.     [provider]  omeprazole (PRILOSEC) 20 MG capsule Take 20 mg by mouth daily with supper.    [provider]  Sennosides-Docusate Sodium 8.6-50 MG CAPS Take 1 tablet by mouth 2 (two) times daily as needed.    [provider]  TOBRAMYCIN-DEXAMETHASONE OP Apply 1 application to eye 2 (two) times  daily.    [provider]  vitamin B-12 (CYANOCOBALAMIN) 500 MCG tablet Take 1,000 mcg by mouth daily.    [provider]    ___________________________________________________________________________________________________ Physical Exam:    06/24/2022  6:32 PM 06/24/2022    5:30 PM 06/24/2022    5:15 PM  Vitals with BMI  Height _0     Weight 208 lbs 5 oz    BMI 88.50    Systolic 277 412   Diastolic 70 70   Pulse 64 61 63     1. General:  in No  Acute distress   well   -appearing 2. Psychological: Alert and   Oriented 3. Head/ENT:    Dry Mucous Membranes                          Head Non traumatic, neck supple                        Poor Dentition 4. SKIN:  decreased Skin turgor,  Skin clean Dry and intact no rash 5. Heart: Regular rate and rhythm systolic  Murmur, no Rub or gallop 6. Lungs:  Clear to auscultation bilaterally,  7. Abdomen: Soft,  non-tender, Non distended  bowel sounds present 8. Lower extremities: no clubbing, cyanosis, no  edema 9. Neurologically Grossly intact, moving all 4 extremities equally   10. MSK: Normal range of motion    Chart has been reviewed  ______________________________________________________________________________________________  Assessment/Plan  81 y.o. male with medical history significant of CAD, aortic valve disease, HTN ,HLD   Admitted for   COVID-19   Hyponatremia     Present on Admission:  COVID-19 virus infection  CAD (coronary artery disease)  Hyperlipidemia  Hyponatremia  OSA (obstructive sleep apnea)  Elevated troponin     Bicuspid aortic valve Chronic sp post replacement  CAD (coronary artery disease) Chronic stable continue aspirin 81 mg daily Lipitor 10 mg daily  COVID-19 virus infection Given cough advanced age will start on Lagevrio Chest x-ray more consistent with atelectasis No hypoxia Obtain inflammatory markers  Order airborne precautions  Hyperlipidemia  continue  Lipitor 10 mg a day  S/P aortic valve replacement with bioprosthetic valve and CABG x Chronic stable followed by cardiology  Hyponatremia Sodium now up to 130.  Continue to monitor repeat in 6 hours or so Slowed IV fluids to 75 mils an hour to avoid rapid correction Obtain urine electrolytes check TSH  OSA (obstructive sleep apnea) Ordered CPAP  Elevated troponin Continue to follow No chest pain at this time Obtain ecg  Obtain echogram in a.m.   Other plan as per orders.  DVT prophylaxis:  SCD      Code Status:    Code Status: Prior FULL CODE    as per patient    I had personally discussed CODE STATUS with patient    Family Communication:   Family not at  Bedside    Disposition Plan:       To home once workup is complete and patient is stable   Following barriers for discharge:                            Electrolytes corrected                                                  Consults called: none  Admission status:  ED Disposition     ED Disposition  Admit   Condition  --  Comment  Hospital Area: [facility] [[id number]]  Level of Care: Med-Surg [[id number]]  May [facility] patient to [facility] or [facility] if equivalent level of care is available:: Yes  Interfacility transfer: Yes  Covid Evaluation: Confirmed COVID Positive  Diagnosis: COVID-19 virus infection [[id number]]  Admitting Physician: Shela Leff [[id number]]  Attending Physician: Veatrice Kells [[id number]]  Estimated length of stay: past midnight tomorrow  Certification:: I certify this patient will need inpatient services for at least 2 midnights          inpatient     I Expect 2 midnight stay secondary to severity of patient's current illness need for inpatient interventions justified by the following:    Severe lab/radiological/exam abnormalities including:    hyponatremia and extensive comorbidities including: CAD  CKD   That are currently affecting medical management.   I  expect  patient to be hospitalized for 2 midnights requiring inpatient medical care.  Patient is at high risk for adverse outcome (such as loss of life or disability) if not treated.  Indication for inpatient stay as follows:    Need for frequent labs  Need for  IV fluids,     Level of care      telemetry      Lab Results  Component Value Date   [place] (A) 06/23/2022     Precautions: admitted as covid positive Airborne and Contact precautions    Maximiano Lott 06/25/2022, 2:02 AM    [facility]     after 2 AM please page floor coverage PA If 7AM-7PM, please contact the day team taking care of the patient using [email or web address]   Patient was evaluated in the context of the global COVID-19 pandemic, which necessitated consideration that the patient might be at risk for infection with the SARS-CoV-2 virus that causes COVID-19. Institutional protocols and algorithms that pertain to the evaluation of patients at risk for COVID-19 are in a state of rapid change based on information released by regulatory bodies including the CDC and federal and state organizations. These policies and algorithms were followed during the patient's care.

## 2022-06-24 NOTE — ED Provider Notes (Signed)
  Physical Exam  BP 129/88   Pulse 64   Temp 98.4 F (36.9 C) (Oral)   Resp 16   Ht 1.803 m ('5\' 11"'$ )   Wt 95.3 kg   SpO2 97%   BMI 29.29 kg/m   Physical Exam  Procedures  Procedures  ED Course / MDM    Medical Decision Making Amount and/or Complexity of Data Reviewed Labs: ordered. Radiology: ordered.  Risk Prescription drug management. Decision regarding hospitalization.   81 year old male presented last night with generalized malaise.  Patient found to be COVID-positive and have sodium of 120.  Patient with normal saline running Patient is hemodynamically stable this morning with normal heart rate, blood pressure, oxygen saturations. Patient is pending admission   Pattricia Boss, MD 06/24/22 [phone]

## 2022-06-24 NOTE — ED Notes (Signed)
Patient made aware of Inpatient Bed Status. Verbally Consents to Transfer.

## 2022-06-24 NOTE — ED Provider Notes (Signed)
[facility] DEPT Provider Note   CSN: [id number] Arrival date & time: 06/23/22  2300     History  Chief Complaint  Patient presents with   Covid Positive    Travis Armstrong is a 81 y.o. male.  The history is provided by the patient and a relative.  Illness Location:  Body Quality:  Aching and feeling globally weake with a cough Severity:  Moderate Onset quality:  Gradual Duration:  4 days Timing:  Constant Progression:  Unchanged Chronicity:  New Context:  Elderly Relieved by:  Nothing Worsened by:  Time Associated symptoms: no abdominal pain, no chest pain, no congestion, no cough, no diarrhea, no ear pain, no fever, no headaches and no loss of consciousness        Home Medications Prior to Admission medications   Medication Sig Start Date End Date Taking? Authorizing Provider  ACIDOPHILUS LACTOBACILLUS PO Take 1 tablet by mouth 2 (two) times daily.    [provider]  ALPRAZolam Duanne Moron) 0.5 MG tablet Take 0.5 mg by mouth 3 (three) times daily as needed for anxiety.    [provider]  aspirin 81 MG tablet Take 81 mg by mouth daily.     [provider]  atorvastatin (LIPITOR) 80 MG tablet Take 40 mg by mouth daily.    [provider]  calcium carbonate (OS-CAL) 600 MG TABS tablet Take 600 mg by mouth daily with breakfast.    [provider]  Carboxymethylcellulose Sod PF 0.25 % SOLN Apply 1 drop to eye 2 (two) times daily. 2 to four times a day    [provider]  chlorpheniramine (CHLOR-TRIMETON) 4 MG tablet Take 4 mg by mouth 4 (four) times daily as needed for allergies.    [provider]  fluorouracil (EFUDEX) 5 % cream Apply 1 application topically at bedtime.    [provider]  gabapentin (NEURONTIN) 300 MG capsule Take 300 mg by mouth 3 (three) times daily.    [provider]  guaiFENesin (ROBITUSSIN) 100 MG/5ML SOLN Take 5 mLs by mouth every 4 (four) hours as  needed for cough or to loosen phlegm.    [provider]  hydrophilic ointment Apply 1 application topically 2 (two) times daily as needed for dry skin.    [provider]  ibuprofen (ADVIL) 600 MG tablet Take 600 mg by mouth every 6 (six) hours as needed.    [provider]  ipratropium (ATROVENT) 0.03 % nasal spray Place 1 spray into both nostrils 2 (two) times daily.    [provider]  lidocaine (LIDODERM) 5 % Place 1 patch onto the skin daily. Remove & Discard patch within 12 hours or as directed by MD    [provider]  lisinopril (PRINIVIL,ZESTRIL) 2.5 MG tablet Take 2.5 mg by mouth daily.    [provider]  magnesium gluconate (MAGONATE) 500 MG tablet Take 500 mg by mouth daily.     [provider]  metoprolol tartrate (LOPRESSOR) 25 MG tablet Take 12.5 mg by mouth 2 (two) times daily.     [provider]  Multiple Vitamins-Minerals (PRESERVISION AREDS 2) CAPS Take 2 capsules by mouth daily.    [provider]  nitroGLYCERIN (NITROSTAT) 0.4 MG SL tablet Place 1 tablet (0.4 mg total) under the tongue every 5 (five) minutes as needed for chest pain. 09/23/14   Larey Dresser, MD  Omega-3 Fatty Acids (FISH OIL PO) Take 1 capsule by mouth daily.  [provider]  omeprazole (PRILOSEC) 20 MG capsule Take 20 mg by mouth daily with supper.    [provider]  Sennosides-Docusate Sodium 8.6-50 MG CAPS Take 1 tablet by mouth 2 (two) times daily as needed.    [provider]  TOBRAMYCIN-DEXAMETHASONE OP Apply 1 application to eye 2 (two) times daily.    [provider]  vitamin B-12 (CYANOCOBALAMIN) 500 MCG tablet Take 1,000 mcg by mouth daily.    [provider]      Allergies    Ranexa [ranolazine], Penicillins, and Other    Review of Systems   Review of Systems  Constitutional:  Negative for fever.  HENT:  Negative for congestion and ear pain.   Respiratory:   Negative for cough.   Cardiovascular:  Negative for chest pain.  Gastrointestinal:  Negative for abdominal pain and diarrhea.  Neurological:  Negative for loss of consciousness and headaches.  All other systems reviewed and are negative.   Physical Exam Updated Vital Signs BP 138/75   Pulse 63   Temp 98.4 F (36.9 C) (Oral)   Resp 17   Ht '5\' 11"'$  (1.803 m)   Wt 95.3 kg   SpO2 95%   BMI 29.29 kg/m  Physical Exam Vitals and nursing note reviewed.  Constitutional:      General: He is not in acute distress.    Appearance: He is well-developed. He is not diaphoretic.  HENT:     Head: Normocephalic and atraumatic.     Nose: Nose normal.  Eyes:     Conjunctiva/sclera: Conjunctivae normal.     Pupils: Pupils are equal, round, and reactive to light.  Cardiovascular:     Rate and Rhythm: Normal rate and regular rhythm.  Pulmonary:     Effort: Pulmonary effort is normal.     Breath sounds: Normal breath sounds. No wheezing or rales.  Abdominal:     General: Bowel sounds are normal.     Palpations: Abdomen is soft.     Tenderness: There is no abdominal tenderness. There is no guarding or rebound.  Musculoskeletal:        General: Normal range of motion.     Cervical back: Normal range of motion and neck supple.  Skin:    General: Skin is warm and dry.     Capillary Refill: Capillary refill takes less than 2 seconds.  Neurological:     General: No focal deficit present.     Mental Status: He is alert and oriented to person, place, and time.     Deep Tendon Reflexes: Reflexes normal.  Psychiatric:        Mood and Affect: Mood normal.     ED Results / Procedures / Treatments   Labs (all labs ordered are listed, but only abnormal results are displayed) Labs Reviewed  SARS CORONAVIRUS 2 BY RT PCR - Abnormal; Notable for the following components:      Result Value   SARS Coronavirus 2 by RT PCR POSITIVE (*)    All other components within normal limits  CBC WITH  DIFFERENTIAL/PLATELET - Abnormal; Notable for the following components:   RBC 3.39 (*)    Hemoglobin 11.0 (*)    HCT 31.4 (*)    Platelets 106 (*)    Monocytes Absolute 1.1 (*)    All other components within normal limits  BASIC METABOLIC PANEL - Abnormal; Notable for the following components:   Sodium 119 (*)    Chloride 88 (*)  Glucose, Bld 118 (*)    Creatinine, Ser 1.38 (*)    GFR, Estimated 51 (*)    All other components within normal limits  BASIC METABOLIC PANEL - Abnormal; Notable for the following components:   Sodium 120 (*)    Chloride 88 (*)    Glucose, Bld 116 (*)    Creatinine, Ser 1.37 (*)    GFR, Estimated 52 (*)    All other components within normal limits  OSMOLALITY  SODIUM, URINE, RANDOM    EKG None  Radiology DG Chest 2 View  Result Date: 06/23/2022 CLINICAL DATA:  Cough EXAM: CHEST - 2 VIEW COMPARISON:  06/03/2014 FINDINGS: Right basilar linear opacity. Remote median sternotomy and valvuloplasty. No pleural effusion or pneumothorax. IMPRESSION: Right basilar opacities, likely atelectasis. Electronically Signed   By: Ulyses Jarred M.D.   On: 06/23/2022 23:51    Procedures Procedures    Medications Ordered in ED Medications  0.9 %  sodium chloride infusion ( Intravenous New Bag/Given 06/24/22 0228)    ED Course/ Medical Decision Making/ A&P                           Medical Decision Making Patient with cough weakness and body aches   Problems Addressed: COVID-19: acute illness or injury    Details: admit to medicine Hyponatremia:    Details: Osm ordered and IV fluids to correct sodium admit to medicine  Amount and/or Complexity of Data Reviewed Independent Historian:     Details: son see above External Data Reviewed: notes.    Details: previous notes reviewed Labs: ordered.    Details: all labs reviewed:  covid is positive.  normal white count.  hemoglobin low at 11 platelets low at 106K.  Low sodium 119 low chloride 88 glucose elevated  118 creatinine 1.38 Radiology: ordered. ECG/medicine tests: ordered and independent interpretation performed.    Details: CXR consistent with covid Discussion of management or test interpretation with external provider(s): Dr. Marlowe Sax will admit   Risk Prescription drug management. Decision regarding hospitalization.  Critical Care Total time providing critical care: 30 minutes (IVF complexity of care and sodium correction )    Final Clinical Impression(s) / ED Diagnoses Final diagnoses:  COVID-19  Hyponatremia   The patient appears reasonably stabilized for admission considering the current resources, flow, and capabilities available in the ED at this time, and I doubt any other [facility] requiring further screening and/or treatment in the ED prior to admission.  Rx / DC Orders ED Discharge Orders     None         Daysha Ashmore, MD 06/24/22 [phone]

## 2022-06-24 NOTE — ED Notes (Signed)
Care Handoff/Report given to [facility] RN at this Time. All Questions answered.

## 2022-06-24 NOTE — ED Notes (Signed)
Dr. Randal Buba aware of na level of 119

## 2022-06-24 NOTE — Plan of Care (Signed)
[facility] will assume care on arrival to accepting facility. Until arrival, care as per EDP. However, [facility] available 24/7 for questions and assistance.  Nursing staff, please page [facility] Admits and Consults ([phone]) as soon as the patient arrives to the hospital.   

## 2022-06-25 ENCOUNTER — Inpatient Hospital Stay (HOSPITAL_COMMUNITY): Payer: No Typology Code available for payment source

## 2022-06-25 DIAGNOSIS — R079 Chest pain, unspecified: Secondary | ICD-10-CM

## 2022-06-25 DIAGNOSIS — Z953 Presence of xenogenic heart valve: Secondary | ICD-10-CM

## 2022-06-25 DIAGNOSIS — U071 COVID-19: Principal | ICD-10-CM

## 2022-06-25 DIAGNOSIS — R778 Other specified abnormalities of plasma proteins: Secondary | ICD-10-CM | POA: Diagnosis not present

## 2022-06-25 DIAGNOSIS — E871 Hypo-osmolality and hyponatremia: Secondary | ICD-10-CM

## 2022-06-25 DIAGNOSIS — G4733 Obstructive sleep apnea (adult) (pediatric): Secondary | ICD-10-CM

## 2022-06-25 DIAGNOSIS — I2510 Atherosclerotic heart disease of native coronary artery without angina pectoris: Secondary | ICD-10-CM | POA: Diagnosis not present

## 2022-06-25 DIAGNOSIS — Q231 Congenital insufficiency of aortic valve: Secondary | ICD-10-CM | POA: Diagnosis not present

## 2022-06-25 DIAGNOSIS — E7800 Pure hypercholesterolemia, unspecified: Secondary | ICD-10-CM

## 2022-06-25 LAB — COMPREHENSIVE METABOLIC PANEL
ALT: 27 U/L (ref 0–44)
AST: 35 U/L (ref 15–41)
Albumin: 3.8 g/dL (ref 3.5–5.0)
Alkaline Phosphatase: 71 U/L (ref 38–126)
Anion gap: 8 (ref 5–15)
BUN: 18 mg/dL (ref 8–23)
CO2: 22 mmol/L (ref 22–32)
Calcium: 8.8 mg/dL — ABNORMAL LOW (ref 8.9–10.3)
Chloride: 97 mmol/L — ABNORMAL LOW (ref 98–111)
Creatinine, Ser: 1.15 mg/dL (ref 0.61–1.24)
GFR, Estimated: >60 mL/min (ref >60)
Glucose, Bld: 116 mg/dL — ABNORMAL HIGH (ref 70–99)
Potassium: 3.6 mmol/L (ref 3.5–5.1)
Sodium: 127 mmol/L — ABNORMAL LOW (ref 135–145)
Total Bilirubin: 0.5 mg/dL (ref 0.3–1.2)
Total Protein: 6.7 g/dL (ref 6.5–8.1)

## 2022-06-25 LAB — LACTATE DEHYDROGENASE: LDH: 182 U/L (ref 98–192)

## 2022-06-25 LAB — CBC
HCT: 37.9 % — ABNORMAL LOW (ref 39.0–52.0)
Hemoglobin: 13 g/dL (ref 13.0–17.0)
MCH: 33.7 pg (ref 26.0–34.0)
MCHC: 34.3 g/dL (ref 30.0–36.0)
MCV: 98.2 fL (ref 80.0–100.0)
Platelets: 131 10*3/uL — ABNORMAL LOW (ref 150–400)
RBC: 3.86 MIL/uL — ABNORMAL LOW (ref 4.22–5.81)
RDW: 13 % (ref 11.5–15.5)
WBC: 5.4 10*3/uL (ref 4.0–10.5)
nRBC: 0 % (ref 0.0–0.2)

## 2022-06-25 LAB — D-DIMER, QUANTITATIVE: D-Dimer, Quant: 0.48 ug/mL-FEU (ref 0.00–0.50)

## 2022-06-25 LAB — TROPONIN I (HIGH SENSITIVITY)
Troponin I (High Sensitivity): 33 ng/L — ABNORMAL HIGH (ref <18)
Troponin I (High Sensitivity): 37 ng/L — ABNORMAL HIGH (ref <18)
Troponin I (High Sensitivity): 39 ng/L — ABNORMAL HIGH (ref <18)

## 2022-06-25 LAB — C-REACTIVE PROTEIN: CRP: 4.6 mg/dL — ABNORMAL HIGH (ref <1.0)

## 2022-06-25 LAB — ECHOCARDIOGRAM COMPLETE
AR max vel: 2.05 cm2
AV Area VTI: 2.11 cm2
AV Area mean vel: 2.06 cm2
AV Mean grad: 12 mmHg
AV Peak grad: 23.7 mmHg
Ao pk vel: 2.44 m/s
Area-P 1/2: 2.97 cm2
Calc EF: 71.6 %
Height: 71 in
S' Lateral: 2.6 cm
Single Plane A2C EF: 65.6 %
Single Plane A4C EF: 75.5 %
Weight: 3333.36 oz

## 2022-06-25 LAB — FERRITIN: Ferritin: 199 ng/mL (ref 24–336)

## 2022-06-25 LAB — TSH: TSH: 2.412 u[IU]/mL (ref 0.350–4.500)

## 2022-06-25 MED ORDER — ISOSORBIDE MONONITRATE ER 60 MG PO TB24
120.0000 mg | ORAL_TABLET | Freq: Every day | ORAL | Status: DC
  Administered 2022-06-25 – 2022-06-28 (×3): 120 mg via ORAL
  Filled 2022-06-25 (×3): qty 2

## 2022-06-25 MED ORDER — SODIUM CHLORIDE 0.9 % IV SOLN: INTRAVENOUS | Status: DC

## 2022-06-25 MED ORDER — ALBUTEROL SULFATE (2.5 MG/3ML) 0.083% IN NEBU: 2.5000 mg | INHALATION_SOLUTION | RESPIRATORY_TRACT | Status: DC | PRN

## 2022-06-25 MED ORDER — IPRATROPIUM-ALBUTEROL 0.5-2.5 (3) MG/3ML IN SOLN
3.0000 mL | Freq: Four times a day (QID) | RESPIRATORY_TRACT | Status: DC
Start: 2022-06-25 — End: 2022-06-25
  Administered 2022-06-25: 3 mL via RESPIRATORY_TRACT

## 2022-06-25 MED ORDER — ENOXAPARIN SODIUM 40 MG/0.4ML IJ SOSY
40.0000 mg | PREFILLED_SYRINGE | INTRAMUSCULAR | Status: DC
  Administered 2022-06-25 – 2022-06-28 (×4): 40 mg via SUBCUTANEOUS
  Filled 2022-06-25 (×4): qty 0.4

## 2022-06-25 MED ORDER — SODIUM CHLORIDE 0.9 % IV SOLN: INTRAVENOUS | Status: AC

## 2022-06-25 MED ORDER — METOPROLOL TARTRATE 12.5 MG HALF TABLET
12.5000 mg | ORAL_TABLET | Freq: Two times a day (BID) | ORAL | Status: DC
  Administered 2022-06-25: 12.5 mg via ORAL
  Filled 2022-06-25 (×2): qty 1

## 2022-06-25 MED ORDER — ATORVASTATIN CALCIUM 10 MG PO TABS
10.0000 mg | ORAL_TABLET | Freq: Every day | ORAL | Status: DC
  Administered 2022-06-26 – 2022-06-27 (×2): 10 mg via ORAL
  Filled 2022-06-25 (×4): qty 1

## 2022-06-25 MED ORDER — ASPIRIN 81 MG PO CHEW
81.0000 mg | CHEWABLE_TABLET | Freq: Every day | ORAL | Status: DC
  Administered 2022-06-25 – 2022-06-28 (×4): 81 mg via ORAL
  Filled 2022-06-25 (×4): qty 1

## 2022-06-25 NOTE — Evaluation (Signed)
Occupational Therapy Evaluation Patient Details Name: Travis Armstrong MRN: [id number] DOB: 30-Jul-1941 Today's Date: 06/25/2022   History of Present Illness Travis Armstrong is a 81 y.o. male with medical history significant of CAD, aortic valve disease, HTN ,HLD and presents with  generalized fatigue Found to have COVID and low NA.   Clinical Impression   Travis Armstrong demonstrates independence with ADLs and ambulation on today's evaluation. He was able to ambulate almost the full length of the hall without assistance. O2 sat normal. No deficits noted. No OT needs.      Recommendations for follow up therapy are one component of a multi-disciplinary discharge planning process, led by the attending physician.  Recommendations may be updated based on patient status, additional functional criteria and insurance authorization.   Follow Up Recommendations  No OT follow up    Assistance Recommended at Discharge None  Patient can return home with the following      Functional Status Assessment  Patient has not had a recent decline in their functional status  Equipment Recommendations  None recommended by OT    Recommendations for Other Services       Precautions / Restrictions Precautions Precautions: None Restrictions Weight Bearing Restrictions: No      Mobility Bed Mobility Overal bed mobility: Independent                  Transfers Overall transfer level: Independent                        Balance Overall balance assessment: Independent                                         ADL either performed or assessed with clinical judgement   ADL Overall ADL's : Independent                                             Vision Patient Visual Report: No change from baseline       Perception     Praxis      Pertinent Vitals/Pain Pain Assessment Pain Assessment: No/denies pain     Hand Dominance     Extremity/Trunk  Assessment Upper Extremity Assessment Upper Extremity Assessment: Overall WFL for tasks assessed   Lower Extremity Assessment Lower Extremity Assessment: Overall WFL for tasks assessed   Cervical / Trunk Assessment Cervical / Trunk Assessment: Normal   Communication Communication Communication: HOH   Cognition Arousal/Alertness: Awake/alert Behavior During Therapy: WFL for tasks assessed/performed Overall Cognitive Status: Within Functional Limits for tasks assessed                                       General Comments       Exercises     Shoulder Instructions      Home Living Family/patient expects to be discharged to:: Private residence Living Arrangements: Alone Available Help at Discharge: Family;Available 24 hours/day Type of Home: House Home Access: Stairs to enter [employer] of Steps: 7 Entrance Stairs-Rails: Right;Left;Can reach both       Bathroom Shower/Tub: Occupational psychologist: Handicapped height     Home Equipment:  None          Prior Functioning/Environment Prior Level of Function : Independent/Modified Independent             Mobility Comments: ind ADLs Comments: ind        OT Problem List:        OT Treatment/Interventions:      OT Goals(Current goals can be found in the care plan section) Acute Rehab OT Goals OT Goal Formulation: All assessment and education complete, DC therapy  OT Frequency:      Co-evaluation PT/OT/SLP Co-Evaluation/Treatment: Yes (co-eval)            AM-PAC OT "6 Clicks" Daily Activity     Outcome Measure Help from another person eating meals?: None Help from another person taking care of personal grooming?: None Help from another person toileting, which includes using toliet, bedpan, or urinal?: None Help from another person bathing (including washing, rinsing, drying)?: None Help from another person to put on and taking off regular upper body clothing?:  None Help from another person to put on and taking off regular lower body clothing?: None 6 Click Score: 24   End of Session Nurse Communication: Mobility status  Activity Tolerance: Patient tolerated treatment well Patient left: in chair;with call bell/phone within reach;with family/visitor present  OT Visit Diagnosis: Muscle weakness (generalized) (M62.81)                Time: 5072-2575 OT Time Calculation (min): 15 min Charges:  OT General Charges $OT Visit: 1 Visit OT Evaluation $OT Eval Low Complexity: 1 Low  Elyan Vanwieren, OTR/L [place]  Office [phone] Pager: [phone]   Lenward Chancellor 06/25/2022, 11:15 AM

## 2022-06-25 NOTE — Assessment & Plan Note (Addendum)
Continue to follow No chest pain at this time Obtain ecg  Obtain echogram in a.m.

## 2022-06-25 NOTE — Assessment & Plan Note (Signed)
Ordered CPAP

## 2022-06-25 NOTE — Assessment & Plan Note (Signed)
continue Lipitor 10 mg a day

## 2022-06-25 NOTE — Assessment & Plan Note (Signed)
Given cough advanced age will start on [place] Chest x-ray more consistent with atelectasis No hypoxia Obtain inflammatory markers  Order airborne precautions

## 2022-06-25 NOTE — Assessment & Plan Note (Signed)
Chronic stable continue aspirin 81 mg daily Lipitor 10 mg daily

## 2022-06-25 NOTE — Progress Notes (Signed)
PROGRESS NOTE    Travis Armstrong  JIR:[id number] DOB: Sep 23, 1941 DOA: 06/24/2022 PCP: Christain Sacramento, MD     Brief Narrative:  81 y.o. WM PMHx CAD, aortic valve disease, HTN ,HLD.  Not on home O2,   Presented with  generalized fatigue Have been feeling weak Found to have Covid and sodium 120  Started on IV Fluids CXR consistent with covid  [place] was at the beach and started to feel very bad he went to [place] and was diagnosed with COVID no hx of Hyponatremia  A bit of cough Denies any fever Reports CP with cough  He has been drinking lots of water at home No ETOH no toabcco   Subjective: A/O x4.  States has not been on O2 since admission.  Negative CP   Assessment & Plan: Covid vaccination;   Principal Problem:   [id number] virus infection Active Problems:   Hyperlipidemia   CAD (coronary artery disease)   Bicuspid aortic valve   S/P aortic valve replacement with bioprosthetic valve and CABG x   Hyponatremia   OSA (obstructive sleep apnea)   Elevated troponin  Bicuspid aortic valve -Chronic sp post replacement   CAD (coronary artery disease) -Chronic stable continue aspirin 81 mg daily Lipitor 10 mg daily  S/P aortic valve replacement with bioprosthetic valve and CABG x -Chronic stable followed by cardiology  Elevated troponin  Latest Reference Range & Units 06/24/22 23:52 06/25/22 03:02 06/25/22 09:36  Troponin I (High Sensitivity) <18 ng/L 33 (H) 37 (H) 39 (H)  (H): Data is abnormally high -7/7 Echocardiogram pending.  Counseled if significant changes will consult cardiology.  Given that patient is asymptomatic and COVID-positive even if requires cardiac catheterization unlikely to have catheterization until resolution of COVID.   COVID-19 virus infection COVID-19 Labs  Recent Labs    06/24/22 2104 06/25/22 0936  DDIMER  --  0.48  FERRITIN 191 199  LDH 185 182  CRP 5.2* 4.6*    Lab Results  Component Value Date   SARSCOV2NAA POSITIVE (A) 06/23/2022   Given cough advanced age will start on Molnupiravir (Lagevrio) Chest x-ray more consistent with atelectasis -7/7 not on home O2, now requiring 2 L O2 via [place] - 7/7 continuous pulse ox - Flutter valve - Incentive spirometry - DuoNeb QID - Mucinex DM BID      HLD -Lipitor 10 mg a day -Lipid panel pending     Hyponatremia Sodium now up to 130.  Continue to monitor repeat in 6 hours or so Slowed IV fluids to 75 mils an hour to avoid rapid correction Lab Results  Component Value Date   NA 127 (L) 06/25/2022   NA 130 (L) 06/24/2022   NA 120 (L) 06/24/2022   NA 119 (LL) 06/23/2022   NA 133 (L) 08/27/2016  -7/7 asymptomatic OSA (obstructive sleep apnea) -CPAP per respiratory        Mobility Assessment (last 72 hours)     Mobility Assessment     Row Name 06/24/22 18:32:53           Does patient have an order for bedrest or is patient medically unstable No - Continue assessment       What is the highest level of mobility based on the progressive mobility assessment? Level 5 (Walks with assist in room/hall) - Balance while stepping forward/back and can walk in room with assist - Complete                  Interdisciplinary Goals  of Care Family Meeting   Date carried out: 06/25/2022  Location of the meeting:   Member's involved:   Durable Power of Tour manager:     Discussion: We discussed goals of care for [name] .    Code status:   Disposition:   Time spent for the meeting:     Fernand Sorbello, Geraldo Docker, MD  06/25/2022, 8:11 AM         DVT prophylaxis: SCD Code Status: Full Family Communication:  Status is: Inpatient    Dispo: The patient is from: Home              Anticipated d/c is to: Home              Anticipated d/c date is: 2 days              Patient currently is not medically stable to d/c.      Consultants:    Procedures/Significant Events:    I have personally reviewed and interpreted all  radiology studies and my findings are as above.  VENTILATOR SETTINGS: Nasal cannula 7/7 Flow 2 L/min SPO2 92%   Cultures   Antimicrobials: Anti-infectives (From admission, onward)    Start     Dose/Rate Route Frequency Ordered Stop   06/24/22 1200  molnupiravir EUA (LAGEVRIO) capsule 800 mg        4 capsule Oral 2 times daily 06/24/22 0608 06/29/22 0959         Devices    LINES / TUBES:      Continuous Infusions:  sodium chloride 75 mL/hr at 06/25/22 0149     Objective: Vitals:   06/24/22 2233 06/25/22 0029 06/25/22 0117 06/25/22 0541  BP: (!) 167/73   140/68  Pulse: (!) 59   (!) 57  Resp: 20   18  Temp: 98.2 F (36.8 C)   98.8 F (37.1 C)  TempSrc:    Oral  SpO2: 99% 99% 96% 92%  Weight:      Height:        Intake/Output Summary (Last 24 hours) at 06/25/2022 8338 Last data filed at 06/25/2022 0750 Gross per 24 hour  Intake --  Output 1200 ml  Net -1200 ml   Filed Weights   06/23/22 2326 06/24/22 1832  Weight: 95.3 kg 94.5 kg    Examination:  General: A/O x4, No acute respiratory distress Eyes: negative scleral hemorrhage, negative anisocoria, negative icterus ENT: Negative Runny nose, negative gingival bleeding, Neck:  Negative scars, masses, torticollis, lymphadenopathy, JVD Lungs: Clear to auscultation bilaterally without wheezes or crackles Cardiovascular: Regular rate and rhythm without murmur gallop or rub normal S1 and S2 Abdomen: negative abdominal pain, nondistended, positive soft, bowel sounds, no rebound, no ascites, no appreciable mass Extremities: No significant cyanosis, clubbing, or edema bilateral lower extremities Skin: Negative rashes, lesions, ulcers Psychiatric:  Negative depression, negative anxiety, negative fatigue, negative mania  Central nervous system:  Cranial nerves II through XII intact, tongue/uvula midline, all extremities muscle strength 5/5, sensation intact throughout, negative dysarthria, negative expressive  aphasia, negative receptive aphasia.  .     Data Reviewed: Care during the described time interval was provided by me .  I have reviewed this patient's available data, including medical history, events of note, physical examination, and all test results as part of my evaluation.  CBC: Recent Labs  Lab 06/23/22 2335 06/24/22 2104 06/25/22 0302  WBC 7.2 4.6 5.4  NEUTROABS 5.3 2.5  --  HGB 11.0* 12.1* 13.0  HCT 31.4* 35.1* 37.9*  MCV 92.6 96.2 98.2  PLT 106* 129* 191*   Basic Metabolic Panel: Recent Labs  Lab 06/23/22 2335 06/24/22 0121 06/24/22 2104 06/25/22 0302  NA 119* 120* 130* 127*  K 4.4 5.0 4.1 3.6  CL 88* 88* 98 97*  CO2 '24 25 25 22  '$ GLUCOSE 118* 116* 96 116*  BUN '17 16 16 18  '$ CREATININE 1.38* 1.37* 1.30* 1.15  CALCIUM 9.3 9.6 9.0 8.8*  MG  --   --  2.0  --   PHOS  --   --  2.4*  --    GFR: Estimated Creatinine Clearance: 59.1 mL/min (by C-G formula based on SCr of 1.15 mg/dL). Liver Function Tests: Recent Labs  Lab 06/24/22 2104 06/25/22 0302  AST 33 35  ALT 26 27  ALKPHOS 63 71  BILITOT 0.8 0.5  PROT 6.6 6.7  ALBUMIN 3.7 3.8   No results for input(s): "LIPASE", "AMYLASE" in the last 168 hours. No results for input(s): "AMMONIA" in the last 168 hours. Coagulation Profile: No results for input(s): "INR", "PROTIME" in the last 168 hours. Cardiac Enzymes: No results for input(s): "CKTOTAL", "CKMB", "CKMBINDEX", "TROPONINI" in the last 168 hours. BNP (last 3 results) No results for input(s): "PROBNP" in the last 8760 hours. HbA1C: No results for input(s): "HGBA1C" in the last 72 hours. CBG: No results for input(s): "GLUCAP" in the last 168 hours. Lipid Profile: No results for input(s): "CHOL", "HDL", "LDLCALC", "TRIG", "CHOLHDL", "LDLDIRECT" in the last 72 hours. Thyroid Function Tests: Recent Labs    06/25/22 0302  TSH 2.412   Anemia Panel: Recent Labs    06/24/22 2104  FERRITIN 191   Sepsis Labs: Recent Labs  Lab 06/24/22 2104   PROCALCITON <0.10    Recent Results (from the past 240 hour(s))  SARS Coronavirus 2 by RT PCR (hospital order, performed in [facility] hospital lab) *cepheid single result test* Anterior Nasal Swab     Status: Abnormal   Collection Time: 06/23/22 11:08 PM   Specimen: Anterior Nasal Swab  Result Value Ref Range Status   SARS Coronavirus 2 by RT PCR POSITIVE (A) NEGATIVE Final    Comment: (NOTE) SARS-CoV-2 target nucleic acids are DETECTED  SARS-CoV-2 RNA is generally detectable in upper respiratory specimens  during the acute phase of infection.  Positive results are indicative  of the presence of the identified virus, but do not rule out bacterial infection or co-infection with other pathogens not detected by the test.  Clinical correlation with patient history and  other diagnostic information is necessary to determine patient infection status.  The expected result is negative.  Fact Sheet for Patients:   https://www.patel.info/   Fact Sheet for Healthcare Providers:   https://hall.com/    This test is not yet approved or cleared by the [place] FDA and  has been authorized for detection and/or diagnosis of SARS-CoV-2 by FDA under an Emergency Use Authorization (EUA).  This EUA will remain in effect (meaning this test can be used) for the duration of  the COVID-19 declaration under Section 564(b)(1)  of the Act, 21 U.S.C. section 360-bbb-3(b)(1), unless the authorization is terminated or revoked sooner.   Performed at [facility], [address] [place], [place] [address]          Radiology Studies: DG Chest 2 View  Result Date: 06/23/2022 CLINICAL DATA:  Cough EXAM: CHEST - 2 VIEW COMPARISON:  06/03/2014 FINDINGS: Right basilar linear opacity. Remote  median sternotomy and valvuloplasty. No pleural effusion or pneumothorax. IMPRESSION: Right basilar opacities, likely atelectasis. Electronically  Signed   By: Ulyses Jarred M.D.   On: 06/23/2022 23:51        Scheduled Meds:  aspirin  81 mg Oral Daily   atorvastatin  10 mg Oral Daily   enoxaparin (LOVENOX) injection  40 mg Subcutaneous Q24H   isosorbide mononitrate  120 mg Oral Daily   metoprolol tartrate  12.5 mg Oral BID   molnupiravir EUA  4 capsule Oral BID   sodium chloride flush  3 mL Intravenous Q12H   Continuous Infusions:  sodium chloride 75 mL/hr at 06/25/22 0149     LOS: 1 day    Time spent:40 min    Kelaiah Escalona, Geraldo Docker, MD [facility]   If 7PM-7AM, please contact night-coverage 06/25/2022, 8:11 AM

## 2022-06-25 NOTE — Plan of Care (Signed)
  Problem: Education: Goal: Knowledge of General Education information will improve Description: Including pain rating scale, medication(s)/side effects and non-pharmacologic comfort measures Outcome: Progressing   Problem: Health Behavior/Discharge Planning: Goal: Ability to manage health-related needs will improve Outcome: Progressing   Problem: Clinical Measurements: Goal: Ability to maintain clinical measurements within normal limits will improve Outcome: Progressing Goal: Will remain free from infection Outcome: Progressing Goal: Diagnostic test results will improve Outcome: Progressing Goal: Respiratory complications will improve Outcome: Progressing Goal: Cardiovascular complication will be avoided Outcome: Progressing   Problem: Activity: Goal: Risk for activity intolerance will decrease Outcome: Progressing   Problem: Nutrition: Goal: Adequate nutrition will be maintained Outcome: Progressing   Problem: Coping: Goal: Level of anxiety will decrease Outcome: Progressing   Problem: Elimination: Goal: Will not experience complications related to bowel motility Outcome: Progressing Goal: Will not experience complications related to urinary retention Outcome: Progressing   Problem: Pain Managment: Goal: General experience of comfort will improve Outcome: Progressing   Problem: Safety: Goal: Ability to remain free from injury will improve Outcome: Progressing   Problem: Skin Integrity: Goal: Risk for impaired skin integrity will decrease Outcome: Progressing   Problem: Education: Goal: Knowledge of General Education information will improve Description: Including pain rating scale, medication(s)/side effects and non-pharmacologic comfort measures Outcome: Progressing   Problem: Health Behavior/Discharge Planning: Goal: Ability to manage health-related needs will improve Outcome: Progressing   Problem: Clinical Measurements: Goal: Ability to maintain  clinical measurements within normal limits will improve Outcome: Progressing Goal: Will remain free from infection Outcome: Progressing Goal: Diagnostic test results will improve Outcome: Progressing Goal: Respiratory complications will improve Outcome: Progressing Goal: Cardiovascular complication will be avoided Outcome: Progressing   Problem: Activity: Goal: Risk for activity intolerance will decrease Outcome: Progressing   Problem: Nutrition: Goal: Adequate nutrition will be maintained Outcome: Progressing   Problem: Coping: Goal: Level of anxiety will decrease Outcome: Progressing   Problem: Elimination: Goal: Will not experience complications related to bowel motility Outcome: Progressing Goal: Will not experience complications related to urinary retention Outcome: Progressing   Problem: Pain Managment: Goal: General experience of comfort will improve Outcome: Progressing   Problem: Safety: Goal: Ability to remain free from injury will improve Outcome: Progressing   Problem: Skin Integrity: Goal: Risk for impaired skin integrity will decrease Outcome: Progressing   Problem: Education: Goal: Knowledge of risk factors and measures for prevention of condition will improve Outcome: Progressing   Problem: Coping: Goal: Psychosocial and spiritual needs will be supported Outcome: Progressing   Problem: Respiratory: Goal: Will maintain a patent airway Outcome: Progressing Goal: Complications related to the disease process, condition or treatment will be avoided or minimized Outcome: Progressing

## 2022-06-25 NOTE — Assessment & Plan Note (Addendum)
Sodium now up to 130.  Continue to monitor repeat in 6 hours or so Slowed IV fluids to 75 mils an hour to avoid rapid correction Obtain urine electrolytes check TSH

## 2022-06-25 NOTE — Progress Notes (Signed)
  Transition of Care [facility]) Screening Note   Patient Details  Name: Travis Armstrong Date of Birth: 1941-03-31   Transition of Care [facility]) CM/SW Contact:    Vassie Moselle, LCSW Phone Number: 06/25/2022, 2:00 PM    Transition of Care Department [facility]) has reviewed patient and no TOC needs have been identified at this time. We will continue to monitor patient advancement through interdisciplinary progression rounds. If new patient transition needs arise, please place a TOC consult.

## 2022-06-25 NOTE — Assessment & Plan Note (Signed)
Chronic sp post replacement

## 2022-06-25 NOTE — Progress Notes (Signed)
RT dc'd the breathing per Pt. The Pt said he wasn't having any issues with his breathing and didn't need them. The Pt does anve a alb prn if he needs one. RT will continue to monitor

## 2022-06-25 NOTE — Evaluation (Signed)
Physical Therapy Evaluation Patient Details Name: Travis Armstrong MRN: [id number] DOB: Aug 12, 1941 Today's Date: 06/25/2022  History of Present Illness  Travis Armstrong is a 81 y.o. male with medical history significant of CAD, aortic valve disease, HTN ,HLD and presents with  generalized fatigue Found to have COVID and low NA.  Clinical Impression  Patient evaluated by Physical Therapy with no further acute PT needs identified. All education has been completed and the patient has no further questions. Pt was IND with all mobility tasks today and completed Dynamic Gait Index scoring 21/24 indicating pt is not at increased risk for falls. SpO2 monitored WFL.  PT is signing off, should needs change please re-consult. Thank you for this referral.          Recommendations for follow up therapy are one component of a multi-disciplinary discharge planning process, led by the attending physician.  Recommendations may be updated based on patient status, additional functional criteria and insurance authorization.  Follow Up Recommendations No PT follow up      Assistance Recommended at Discharge PRN  Patient can return home with the following  Help with stairs or ramp for entrance;Assist for transportation    Equipment Recommendations None recommended by PT  Recommendations for Other Services       Functional Status Assessment Patient has not had a recent decline in their functional status     Precautions / Restrictions Precautions Precautions: None Restrictions Weight Bearing Restrictions: No      Mobility  Bed Mobility Overal bed mobility: Independent                  Transfers Overall transfer level: Independent                      Ambulation/Gait Ambulation/Gait assistance: Independent Gait Distance (Feet): 300 Feet Assistive device: None Gait Pattern/deviations: WFL(Within Functional Limits)       General Gait Details: Pt ambulated independently; taken  through DGI.  Stairs            Wheelchair Mobility    Modified Rankin (Stroke Patients Only)       Balance Overall balance assessment: Independent                               Standardized Balance Assessment Standardized Balance Assessment : Dynamic Gait Index   Dynamic Gait Index Level Surface: Normal Change in Gait Speed: Normal Gait with Horizontal Head Turns: Normal Gait with Vertical Head Turns: Normal Gait and Pivot Turn: Mild Impairment Step Over Obstacle: Normal Step Around Obstacles: Mild Impairment Steps: Mild Impairment Total Score: 21       Pertinent Vitals/Pain Pain Assessment Pain Assessment: No/denies pain    Home Living Family/patient expects to be discharged to:: Private residence Living Arrangements: Alone Available Help at Discharge: Family;Available 24 hours/day Type of Home: House Home Access: Stairs to enter Entrance Stairs-Rails: Right;Left;Can reach both Entrance Stairs-Number of Steps: 7     Home Equipment: None Additional Comments: Son present    Prior Function Prior Level of Function : Independent/Modified Independent             Mobility Comments: ind ADLs Comments: ind     Hand Dominance        Extremity/Trunk Assessment   Upper Extremity Assessment Upper Extremity Assessment: Overall WFL for tasks assessed    Lower Extremity Assessment Lower Extremity Assessment: Overall WFL for tasks assessed  Cervical / Trunk Assessment Cervical / Trunk Assessment: Normal  Communication   Communication: HOH  Cognition Arousal/Alertness: Awake/alert Behavior During Therapy: WFL for tasks assessed/performed Overall Cognitive Status: Within Functional Limits for tasks assessed                                          General Comments      Exercises     Assessment/Plan    PT Assessment Patient does not need any further PT services  PT Problem List         PT Treatment  Interventions      PT Goals (Current goals can be found in the Care Plan section)  Acute Rehab PT Goals Patient Stated Goal: to go home PT Goal Formulation: With patient    Frequency       Co-evaluation               AM-PAC PT "6 Clicks" Mobility  Outcome Measure Help needed turning from your back to your side while in a flat bed without using bedrails?: None Help needed moving from lying on your back to sitting on the side of a flat bed without using bedrails?: None Help needed moving to and from a bed to a chair (including a wheelchair)?: None Help needed standing up from a chair using your arms (e.g., wheelchair or bedside chair)?: None Help needed to walk in hospital room?: None Help needed climbing 3-5 steps with a railing? : A Little 6 Click Score: 23    End of Session   Activity Tolerance: Patient tolerated treatment well;No increased pain Patient left: in chair;with call bell/phone within reach;with family/visitor present Nurse Communication: Mobility status PT Visit Diagnosis: Difficulty in walking, not elsewhere classified (R26.2)    Time: 1020-1041 PT Time Calculation (min) (ACUTE ONLY): 21 min   Charges:   PT Evaluation $PT Eval Low Complexity: [place], PT, DPT [facility] Office: [phone] Pager: [phone]  Coolidge Breeze 06/25/2022, 1:39 PM

## 2022-06-25 NOTE — Assessment & Plan Note (Signed)
Chronic stable followed by cardiology

## 2022-06-25 NOTE — Progress Notes (Addendum)
2D echo attempted, but patient in chair and on an important phone call. Will try later

## 2022-06-25 NOTE — Progress Notes (Signed)
  Echocardiogram 2D Echocardiogram has been performed.  Travis Armstrong 06/25/2022, 2:56 PM

## 2022-06-26 ENCOUNTER — Inpatient Hospital Stay (HOSPITAL_COMMUNITY): Payer: No Typology Code available for payment source

## 2022-06-26 DIAGNOSIS — I2510 Atherosclerotic heart disease of native coronary artery without angina pectoris: Secondary | ICD-10-CM | POA: Diagnosis not present

## 2022-06-26 DIAGNOSIS — Q231 Congenital insufficiency of aortic valve: Secondary | ICD-10-CM | POA: Diagnosis not present

## 2022-06-26 DIAGNOSIS — U071 COVID-19: Secondary | ICD-10-CM | POA: Diagnosis present

## 2022-06-26 DIAGNOSIS — R778 Other specified abnormalities of plasma proteins: Secondary | ICD-10-CM | POA: Diagnosis not present

## 2022-06-26 LAB — LIPID PANEL
Cholesterol: 79 mg/dL (ref 0–200)
HDL: 29 mg/dL — ABNORMAL LOW (ref >40)
LDL Cholesterol: 40 mg/dL (ref 0–99)
Total CHOL/HDL Ratio: 2.7 RATIO
Triglycerides: 49 mg/dL (ref <150)
VLDL: 10 mg/dL (ref 0–40)

## 2022-06-26 LAB — COMPREHENSIVE METABOLIC PANEL
ALT: 24 U/L (ref 0–44)
AST: 33 U/L (ref 15–41)
Albumin: 3.3 g/dL — ABNORMAL LOW (ref 3.5–5.0)
Alkaline Phosphatase: 59 U/L (ref 38–126)
Anion gap: 8 (ref 5–15)
BUN: 20 mg/dL (ref 8–23)
CO2: 23 mmol/L (ref 22–32)
Calcium: 8.5 mg/dL — ABNORMAL LOW (ref 8.9–10.3)
Chloride: 95 mmol/L — ABNORMAL LOW (ref 98–111)
Creatinine, Ser: 1.06 mg/dL (ref 0.61–1.24)
GFR, Estimated: >60 mL/min (ref >60)
Glucose, Bld: 109 mg/dL — ABNORMAL HIGH (ref 70–99)
Potassium: 4 mmol/L (ref 3.5–5.1)
Sodium: 126 mmol/L — ABNORMAL LOW (ref 135–145)
Total Bilirubin: 0.5 mg/dL (ref 0.3–1.2)
Total Protein: 5.9 g/dL — ABNORMAL LOW (ref 6.5–8.1)

## 2022-06-26 LAB — CBC WITH DIFFERENTIAL/PLATELET
Abs Immature Granulocytes: 0.01 10*3/uL (ref 0.00–0.07)
Basophils Absolute: 0 10*3/uL (ref 0.0–0.1)
Basophils Relative: 0 %
Eosinophils Absolute: 0 10*3/uL (ref 0.0–0.5)
Eosinophils Relative: 1 %
HCT: 30.8 % — ABNORMAL LOW (ref 39.0–52.0)
Hemoglobin: 11 g/dL — ABNORMAL LOW (ref 13.0–17.0)
Immature Granulocytes: 0 %
Lymphocytes Relative: 39 %
Lymphs Abs: 1.2 10*3/uL (ref 0.7–4.0)
MCH: 34.1 pg — ABNORMAL HIGH (ref 26.0–34.0)
MCHC: 35.7 g/dL (ref 30.0–36.0)
MCV: 95.4 fL (ref 80.0–100.0)
Monocytes Absolute: 0.6 10*3/uL (ref 0.1–1.0)
Monocytes Relative: 21 %
Neutro Abs: 1.2 10*3/uL — ABNORMAL LOW (ref 1.7–7.7)
Neutrophils Relative %: 39 %
Platelets: 119 10*3/uL — ABNORMAL LOW (ref 150–400)
RBC: 3.23 MIL/uL — ABNORMAL LOW (ref 4.22–5.81)
RDW: 12.8 % (ref 11.5–15.5)
WBC: 3 10*3/uL — ABNORMAL LOW (ref 4.0–10.5)
nRBC: 0 % (ref 0.0–0.2)

## 2022-06-26 LAB — FERRITIN: Ferritin: 190 ng/mL (ref 24–336)

## 2022-06-26 LAB — LACTATE DEHYDROGENASE: LDH: 169 U/L (ref 98–192)

## 2022-06-26 LAB — C-REACTIVE PROTEIN: CRP: 2.3 mg/dL — ABNORMAL HIGH (ref <1.0)

## 2022-06-26 LAB — D-DIMER, QUANTITATIVE: D-Dimer, Quant: 0.31 ug/mL-FEU (ref 0.00–0.50)

## 2022-06-26 LAB — MAGNESIUM: Magnesium: 1.8 mg/dL (ref 1.7–2.4)

## 2022-06-26 LAB — TROPONIN I (HIGH SENSITIVITY)
Troponin I (High Sensitivity): 24 ng/L — ABNORMAL HIGH (ref <18)
Troponin I (High Sensitivity): 22 ng/L — ABNORMAL HIGH (ref <18)

## 2022-06-26 LAB — PHOSPHORUS: Phosphorus: 2.9 mg/dL (ref 2.5–4.6)

## 2022-06-26 MED ORDER — IOHEXOL 300 MG/ML  SOLN
100.0000 mL | Freq: Once | INTRAMUSCULAR | Status: AC | PRN
  Administered 2022-06-26: 100 mL via INTRAVENOUS

## 2022-06-26 MED ORDER — HYDRALAZINE HCL 20 MG/ML IJ SOLN
5.0000 mg | Freq: Four times a day (QID) | INTRAMUSCULAR | Status: DC | PRN
  Administered 2022-06-26: 5 mg via INTRAVENOUS
  Filled 2022-06-26: qty 1

## 2022-06-26 MED ORDER — METOPROLOL TARTRATE 12.5 MG HALF TABLET
12.5000 mg | ORAL_TABLET | Freq: Two times a day (BID) | ORAL | Status: DC
  Administered 2022-06-27: 12.5 mg via ORAL
  Filled 2022-06-26: qty 1

## 2022-06-26 NOTE — Progress Notes (Signed)
At [place], patient called asking for someone to talk to, he states he doesn't know where he is, re-oriented him that he is at the hospital and he has Covid. Patient became emotional because of what has happened but he was alert and oriented x 4 when this nurse has asked him again.

## 2022-06-26 NOTE — Discharge Summary (Signed)
Physician Discharge Summary  Travis Armstrong OAC:[id number] DOB: 06-22-1941 DOA: 06/24/2022  PCP: Christain [place], MD  Admit date: 06/24/2022 Discharge date: 06/26/2022  Time spent: 35 minutes  Recommendations for Outpatient Follow-up:   Bicuspid aortic valve -Chronic sp post replacement   CAD (coronary artery disease) -Chronic stable continue aspirin 81 mg daily Lipitor 10 mg daily   S/P aortic valve replacement with bioprosthetic valve and CABG x -Chronic stable followed by cardiology   COVID-19 virus infection COVID-19 Labs  Recent Labs    06/24/22 2104 06/25/22 0936 06/26/22 0646  DDIMER  --  0.48 0.31  FERRITIN 191 199  --   LDH 185 182  --   CRP 5.2* 4.6*  --     Lab Results  Component Value Date   SARSCOV2NAA POSITIVE (A) 06/23/2022  Given cough advanced age will start on Molnupiravir (Lagevrio) Chest x-ray more consistent with atelectasis -7/7 not on home O2, now requiring 2 L O2 via [place] - 7/7 continuous pulse ox - Flutter valve - Incentive spirometry - DuoNeb QID - Mucinex DM BID       HLD -Lipitor 10 mg a day -Lipid panel pending     Hyponatremia Sodium now up to 130.  Continue to monitor repeat in 6 hours or so Slowed IV fluids to 75 mils an hour to avoid rapid correction   OSA (obstructive sleep apnea) -CPAP per respiratory     Discharge Diagnoses:  Principal Problem:   COVID-19 virus infection Active Problems:   Hyperlipidemia   CAD (coronary artery disease)   Bicuspid aortic valve   S/P aortic valve replacement with bioprosthetic valve and CABG x   Hyponatremia   OSA (obstructive sleep apnea)   Elevated troponin   Discharge Condition: ***  Diet recommendation: ***  Filed Weights   06/23/22 2326 06/24/22 1832  Weight: 95.3 kg 94.5 kg    History of present illness:  ***  Hospital Course:  ***  Procedures: *** (i.e. Studies not automatically included, echos, thoracentesis, etc; not x-rays)  Consultations: ***  Cultures   ***  Antibiotics ***   Discharge Exam: Vitals:   06/25/22 1343 06/25/22 1947 06/25/22 2157 06/26/22 0228  BP: 108/75 (!) 146/71 126/67 (!) 153/67  Pulse: (!) 51 (!) 50 (!) 51 (!) 45  Resp: '18 17 18 18  '$ Temp: 97.9 F (36.6 C) 97.6 F (36.4 C) 98 F (36.7 C) (!) 97.5 F (36.4 C)  TempSrc: Oral  Oral Oral  SpO2: 97% 100% 97% 98%  Weight:      Height:        General: *** Cardiovascular: *** Respiratory: ***  Discharge Instructions   Allergies as of 06/26/2022       Reactions   Ranexa [ranolazine] Other (See Comments)   dizziness   Penicillins Rash   Has patient had a PCN reaction causing immediate rash, facial/tongue/throat swelling, SOB or lightheadedness with hypotension: Yes Has patient had a PCN reaction causing severe rash involving mucus membranes or skin necrosis: No Has patient had a PCN reaction that required hospitalization No Has patient had a PCN reaction occurring within the last 10 years: No If all of the above answers are "NO", then may proceed with Cephalosporin use. ON FEET   Other Other (See Comments)   Medication for swollen prostate (unsure of name) - passed out Medication for swollen prostate (unsure of name) - passed out     Med Rec must be completed prior to using this [facility]***  Allergies  Allergen Reactions   Ranexa [Ranolazine] Other (See Comments)    dizziness   Penicillins Rash    Has patient had a PCN reaction causing immediate rash, facial/tongue/throat swelling, SOB or lightheadedness with hypotension: Yes Has patient had a PCN reaction causing severe rash involving mucus membranes or skin necrosis: No Has patient had a PCN reaction that required hospitalization No Has patient had a PCN reaction occurring within the last 10 years: No If all of the above answers are "NO", then may proceed with Cephalosporin use.  ON FEET   Other Other (See Comments)    Medication for swollen prostate (unsure of name) - passed  out Medication for swollen prostate (unsure of name) - passed out      The results of significant diagnostics from this hospitalization (including imaging, microbiology, ancillary and laboratory) are listed below for reference.    Significant Diagnostic Studies: ECHOCARDIOGRAM COMPLETE  Result Date: 06/25/2022    ECHOCARDIOGRAM REPORT   Patient Name:   Travis Armstrong Date of Exam: 06/25/2022 Medical Rec #:  [id number]    Height:       71.0 in Accession #:    [id number]   Weight:       208.3 lb Date of Birth:  05-Sep-1941     BSA:          2.145 m Patient Age:    81 years     BP:           132/59 mmHg Patient Gender: M            HR:           51 bpm. Exam Location:  Inpatient Procedure: 2D Echo, Cardiac Doppler and Color Doppler Indications:    R07.9* Chest pain, unspecified  History:        Patient has prior history of Echocardiogram examinations, most                 recent 11/09/2019. CAD, Prior CABG, Aortic Valve Disease,                 Signs/Symptoms:Chest Pain; Risk Factors:Dyslipidemia and Sleep                 Apnea. Covid positive. AVR. Bicuspid AOV.                 Aortic Valve: 25 mm bioprosthetic valve is present in the aortic                 position. Procedure Date: 05/03/2014.  Sonographer:    Roseanna Rainbow RDCS Referring Phys: [place]  1. Left ventricular ejection fraction, by estimation, is 65 to 70%. The left ventricle has normal function. The left ventricle has no regional wall motion abnormalities. There is mild left ventricular hypertrophy. Left ventricular diastolic parameters are indeterminate.  2. Right ventricular systolic function is normal. The right ventricular size is mildly enlarged. There is normal pulmonary artery systolic pressure. The estimated right ventricular systolic pressure is 85.6 mmHg.  3. Right atrial size was severely dilated.  4. The mitral valve is normal in structure. Trivial mitral valve regurgitation. No evidence of mitral stenosis.  5.  There is a 25 mm bioprosthetic valve present in the aortic position     Aortic valve regurgitation is not visualized. MG 2mHg, increased from prior echo 11/09/19 (844mg). Normal structure and function of prosthetic valve. Vmax 2.5 m/s, MG 13 mmHg, EOA 1.8 cm^2, DI 0.53  6.  The inferior vena cava is dilated in size with >50% respiratory variability, suggesting right atrial pressure of 8 mmHg. FINDINGS  Left Ventricle: Left ventricular ejection fraction, by estimation, is 65 to 70%. The left ventricle has normal function. The left ventricle has no regional wall motion abnormalities. The left ventricular internal cavity size was normal in size. There is  mild left ventricular hypertrophy. Left ventricular diastolic parameters are indeterminate. Right Ventricle: The right ventricular size is mildly enlarged. Right vetricular wall thickness was not well visualized. Right ventricular systolic function is normal. There is normal pulmonary artery systolic pressure. The tricuspid regurgitant velocity  is 1.88 m/s, and with an assumed right atrial pressure of 8 mmHg, the estimated right ventricular systolic pressure is 14.4 mmHg. Left Atrium: Left atrial size was normal in size. Right Atrium: Right atrial size was severely dilated. Pericardium: There is no evidence of pericardial effusion. Mitral Valve: The mitral valve is normal in structure. Trivial mitral valve regurgitation. No evidence of mitral valve stenosis. Tricuspid Valve: The tricuspid valve is normal in structure. Tricuspid valve regurgitation is trivial. Aortic Valve: The aortic valve has been repaired/replaced. Aortic valve regurgitation is not visualized. Aortic valve mean gradient measures 12.0 mmHg. Aortic valve peak gradient measures 23.7 mmHg. Aortic valve area, by VTI measures 2.11 cm. There is a  25 mm bioprosthetic valve present in the aortic position. Procedure Date: 05/03/2014. Pulmonic Valve: The pulmonic valve was not well visualized. Pulmonic  valve regurgitation is not visualized. Aorta: The aortic root and ascending aorta are structurally normal, with no evidence of dilitation. Venous: The inferior vena cava is dilated in size with greater than 50% respiratory variability, suggesting right atrial pressure of 8 mmHg. IAS/Shunts: The interatrial septum was not well visualized.  LEFT VENTRICLE PLAX 2D LVIDd:         4.50 cm     Diastology LVIDs:         2.60 cm     LV e' medial:    4.61 cm/s LV PW:         1.20 cm     LV E/e' medial:  21.2 LV IVS:        1.10 cm     LV e' lateral:   4.72 cm/s LVOT diam:     2.30 cm     LV E/e' lateral: 20.7 LV SV:         112 LV SV Index:   52 LVOT Area:     4.15 cm  LV Volumes (MOD) LV vol d, MOD A2C: 63.6 ml LV vol d, MOD A4C: 90.7 ml LV vol s, MOD A2C: 21.9 ml LV vol s, MOD A4C: 22.2 ml LV SV MOD A2C:     41.7 ml LV SV MOD A4C:     90.7 ml LV SV MOD BP:      56.1 ml RIGHT VENTRICLE             IVC RV S prime:     11.10 cm/s  IVC diam: 2.20 cm TAPSE (M-mode): 2.0 cm LEFT ATRIUM             Index        RIGHT ATRIUM           Index LA diam:        3.90 cm 1.82 cm/m   RA Area:     34.30 cm LA Vol (A2C):   49.4 ml 23.03 ml/m  RA Volume:   128.00 ml 59.66 ml/m LA  Vol (A4C):   42.4 ml 19.76 ml/m LA Biplane Vol: 46.6 ml 21.72 ml/m  AORTIC VALVE AV Area (Vmax):    2.05 cm AV Area (Vmean):   2.06 cm AV Area (VTI):     2.11 cm AV Vmax:           243.67 cm/s AV Vmean:          155.667 cm/s AV VTI:            0.531 m AV Peak Grad:      23.7 mmHg AV Mean Grad:      12.0 mmHg LVOT Vmax:         120.50 cm/s LVOT Vmean:        77.150 cm/s LVOT VTI:          0.270 m LVOT/AV VTI ratio: 0.51  AORTA Ao Root diam: 3.20 cm Ao Asc diam:  3.60 cm MITRAL VALVE               TRICUSPID VALVE MV Area (PHT): 2.97 cm    TR Peak grad:   14.1 mmHg MV Decel Time: 255 msec    TR Vmax:        188.00 cm/s MV E velocity: 97.80 cm/s MV A velocity: 62.75 cm/s  SHUNTS MV E/A ratio:  1.56        Systemic VTI:  0.27 m                             Systemic Diam: 2.30 cm Oswaldo Milian MD Electronically signed by Oswaldo Milian MD Signature Date/Time: 06/25/2022/6:59:38 PM    Final    DG Chest 2 View  Result Date: 06/23/2022 CLINICAL DATA:  Cough EXAM: CHEST - 2 VIEW COMPARISON:  06/03/2014 FINDINGS: Right basilar linear opacity. Remote median sternotomy and valvuloplasty. No pleural effusion or pneumothorax. IMPRESSION: Right basilar opacities, likely atelectasis. Electronically Signed   By: Ulyses Jarred M.D.   On: 06/23/2022 23:51    Microbiology: Recent Results (from the past 240 hour(s))  SARS Coronavirus 2 by RT PCR (hospital order, performed in [facility] hospital lab) *cepheid single result test* Anterior Nasal Swab     Status: Abnormal   Collection Time: 06/23/22 11:08 PM   Specimen: Anterior Nasal Swab  Result Value Ref Range Status   SARS Coronavirus 2 by RT PCR POSITIVE (A) NEGATIVE Final    Comment: (NOTE) SARS-CoV-2 target nucleic acids are DETECTED  SARS-CoV-2 RNA is generally detectable in upper respiratory specimens  during the acute phase of infection.  Positive results are indicative  of the presence of the identified virus, but do not rule out bacterial infection or co-infection with other pathogens not detected by the test.  Clinical correlation with patient history and  other diagnostic information is necessary to determine patient infection status.  The expected result is negative.  Fact Sheet for Patients:   https://www.patel.info/   Fact Sheet for Healthcare Providers:   https://hall.com/    This test is not yet approved or cleared by the [place] FDA and  has been authorized for detection and/or diagnosis of SARS-CoV-2 by FDA under an Emergency Use Authorization (EUA).  This EUA will remain in effect (meaning this test can be used) for the duration of  the COVID-19 declaration under Section 564(b)(1)  of the Act, 21 U.S.C. section  360-bbb-3(b)(1), unless the authorization is terminated or revoked sooner.   Performed at [facility], [phone]  [address], [place], [place] [address]      Labs: Basic Metabolic Panel: Recent Labs  Lab 06/23/22 2335 06/24/22 0121 06/24/22 2104 06/25/22 0302  NA 119* 120* 130* 127*  K 4.4 5.0 4.1 3.6  CL 88* 88* 98 97*  CO2 '24 25 25 22  '$ GLUCOSE 118* 116* 96 116*  BUN '17 16 16 18  '$ CREATININE 1.38* 1.37* 1.30* 1.15  CALCIUM 9.3 9.6 9.0 8.8*  MG  --   --  2.0  --   PHOS  --   --  2.4*  --    Liver Function Tests: Recent Labs  Lab 06/24/22 2104 06/25/22 0302  AST 33 35  ALT 26 27  ALKPHOS 63 71  BILITOT 0.8 0.5  PROT 6.6 6.7  ALBUMIN 3.7 3.8   No results for input(s): "LIPASE", "AMYLASE" in the last 168 hours. No results for input(s): "AMMONIA" in the last 168 hours. CBC: Recent Labs  Lab 06/23/22 2335 06/24/22 2104 06/25/22 0302 06/26/22 0646  WBC 7.2 4.6 5.4 3.0*  NEUTROABS 5.3 2.5  --  1.2*  HGB 11.0* 12.1* 13.0 11.0*  HCT 31.4* 35.1* 37.9* 30.8*  MCV 92.6 96.2 98.2 95.4  PLT 106* 129* 131* 119*   Cardiac Enzymes: No results for input(s): "CKTOTAL", "CKMB", "CKMBINDEX", "TROPONINI" in the last 168 hours. BNP: BNP (last 3 results) No results for input(s): "BNP" in the last 8760 hours.  ProBNP (last 3 results) No results for input(s): "PROBNP" in the last 8760 hours.  CBG: No results for input(s): "GLUCAP" in the last 168 hours.     Signed:  Dia Crawford, MD [facility]

## 2022-06-26 NOTE — Progress Notes (Signed)
At 0521, notified Travis Hayward NP that patient had a 2 sec pause per telemetry, of note patient has been sinus brady the whole night and metoprolol was not administered.

## 2022-06-26 NOTE — Progress Notes (Signed)
PROGRESS NOTE    Travis Armstrong  IOX:[id number] DOB: Aug 10, 1941 DOA: 06/24/2022 PCP: Christain Sacramento, MD     Brief Narrative:  81 y.o. WM PMHx CAD, aortic valve disease, HTN ,HLD.  Not on home O2,   Presented with  generalized fatigue Have been feeling weak Found to have Covid and sodium 120  Started on IV Fluids CXR consistent with covid  [place] and started to feel very bad he went to [facility] and was diagnosed with COVID no hx of Hyponatremia  A bit of cough Denies any fever Reports CP with cough  He has been drinking lots of water at home No ETOH no toabcco   Subjective: 7/8 afebrile overnight, A/O x4.  Negative CP, negative SOB.  Positive nausea, positive diarrhea.  Negative vomiting.  Positive increased abdominal swelling, patient states has continued to increase> 2 months.  [facility] provided him with diuretics without improvement.   Assessment & Plan: Covid vaccination;   Principal Problem:   COVID-19 virus infection Active Problems:   Hyperlipidemia   CAD (coronary artery disease)   Bicuspid aortic valve   S/P aortic valve replacement with bioprosthetic valve and CABG x   Hyponatremia   OSA (obstructive sleep apnea)   Elevated troponin   Gastroenteritis due to COVID-19 virus  Bicuspid aortic valve -Chronic sp post replacement   CAD (coronary artery disease) -Chronic stable continue aspirin 81 mg daily Lipitor 10 mg daily  S/P aortic valve replacement with bioprosthetic valve and CABG x -Chronic stable followed by cardiology  Elevated troponin  Latest Reference Range & Units 06/24/22 23:52 06/25/22 03:02 06/25/22 09:36  Troponin I (High Sensitivity) <18 ng/L 33 (H) 37 (H) 39 (H)  (H): Data is abnormally high -7/7 Echocardiogram pending.  Counseled if significant changes will consult cardiology.  Given that patient is asymptomatic and COVID-positive even if requires cardiac catheterization unlikely to have catheterization until resolution of COVID.    COVID-19 virus infection COVID-19 Labs  Recent Labs    06/24/22 2104 06/25/22 0936 06/26/22 0646  DDIMER  --  0.48 0.31  FERRITIN 191 199 190  LDH 185 182 169  CRP 5.2* 4.6*  --     Lab Results  Component Value Date   SARSCOV2NAA POSITIVE (A) 06/23/2022  Given cough advanced age will start on Molnupiravir (Lagevrio) Chest x-ray more consistent with atelectasis -7/7 not on home O2, now requiring 2 L O2 via [place] - 7/7 continuous pulse ox - Flutter valve - Incentive spirometry - DuoNeb QID - Mucinex DM BID  COVID Gastroenteritis -Patient with nausea and diarrhea x2 days. - 7/8 see COVID virus infection   HLD -Lipitor 10 mg a day -Lipid panel pending     Hyponatremia Sodium now up to 130.  Continue to monitor repeat in 6 hours or so Slowed IV fluids to 75 mils an hour to avoid rapid correction Lab Results  Component Value Date   NA 126 (L) 06/26/2022   NA 127 (L) 06/25/2022   NA 130 (L) 06/24/2022   NA 120 (L) 06/24/2022   NA 119 (LL) 06/23/2022  -7/7 asymptomatic   OSA (obstructive sleep apnea) -CPAP per respiratory  Abdominal discomfort - 7/8 increased abdominal girth>2 months treated with diuretics without improvement. -7/8 CT abdomen and pelvis pending        Mobility Assessment (last 72 hours)     Mobility Assessment     Row Name 06/25/22 2012 06/25/22 1335 06/25/22 1114 06/25/22 0715 06/24/22 18:32:53  Does patient have an order for bedrest or is patient medically unstable No - Continue assessment -- -- No - Continue assessment No - Continue assessment   What is Travis highest level of mobility based on Travis progressive mobility assessment? Level 6 (Walks independently in room and hall) - Balance while walking in room without assist - Complete Level 6 (Walks independently in room and hall) - Balance while walking in room without assist - Complete Level 6 (Walks independently in room and hall) - Balance while walking in room without assist - Complete  Level 5 (Walks with assist in room/hall) - Balance while stepping forward/back and can walk in room with assist - Complete Level 5 (Walks with assist in room/hall) - Balance while stepping forward/back and can walk in room with assist - Complete              Interdisciplinary Goals of Care Family Meeting   Date carried out: 06/26/2022  Location of Travis meeting:   Member's involved:   Durable Power of Tour manager:     Discussion: We discussed goals of care for Travis Armstrong .    Code status:   Disposition:   Time spent for Travis meeting:     Satoru Milich J, MD  06/26/2022, 12:30 PM         DVT prophylaxis: SCD Code Status: Full Family Communication:  Status is: Inpatient    Dispo: Travis patient is from: Home              Anticipated d/c is to: Home              Anticipated d/c date is: 2 days              Patient currently is not medically stable to d/c.      Consultants:    Procedures/Significant Events:    I have personally reviewed and interpreted all radiology studies and my findings are as above.  VENTILATOR SETTINGS: Nasal cannula 7/7 Flow 2 L/min SPO2 92%   Cultures   Antimicrobials: Anti-infectives (From admission, onward)    Start     Ordered Stop   06/24/22 1200  molnupiravir EUA (LAGEVRIO) capsule 800 mg        06/24/22 0608 06/29/22 0959         Devices    LINES / TUBES:      Continuous Infusions:  sodium chloride 75 mL/hr at 06/26/22 0957     Objective: Vitals:   06/25/22 1947 06/25/22 2157 06/26/22 0228 06/26/22 0930  BP: (!) 146/71 126/67 (!) 153/67   Pulse: (!) 50 (!) 51 (!) 45   Resp: '17 18 18   '$ Temp: 97.6 F (36.4 C) 98 F (36.7 C) (!) 97.5 F (36.4 C)   TempSrc:  Oral Oral   SpO2: 100% 97% 98% 96%  Weight:      Height:        Intake/Output Summary (Last 24 hours) at 06/26/2022 1230 Last data filed at 06/26/2022 0900 Gross per 24 hour  Intake 1858.67 ml  Output 100 ml   Net 1758.67 ml   Filed Weights   06/23/22 2326 06/24/22 1832  Weight: 95.3 kg 94.5 kg    Examination:  General: A/O x4, No acute respiratory distress Eyes: negative scleral hemorrhage, negative anisocoria, negative icterus ENT: Negative Runny nose, negative gingival bleeding, Neck:  Negative scars, masses, torticollis, lymphadenopathy, JVD Lungs: Clear to auscultation bilaterally without wheezes or crackles Cardiovascular: Regular rate  and rhythm without murmur gallop or rub normal S1 and S2 Abdomen: negative abdominal pain, positive significant distention,  positive firm to palpation, negative fluid wave, positive soft, bowel sounds, no rebound, no ascites, no appreciable mass Extremities: No significant cyanosis, clubbing, or edema bilateral lower extremities Skin: Negative rashes, lesions, ulcers Psychiatric:  Negative depression, negative anxiety, negative fatigue, negative mania  Central nervous system:  Cranial nerves II through XII intact, tongue/uvula midline, all extremities muscle strength 5/5, sensation intact throughout, negative dysarthria, negative expressive aphasia, negative receptive aphasia.  .     Data Reviewed: Care during Travis described time interval was provided by me .  I have reviewed this patient's available data, including medical history, events of note, physical examination, and all test results as part of my evaluation.  CBC: Recent Labs  Lab 06/23/22 2335 06/24/22 2104 06/25/22 0302 06/26/22 0646  WBC 7.2 4.6 5.4 3.0*  NEUTROABS 5.3 2.5  --  1.2*  HGB 11.0* 12.1* 13.0 11.0*  HCT 31.4* 35.1* 37.9* 30.8*  MCV 92.6 96.2 98.2 95.4  PLT 106* 129* 131* 659*   Basic Metabolic Panel: Recent Labs  Lab 06/23/22 2335 06/24/22 0121 06/24/22 2104 06/25/22 0302 06/26/22 0646  NA 119* 120* 130* 127* 126*  K 4.4 5.0 4.1 3.6 4.0  CL 88* 88* 98 97* 95*  CO2 '24 25 25 22 23  '$ GLUCOSE 118* 116* 96 116* 109*  BUN '17 16 16 18 20  '$ CREATININE 1.38* 1.37*  1.30* 1.15 1.06  CALCIUM 9.3 9.6 9.0 8.8* 8.5*  MG  --   --  2.0  --  1.8  PHOS  --   --  2.4*  --  2.9   GFR: Estimated Creatinine Clearance: 64.2 mL/min (by C-G formula based on SCr of 1.06 mg/dL). Liver Function Tests: Recent Labs  Lab 06/24/22 2104 06/25/22 0302 06/26/22 0646  AST 33 35 33  ALT '26 27 24  '$ ALKPHOS 63 71 59  BILITOT 0.8 0.5 0.5  PROT 6.6 6.7 5.9*  ALBUMIN 3.7 3.8 3.3*   No results for input(s): "LIPASE", "AMYLASE" in Travis last 168 hours. No results for input(s): "AMMONIA" in Travis last 168 hours. Coagulation Profile: No results for input(s): "INR", "PROTIME" in Travis last 168 hours. Cardiac Enzymes: No results for input(s): "CKTOTAL", "CKMB", "CKMBINDEX", "TROPONINI" in Travis last 168 hours. BNP (last 3 results) No results for input(s): "PROBNP" in Travis last 8760 hours. HbA1C: No results for input(s): "HGBA1C" in Travis last 72 hours. CBG: No results for input(s): "GLUCAP" in Travis last 168 hours. Lipid Profile: Recent Labs    06/26/22 0646  CHOL 79  HDL 29*  LDLCALC 40  TRIG 49  CHOLHDL 2.7   Thyroid Function Tests: Recent Labs    06/25/22 0302  TSH 2.412   Anemia Panel: Recent Labs    06/25/22 0936 06/26/22 0646  FERRITIN 199 190   Sepsis Labs: Recent Labs  Lab 06/24/22 2104  PROCALCITON <0.10    Recent Results (from Travis past 240 hour(s))  SARS Coronavirus 2 by RT PCR (hospital order, performed in [facility] hospital lab) *cepheid single result test* Anterior Nasal Swab     Status: Abnormal   Collection Time: 06/23/22 11:08 PM   Specimen: Anterior Nasal Swab  Result Value Ref Range Status   SARS Coronavirus 2 by RT PCR POSITIVE (A) NEGATIVE Final    Comment: (NOTE) SARS-CoV-2 target nucleic acids are DETECTED  SARS-CoV-2 RNA is generally detectable in upper respiratory specimens  during Travis acute phase  of infection.  Positive results are indicative  of Travis presence of Travis identified virus, but do not rule out bacterial infection or  co-infection with other pathogens not detected by Travis test.  Clinical correlation with patient history and  other diagnostic information is necessary to determine patient infection status.  Travis expected result is negative.  Fact Sheet for Patients:   https://www.patel.info/   Fact Sheet for Healthcare Providers:   https://hall.com/    This test is not yet approved or cleared by [place] FDA and  has been authorized for detection and/or diagnosis of SARS-CoV-2 by FDA under an Emergency Use Authorization (EUA).  This EUA will remain in effect (meaning this test can be used) for Travis duration of  Travis COVID-19 declaration under Section 564(b)(1)  of [place] Act, 21 U.S.C. section 360-bbb-3(b)(1), unless Travis authorization is terminated or revoked sooner.   Performed at [facility], [address] [place], [place] [address]          Radiology Studies: ECHOCARDIOGRAM COMPLETE  Result Date: 06/25/2022    ECHOCARDIOGRAM REPORT   Patient Name:   Travis Armstrong Date of Exam: 06/25/2022 Medical Rec #:  [id number]    Height:       71.0 in Accession #:    [id number]   Weight:       208.3 lb Date of Birth:  26-Jul-1941     BSA:          2.145 m Patient Age:    30 years     BP:           132/59 mmHg Patient Gender: M            HR:           51 bpm. Exam Location:  Inpatient Procedure: 2D Echo, Cardiac Doppler and Color Doppler Indications:    R07.9* Chest pain, unspecified  History:        Patient has prior history of Echocardiogram examinations, most                 recent 11/09/2019. CAD, Prior CABG, Aortic Valve Disease,                 Signs/Symptoms:Chest Pain; Risk Factors:Dyslipidemia and Sleep                 Apnea. Covid positive. AVR. Bicuspid AOV.                 Aortic Valve: 25 mm bioprosthetic valve is present in Travis aortic                 position. Procedure Date: 05/03/2014.  Sonographer:    Roseanna Rainbow RDCS Referring Phys:  [place]  1. Left ventricular ejection fraction, by estimation, is 65 to 70%. Travis left ventricle has normal function. Travis left ventricle has no regional wall motion abnormalities. There is mild left ventricular hypertrophy. Left ventricular diastolic parameters are indeterminate.  2. Right ventricular systolic function is normal. Travis right ventricular size is mildly enlarged. There is normal pulmonary artery systolic pressure. Travis estimated right ventricular systolic pressure is 37.1 mmHg.  3. Right atrial size was severely dilated.  4. Travis mitral valve is normal in structure. Trivial mitral valve regurgitation. No evidence of mitral stenosis.  5. There is a 25 mm bioprosthetic valve present in Travis aortic position     Aortic valve regurgitation is not visualized. MG 24mHg, increased from prior echo 11/09/19 (818mg).  Normal structure and function of prosthetic valve. Vmax 2.5 m/s, MG 13 mmHg, EOA 1.8 cm^2, DI 0.53  6. Travis inferior vena cava is dilated in size with >50% respiratory variability, suggesting right atrial pressure of 8 mmHg. FINDINGS  Left Ventricle: Left ventricular ejection fraction, by estimation, is 65 to 70%. Travis left ventricle has normal function. Travis left ventricle has no regional wall motion abnormalities. Travis left ventricular internal cavity size was normal in size. There is  mild left ventricular hypertrophy. Left ventricular diastolic parameters are indeterminate. Right Ventricle: Travis right ventricular size is mildly enlarged. Right vetricular wall thickness was not well visualized. Right ventricular systolic function is normal. There is normal pulmonary artery systolic pressure. Travis tricuspid regurgitant velocity  is 1.88 m/s, and with an assumed right atrial pressure of 8 mmHg, Travis estimated right ventricular systolic pressure is 26.8 mmHg. Left Atrium: Left atrial size was normal in size. Right Atrium: Right atrial size was severely dilated. Pericardium: There is  no evidence of pericardial effusion. Mitral Valve: Travis mitral valve is normal in structure. Trivial mitral valve regurgitation. No evidence of mitral valve stenosis. Tricuspid Valve: Travis tricuspid valve is normal in structure. Tricuspid valve regurgitation is trivial. Aortic Valve: Travis aortic valve has been repaired/replaced. Aortic valve regurgitation is not visualized. Aortic valve mean gradient measures 12.0 mmHg. Aortic valve peak gradient measures 23.7 mmHg. Aortic valve area, by VTI measures 2.11 cm. There is a  25 mm bioprosthetic valve present in Travis aortic position. Procedure Date: 05/03/2014. Pulmonic Valve: Travis pulmonic valve was not well visualized. Pulmonic valve regurgitation is not visualized. Aorta: Travis aortic root and ascending aorta are structurally normal, with no evidence of dilitation. Venous: Travis inferior vena cava is dilated in size with greater than 50% respiratory variability, suggesting right atrial pressure of 8 mmHg. IAS/Shunts: Travis interatrial septum was not well visualized.  LEFT VENTRICLE PLAX 2D LVIDd:         4.50 cm     Diastology LVIDs:         2.60 cm     LV e' medial:    4.61 cm/s LV PW:         1.20 cm     LV E/e' medial:  21.2 LV IVS:        1.10 cm     LV e' lateral:   4.72 cm/s LVOT diam:     2.30 cm     LV E/e' lateral: 20.7 LV SV:         112 LV SV Index:   52 LVOT Area:     4.15 cm  LV Volumes (MOD) LV vol d, MOD A2C: 63.6 ml LV vol d, MOD A4C: 90.7 ml LV vol s, MOD A2C: 21.9 ml LV vol s, MOD A4C: 22.2 ml LV SV MOD A2C:     41.7 ml LV SV MOD A4C:     90.7 ml LV SV MOD BP:      56.1 ml RIGHT VENTRICLE             IVC RV S prime:     11.10 cm/s  IVC diam: 2.20 cm TAPSE (M-mode): 2.0 cm LEFT ATRIUM             Index        RIGHT ATRIUM           Index LA diam:        3.90 cm 1.82 cm/m   RA Area:     34.30  cm LA Vol (A2C):   49.4 ml 23.03 ml/m  RA Volume:   128.00 ml 59.66 ml/m LA Vol (A4C):   42.4 ml 19.76 ml/m LA Biplane Vol: 46.6 ml 21.72 ml/m  AORTIC VALVE AV Area  (Vmax):    2.05 cm AV Area (Vmean):   2.06 cm AV Area (VTI):     2.11 cm AV Vmax:           243.67 cm/s AV Vmean:          155.667 cm/s AV VTI:            0.531 m AV Peak Grad:      23.7 mmHg AV Mean Grad:      12.0 mmHg LVOT Vmax:         120.50 cm/s LVOT Vmean:        77.150 cm/s LVOT VTI:          0.270 m LVOT/AV VTI ratio: 0.51  AORTA Ao Root diam: 3.20 cm Ao Asc diam:  3.60 cm MITRAL VALVE               TRICUSPID VALVE MV Area (PHT): 2.97 cm    TR Peak grad:   14.1 mmHg MV Decel Time: 255 msec    TR Vmax:        188.00 cm/s MV E velocity: 97.80 cm/s MV A velocity: 62.75 cm/s  SHUNTS MV E/A ratio:  1.56        Systemic VTI:  0.27 m                            Systemic Diam: 2.30 cm Oswaldo Milian MD Electronically signed by Oswaldo Milian MD Signature Date/Time: 06/25/2022/6:59:38 PM    Final         Scheduled Meds:  aspirin  81 mg Oral Daily   atorvastatin  10 mg Oral Daily   enoxaparin (LOVENOX) injection  40 mg Subcutaneous Q24H   isosorbide mononitrate  120 mg Oral Daily   metoprolol tartrate  12.5 mg Oral BID   molnupiravir EUA  4 capsule Oral BID   sodium chloride flush  3 mL Intravenous Q12H   Continuous Infusions:  sodium chloride 75 mL/hr at 06/26/22 0957     LOS: 2 days    Time spent:40 min    Visente Kirker, Geraldo Docker, MD [facility]   If 7PM-7AM, please contact night-coverage 06/26/2022, 12:30 PM

## 2022-06-26 NOTE — Plan of Care (Signed)
  Problem: Education: Goal: Knowledge of General Education information will improve Description: Including pain rating scale, medication(s)/side effects and non-pharmacologic comfort measures Outcome: Progressing   Problem: Health Behavior/Discharge Planning: Goal: Ability to manage health-related needs will improve Outcome: Progressing   Problem: Clinical Measurements: Goal: Ability to maintain clinical measurements within normal limits will improve Outcome: Progressing Goal: Will remain free from infection Outcome: Progressing Goal: Diagnostic test results will improve Outcome: Progressing Goal: Respiratory complications will improve Outcome: Progressing Goal: Cardiovascular complication will be avoided Outcome: Progressing   Problem: Activity: Goal: Risk for activity intolerance will decrease Outcome: Progressing   Problem: Nutrition: Goal: Adequate nutrition will be maintained Outcome: Progressing   Problem: Coping: Goal: Level of anxiety will decrease Outcome: Progressing   Problem: Elimination: Goal: Will not experience complications related to bowel motility Outcome: Progressing Goal: Will not experience complications related to urinary retention Outcome: Progressing   Problem: Pain Managment: Goal: General experience of comfort will improve Outcome: Progressing   Problem: Safety: Goal: Ability to remain free from injury will improve Outcome: Progressing   Problem: Skin Integrity: Goal: Risk for impaired skin integrity will decrease Outcome: Progressing   Problem: Education: Goal: Knowledge of General Education information will improve Description: Including pain rating scale, medication(s)/side effects and non-pharmacologic comfort measures Outcome: Progressing   Problem: Health Behavior/Discharge Planning: Goal: Ability to manage health-related needs will improve Outcome: Progressing   Problem: Clinical Measurements: Goal: Ability to maintain  clinical measurements within normal limits will improve Outcome: Progressing Goal: Will remain free from infection Outcome: Progressing Goal: Diagnostic test results will improve Outcome: Progressing Goal: Respiratory complications will improve Outcome: Progressing Goal: Cardiovascular complication will be avoided Outcome: Progressing   Problem: Activity: Goal: Risk for activity intolerance will decrease Outcome: Progressing   Problem: Nutrition: Goal: Adequate nutrition will be maintained Outcome: Progressing   Problem: Coping: Goal: Level of anxiety will decrease Outcome: Progressing   Problem: Elimination: Goal: Will not experience complications related to bowel motility Outcome: Progressing Goal: Will not experience complications related to urinary retention Outcome: Progressing   Problem: Pain Managment: Goal: General experience of comfort will improve Outcome: Progressing   Problem: Safety: Goal: Ability to remain free from injury will improve Outcome: Progressing   Problem: Skin Integrity: Goal: Risk for impaired skin integrity will decrease Outcome: Progressing   Problem: Education: Goal: Knowledge of risk factors and measures for prevention of condition will improve Outcome: Progressing   Problem: Coping: Goal: Psychosocial and spiritual needs will be supported Outcome: Progressing   Problem: Respiratory: Goal: Will maintain a patent airway Outcome: Progressing Goal: Complications related to the disease process, condition or treatment will be avoided or minimized Outcome: Progressing

## 2022-06-26 NOTE — Plan of Care (Signed)
  Problem: Clinical Measurements: Goal: Respiratory complications will improve Outcome: Not Progressing   Problem: Activity: Goal: Risk for activity intolerance will decrease Outcome: Not Progressing   Problem: Safety: Goal: Ability to remain free from injury will improve Outcome: Not Progressing

## 2022-06-27 DIAGNOSIS — A0839 Other viral enteritis: Secondary | ICD-10-CM

## 2022-06-27 DIAGNOSIS — U071 COVID-19: Secondary | ICD-10-CM | POA: Diagnosis not present

## 2022-06-27 DIAGNOSIS — R778 Other specified abnormalities of plasma proteins: Secondary | ICD-10-CM | POA: Diagnosis not present

## 2022-06-27 DIAGNOSIS — I2510 Atherosclerotic heart disease of native coronary artery without angina pectoris: Secondary | ICD-10-CM | POA: Diagnosis not present

## 2022-06-27 DIAGNOSIS — Q231 Congenital insufficiency of aortic valve: Secondary | ICD-10-CM | POA: Diagnosis not present

## 2022-06-27 LAB — CBC WITH DIFFERENTIAL/PLATELET
Abs Immature Granulocytes: 0.01 10*3/uL (ref 0.00–0.07)
Basophils Absolute: 0 10*3/uL (ref 0.0–0.1)
Basophils Relative: 0 %
Eosinophils Absolute: 0 10*3/uL (ref 0.0–0.5)
Eosinophils Relative: 0 %
HCT: 35 % — ABNORMAL LOW (ref 39.0–52.0)
Hemoglobin: 12.4 g/dL — ABNORMAL LOW (ref 13.0–17.0)
Immature Granulocytes: 0 %
Lymphocytes Relative: 18 %
Lymphs Abs: 1.1 10*3/uL (ref 0.7–4.0)
MCH: 34 pg (ref 26.0–34.0)
MCHC: 35.4 g/dL (ref 30.0–36.0)
MCV: 95.9 fL (ref 80.0–100.0)
Monocytes Absolute: 0.6 10*3/uL (ref 0.1–1.0)
Monocytes Relative: 10 %
Neutro Abs: 4.5 10*3/uL (ref 1.7–7.7)
Neutrophils Relative %: 72 %
Platelets: 110 10*3/uL — ABNORMAL LOW (ref 150–400)
RBC: 3.65 MIL/uL — ABNORMAL LOW (ref 4.22–5.81)
RDW: 12.8 % (ref 11.5–15.5)
WBC: 6.3 10*3/uL (ref 4.0–10.5)
nRBC: 0 % (ref 0.0–0.2)

## 2022-06-27 LAB — MAGNESIUM: Magnesium: 1.9 mg/dL (ref 1.7–2.4)

## 2022-06-27 LAB — COMPREHENSIVE METABOLIC PANEL
ALT: 25 U/L (ref 0–44)
AST: 36 U/L (ref 15–41)
Albumin: 3.8 g/dL (ref 3.5–5.0)
Alkaline Phosphatase: 63 U/L (ref 38–126)
Anion gap: 7 (ref 5–15)
BUN: 14 mg/dL (ref 8–23)
CO2: 27 mmol/L (ref 22–32)
Calcium: 9.1 mg/dL (ref 8.9–10.3)
Chloride: 101 mmol/L (ref 98–111)
Creatinine, Ser: 1.21 mg/dL (ref 0.61–1.24)
GFR, Estimated: >60 mL/min (ref >60)
Glucose, Bld: 102 mg/dL — ABNORMAL HIGH (ref 70–99)
Potassium: 4.4 mmol/L (ref 3.5–5.1)
Sodium: 135 mmol/L (ref 135–145)
Total Bilirubin: 0.6 mg/dL (ref 0.3–1.2)
Total Protein: 6.4 g/dL — ABNORMAL LOW (ref 6.5–8.1)

## 2022-06-27 LAB — FERRITIN: Ferritin: 264 ng/mL (ref 24–336)

## 2022-06-27 LAB — PHOSPHORUS: Phosphorus: 3 mg/dL (ref 2.5–4.6)

## 2022-06-27 LAB — D-DIMER, QUANTITATIVE: D-Dimer, Quant: 0.48 ug/mL-FEU (ref 0.00–0.50)

## 2022-06-27 LAB — LACTATE DEHYDROGENASE: LDH: 198 U/L — ABNORMAL HIGH (ref 98–192)

## 2022-06-27 LAB — C-REACTIVE PROTEIN: CRP: 1.3 mg/dL — ABNORMAL HIGH (ref <1.0)

## 2022-06-27 MED ORDER — LISINOPRIL 10 MG PO TABS
10.0000 mg | ORAL_TABLET | Freq: Every day | ORAL | Status: DC
  Administered 2022-06-27 – 2022-06-28 (×2): 10 mg via ORAL
  Filled 2022-06-27 (×2): qty 1

## 2022-06-27 MED ORDER — ORAL CARE MOUTH RINSE: 15.0000 mL | OROMUCOSAL | Status: DC | PRN

## 2022-06-27 NOTE — Progress Notes (Signed)
Patient does not want to wear CPAP tonight. 

## 2022-06-27 NOTE — Progress Notes (Signed)
PROGRESS NOTE    Travis Armstrong  ZOX:[id number] DOB: [birth year] DOA: 06/24/2022 PCP: Travis Sacramento, MD     Brief Narrative:  81 y.o. WM PMHx CAD, aortic valve disease, HTN ,HLD.  Not on home O2,   Presented with  generalized fatigue Have been feeling weak Found to have Covid and sodium 120  Started on IV Fluids CXR consistent with covid  He was at the beach and started to feel very bad he went to [name] and was diagnosed with COVID no hx of Hyponatremia  A bit of cough Denies any fever Reports CP with cough  He has been drinking lots of water at home No ETOH no toabcco   Subjective: 7/9 A/O x4, more comfortable today.  Negative CP, negative SOB, negative nausea.  Negative diarrhea.  Patient believes that he only has 1 more dose of antiviral.    Assessment & Plan: Covid vaccination;   Principal Problem:   COVID-19 virus infection Active Problems:   Hyperlipidemia   CAD (coronary artery disease)   Bicuspid aortic valve   S/P aortic valve replacement with bioprosthetic valve and CABG x   Hyponatremia   OSA (obstructive sleep apnea)   Elevated troponin   Gastroenteritis due to COVID-19 virus  Essential HTN - Chlorthalidone 25 mg daily (hold) hyponatremia -Imdur 120 mg daily - 7/9 lisinopril 10 mg daily (home dose 20 mg) -Metoprolol 12.5 mg BID (hold) bradycardia  Bicuspid aortic valve -Chronic sp post replacement   CAD (coronary artery disease) -Chronic stable continue aspirin 81 mg daily Lipitor 10 mg daily  S/P aortic valve replacement with bioprosthetic valve and CABG x -Chronic stable followed by cardiology  Elevated troponin  Latest Reference Range & Units 06/24/22 23:52 06/25/22 03:02 06/25/22 09:36  Troponin I (High Sensitivity) <18 ng/L 33 (H) 37 (H) 39 (H)  (H): Data is abnormally high -7/7 Echocardiogram pending.  Counseled if significant changes will consult cardiology.  Given that patient is asymptomatic and COVID-positive even if requires  cardiac catheterization unlikely to have catheterization until resolution of COVID.   COVID-19 virus infection COVID-19 Labs  Recent Labs    06/25/22 0936 06/26/22 0646 06/27/22 0558  DDIMER 0.48 0.31 0.48  FERRITIN 199 190 264  LDH 182 169 198*  CRP 4.6* 2.3* 1.3*     Lab Results  Component Value Date   SARSCOV2NAA POSITIVE (A) 06/23/2022  Given cough advanced age will start on Molnupiravir ([place]) Chest x-ray more consistent with atelectasis -7/7 not on home O2, now requiring 2 L O2 via [name] - 7/7 continuous pulse ox - Flutter valve - Incentive spirometry - DuoNeb QID - Mucinex DM BID -7/9 confirmed with pharmacy that patient does have 2 more doses of Molnupiravir (Lagevrio)  COVID Gastroenteritis -Patient with nausea and diarrhea x2 days. - 7/8 see COVID virus infection   HLD -Lipitor 10 mg a day -Lipid panel pending     Hyponatremia Sodium now up to 130.  Continue to monitor repeat in 6 hours or so Slowed IV fluids to 75 mils an hour to avoid rapid correction Lab Results  Component Value Date   NA 135 06/27/2022   NA 126 (L) 06/26/2022   NA 127 (L) 06/25/2022   NA 130 (L) 06/24/2022   NA 120 (L) 06/24/2022  -7/7 asymptomatic -7/9 resolved   OSA (obstructive sleep apnea) -CPAP per respiratory  Abdominal discomfort - 7/8 increased abdominal girth>2 months treated with diuretics without improvement. -7/8 CT abdomen and pelvis pending  Mobility Assessment (last 72 hours)     Mobility Assessment     Row Name 06/25/22 2012 06/25/22 1335 06/25/22 1114 06/25/22 0715 06/24/22 18:32:53   Does patient have an order for bedrest or is patient medically unstable No - Continue assessment -- -- No - Continue assessment No - Continue assessment   What is the highest level of mobility based on the progressive mobility assessment? Level 6 (Walks independently in room and hall) - Balance while walking in room without assist - Complete Level 6 (Walks  independently in room and hall) - Balance while walking in room without assist - Complete Level 6 (Walks independently in room and hall) - Balance while walking in room without assist - Complete Level 5 (Walks with assist in room/hall) - Balance while stepping forward/back and can walk in room with assist - Complete Level 5 (Walks with assist in room/hall) - Balance while stepping forward/back and can walk in room with assist - Complete              Interdisciplinary Goals of Care Family Meeting   Date carried out: 06/27/2022  Location of the meeting:   Member's involved:   Durable Power of Tour manager:     Discussion: We discussed goals of care for [name] .    Code status:   Disposition:   Time spent for the meeting:     Travis Armstrong J, MD  06/27/2022, 3:31 PM         DVT prophylaxis: SCD Code Status: Full Family Communication:  Status is: Inpatient    Dispo: The patient is from: Home              Anticipated d/c is to: Home              Anticipated d/c date is: 2 days              Patient currently is not medically stable to d/c.      Consultants:    Procedures/Significant Events:    I have personally reviewed and interpreted all radiology studies and my findings are as above.  VENTILATOR SETTINGS: Room air 7/9 SPO2 98%   Cultures   Antimicrobials: Anti-infectives (From admission, onward)    Start     Ordered Stop   06/24/22 1200  molnupiravir EUA (LAGEVRIO) capsule 800 mg        06/24/22 0608 06/29/22 0959         Devices    LINES / TUBES:      Continuous Infusions:  sodium chloride 75 mL/hr at 06/27/22 1052     Objective: Vitals:   06/26/22 2118 06/26/22 2121 06/26/22 2227 06/27/22 0542  BP: (!) 167/73 (!) 161/70 (!) 137/52 (!) 147/70  Pulse: (!) 56 (!) 55 (!) 54 (!) 59  Resp: '16  17 18  '$ Temp: 97.7 F (36.5 C)  97.7 F (36.5 C) 98.2 F (36.8 C)  TempSrc: Oral  Axillary Oral   SpO2: 98% 96% 97% 98%  Weight:      Height:        Intake/Output Summary (Last 24 hours) at 06/27/2022 1531 Last data filed at 06/27/2022 0542 Gross per 24 hour  Intake 120 ml  Output --  Net 120 ml    Filed Weights   06/23/22 2326 06/24/22 1832  Weight: 95.3 kg 94.5 kg    Examination:  General: A/O x4, No acute respiratory distress Eyes: negative scleral hemorrhage, negative anisocoria, negative  icterus ENT: Negative Runny nose, negative gingival bleeding, Neck:  Negative scars, masses, torticollis, lymphadenopathy, JVD Lungs: Clear to auscultation bilaterally without wheezes or crackles Cardiovascular: Regular rate and rhythm without murmur gallop or rub normal S1 and S2 Abdomen: negative abdominal pain, positive significant distention,  positive firm to palpation, negative fluid wave, positive soft, bowel sounds, no rebound, no ascites, no appreciable mass Extremities: No significant cyanosis, clubbing, or edema bilateral lower extremities Skin: Negative rashes, lesions, ulcers Psychiatric:  Negative depression, negative anxiety, negative fatigue, negative mania  Central nervous system:  Cranial nerves II through XII intact, tongue/uvula midline, all extremities muscle strength 5/5, sensation intact throughout, negative dysarthria, negative expressive aphasia, negative receptive aphasia.  .     Data Reviewed: Care during the described time interval was provided by me .  I have reviewed this patient's available data, including medical history, events of note, physical examination, and all test results as part of my evaluation.  CBC: Recent Labs  Lab 06/23/22 2335 06/24/22 2104 06/25/22 0302 06/26/22 0646 06/27/22 0558  WBC 7.2 4.6 5.4 3.0* 6.3  NEUTROABS 5.3 2.5  --  1.2* 4.5  HGB 11.0* 12.1* 13.0 11.0* 12.4*  HCT 31.4* 35.1* 37.9* 30.8* 35.0*  MCV 92.6 96.2 98.2 95.4 95.9  PLT 106* 129* 131* 119* 110*    Basic Metabolic Panel: Recent Labs  Lab 06/24/22 0121  06/24/22 2104 06/25/22 0302 06/26/22 0646 06/27/22 0558  NA 120* 130* 127* 126* 135  K 5.0 4.1 3.6 4.0 4.4  CL 88* 98 97* 95* 101  CO2 '25 25 22 23 27  '$ GLUCOSE 116* 96 116* 109* 102*  BUN '16 16 18 20 14  '$ CREATININE 1.37* 1.30* 1.15 1.06 1.21  CALCIUM 9.6 9.0 8.8* 8.5* 9.1  MG  --  2.0  --  1.8 1.9  PHOS  --  2.4*  --  2.9 3.0    GFR: Estimated Creatinine Clearance: 56.2 mL/min (by C-G formula based on SCr of 1.21 mg/dL). Liver Function Tests: Recent Labs  Lab 06/24/22 2104 06/25/22 0302 06/26/22 0646 06/27/22 0558  AST 33 35 33 36  ALT '26 27 24 25  '$ ALKPHOS 63 71 59 63  BILITOT 0.8 0.5 0.5 0.6  PROT 6.6 6.7 5.9* 6.4*  ALBUMIN 3.7 3.8 3.3* 3.8    No results for input(s): "LIPASE", "AMYLASE" in the last 168 hours. No results for input(s): "AMMONIA" in the last 168 hours. Coagulation Profile: No results for input(s): "INR", "PROTIME" in the last 168 hours. Cardiac Enzymes: No results for input(s): "CKTOTAL", "CKMB", "CKMBINDEX", "TROPONINI" in the last 168 hours. BNP (last 3 results) No results for input(s): "PROBNP" in the last 8760 hours. HbA1C: No results for input(s): "HGBA1C" in the last 72 hours. CBG: No results for input(s): "GLUCAP" in the last 168 hours. Lipid Profile: Recent Labs    06/26/22 0646  CHOL 79  HDL 29*  LDLCALC 40  TRIG 49  CHOLHDL 2.7    Thyroid Function Tests: Recent Labs    06/25/22 0302  TSH 2.412    Anemia Panel: Recent Labs    06/26/22 0646 06/27/22 0558  FERRITIN 190 264    Sepsis Labs: Recent Labs  Lab 06/24/22 2104  PROCALCITON <0.10     Recent Results (from the past 240 hour(s))  SARS Coronavirus 2 by RT PCR (hospital order, performed in [facility] hospital lab) *cepheid single result test* Anterior Nasal Swab     Status: Abnormal   Collection Time: 06/23/22 11:08 PM   Specimen:  Anterior Nasal Swab  Result Value Ref Range Status   SARS Coronavirus 2 by RT PCR POSITIVE (A) NEGATIVE Final    Comment:  (NOTE) SARS-CoV-2 target nucleic acids are DETECTED  SARS-CoV-2 RNA is generally detectable in upper respiratory specimens  during the acute phase of infection.  Positive results are indicative  of the presence of the identified virus, but do not rule out bacterial infection or co-infection with other pathogens not detected by the test.  Clinical correlation with patient history and  other diagnostic information is necessary to determine patient infection status.  The expected result is negative.  Fact Sheet for Patients:   https://www.patel.info/   Fact Sheet for Healthcare Providers:   https://hall.com/    This test is not yet approved or cleared by the [place] FDA and  has been authorized for detection and/or diagnosis of SARS-CoV-2 by FDA under an Emergency Use Authorization (EUA).  This EUA will remain in effect (meaning this test can be used) for the duration of  the COVID-19 declaration under Section 564(b)(1)  of the Act, 21 U.S.C. section 360-bbb-3(b)(1), unless the authorization is terminated or revoked sooner.   Performed at [facility], [address] [place], [place] [address]          Radiology Studies: CT ABDOMEN PELVIS W CONTRAST  Result Date: 06/26/2022 CLINICAL DATA:  Evaluate for neoplasm versus ascites.  COVID-19. EXAM: CT ABDOMEN AND PELVIS WITH CONTRAST TECHNIQUE: Multidetector CT imaging of the abdomen and pelvis was performed using the standard protocol following bolus administration of intravenous contrast. RADIATION DOSE REDUCTION: This exam was performed according to the departmental dose-optimization program which includes automated exposure control, adjustment of the mA and/or kV according to patient size and/or use of iterative reconstruction technique. CONTRAST:  113m OMNIPAQUE IOHEXOL 300 MG/ML  SOLN COMPARISON:  CT abdomen 04/13/2004 FINDINGS: Lower chest: There is atelectasis  in the lung bases. There is mild elevation of the left hemidiaphragm. Hepatobiliary: No focal liver abnormality is seen. No gallstones, gallbladder wall thickening, or biliary dilatation. Pancreas: Unremarkable. No pancreatic ductal dilatation or surrounding inflammatory changes. Spleen: Normal in size without focal abnormality. Adrenals/Urinary Tract: There is a 6.2 cm left renal cyst which has increased in size. There is a 14 mm hypodense area in the right kidney which has decreased in size, favored as a small complex cyst. Otherwise, the kidneys and adrenal glands are within normal limits. There is mild diffuse bladder wall thickening. Stomach/Bowel: Stomach is within normal limits. Appendix appears normal. No evidence of bowel wall thickening, distention, or inflammatory changes. There is sigmoid colon diverticulosis without evidence for acute diverticulitis. Duodenal diverticula are present. Vascular/Lymphatic: Aortic atherosclerosis. No enlarged abdominal or pelvic lymph nodes. Reproductive: Prostate gland is mildly enlarged. Other: Small fat containing left inguinal hernia. There is no ascites. There is some mild inflammatory stranding and fluid along the right retroperitoneum in the right lower quadrant image 22/77of uncertain etiology. Musculoskeletal: Subcutaneous injection sites are noted in the lateral aspects of the lower abdomen. Sternotomy wires are present. There are bilateral pars interarticularis defects at L5. There is 17 mm of anterolisthesis at L5-S1. Multilevel degenerative changes affect the spine. No focal osseous lesions are seen. Thoracic spinal cord stimulator device is partially visualized. IMPRESSION: 1. Mild nonspecific stranding and edema in the lower right retroperitoneum of uncertain etiology. There is no focal mass lesion, ascites or lymphadenopathy. 2. Mild bladder wall thickening concerning for cystitis. 3. Sigmoid colon diverticulosis. 4. Likely small complex cyst in  the right  kidney. This can be further evaluated with ultrasound. 5. 6.2 cm left renal cyst. 6.  Aortic Atherosclerosis (ICD10-I70.0). Electronically Signed   By: Ronney Asters M.D.   On: 06/26/2022 16:13        Scheduled Meds:  aspirin  81 mg Oral Daily   atorvastatin  10 mg Oral Daily   enoxaparin (LOVENOX) injection  40 mg Subcutaneous Q24H   isosorbide mononitrate  120 mg Oral Daily   metoprolol tartrate  12.5 mg Oral BID   molnupiravir EUA  4 capsule Oral BID   sodium chloride flush  3 mL Intravenous Q12H   Continuous Infusions:  sodium chloride 75 mL/hr at 06/27/22 1052     LOS: 3 days    Time spent:40 min    Francoise Chojnowski, Geraldo Docker, MD [facility]   If 7PM-7AM, please contact night-coverage 06/27/2022, 3:31 PM

## 2022-06-28 LAB — MAGNESIUM: Magnesium: 1.9 mg/dL (ref 1.7–2.4)

## 2022-06-28 LAB — COMPREHENSIVE METABOLIC PANEL
ALT: 21 U/L (ref 0–44)
AST: 28 U/L (ref 15–41)
Albumin: 3.4 g/dL — ABNORMAL LOW (ref 3.5–5.0)
Alkaline Phosphatase: 56 U/L (ref 38–126)
Anion gap: 5 (ref 5–15)
BUN: 14 mg/dL (ref 8–23)
CO2: 23 mmol/L (ref 22–32)
Calcium: 8.8 mg/dL — ABNORMAL LOW (ref 8.9–10.3)
Chloride: 105 mmol/L (ref 98–111)
Creatinine, Ser: 1.12 mg/dL (ref 0.61–1.24)
GFR, Estimated: >60 mL/min (ref >60)
Glucose, Bld: 103 mg/dL — ABNORMAL HIGH (ref 70–99)
Potassium: 4 mmol/L (ref 3.5–5.1)
Sodium: 133 mmol/L — ABNORMAL LOW (ref 135–145)
Total Bilirubin: 0.8 mg/dL (ref 0.3–1.2)
Total Protein: 5.8 g/dL — ABNORMAL LOW (ref 6.5–8.1)

## 2022-06-28 LAB — C-REACTIVE PROTEIN: CRP: 3.4 mg/dL — ABNORMAL HIGH (ref <1.0)

## 2022-06-28 LAB — CBC WITH DIFFERENTIAL/PLATELET
Abs Immature Granulocytes: 0.01 10*3/uL (ref 0.00–0.07)
Basophils Absolute: 0 10*3/uL (ref 0.0–0.1)
Basophils Relative: 0 %
Eosinophils Absolute: 0 10*3/uL (ref 0.0–0.5)
Eosinophils Relative: 1 %
HCT: 32.7 % — ABNORMAL LOW (ref 39.0–52.0)
Hemoglobin: 11.1 g/dL — ABNORMAL LOW (ref 13.0–17.0)
Immature Granulocytes: 0 %
Lymphocytes Relative: 27 %
Lymphs Abs: 1.2 10*3/uL (ref 0.7–4.0)
MCH: 33.8 pg (ref 26.0–34.0)
MCHC: 33.9 g/dL (ref 30.0–36.0)
MCV: 99.7 fL (ref 80.0–100.0)
Monocytes Absolute: 0.6 10*3/uL (ref 0.1–1.0)
Monocytes Relative: 14 %
Neutro Abs: 2.5 10*3/uL (ref 1.7–7.7)
Neutrophils Relative %: 58 %
Platelets: 124 10*3/uL — ABNORMAL LOW (ref 150–400)
RBC: 3.28 MIL/uL — ABNORMAL LOW (ref 4.22–5.81)
RDW: 13.1 % (ref 11.5–15.5)
WBC: 4.3 10*3/uL (ref 4.0–10.5)
nRBC: 0 % (ref 0.0–0.2)

## 2022-06-28 LAB — FERRITIN: Ferritin: 256 ng/mL (ref 24–336)

## 2022-06-28 LAB — LACTATE DEHYDROGENASE: LDH: 172 U/L (ref 98–192)

## 2022-06-28 LAB — PHOSPHORUS: Phosphorus: 2.7 mg/dL (ref 2.5–4.6)

## 2022-06-28 LAB — D-DIMER, QUANTITATIVE: D-Dimer, Quant: 0.5 ug/mL-FEU (ref 0.00–0.50)

## 2022-06-28 MED ORDER — ONDANSETRON HCL 4 MG PO TABS: 4.0000 mg | ORAL_TABLET | Freq: Four times a day (QID) | ORAL | 0 refills | Status: AC | PRN

## 2022-06-28 MED ORDER — MOLNUPIRAVIR EUA 200MG CAPSULE: 4.0000 | ORAL_CAPSULE | Freq: Two times a day (BID) | ORAL | 0 refills | Status: AC

## 2022-06-28 MED ORDER — GUAIFENESIN-DM 100-10 MG/5ML PO SYRP: 10.0000 mL | ORAL_SOLUTION | ORAL | 0 refills | Status: AC | PRN

## 2022-06-28 NOTE — Progress Notes (Signed)
Patient will be discharging today. Belongings were returned. Education on medications was provided.

## 2022-06-28 NOTE — Progress Notes (Signed)
CCMD called the patient  is SB in the 50s in 1st degree HB . He has nonconductive PAC and had a pause of 2.1 seconds. He was asymptomatic when I checked on him. Notified Dr. Myna Hidalgo without any new order. Will continue to monitor.

## 2024-02-05 ENCOUNTER — Encounter (HOSPITAL_COMMUNITY): Payer: Self-pay | Admitting: Emergency Medicine

## 2024-02-05 ENCOUNTER — Emergency Department (HOSPITAL_COMMUNITY)
Admission: EM | Admit: 2024-02-05 | Discharge: 2024-02-05 | Disposition: A | Discharge status: Home / Self Care | Payer: No Typology Code available for payment source | Attending: Emergency Medicine | Admitting: Emergency Medicine

## 2024-02-05 ENCOUNTER — Emergency Department (HOSPITAL_COMMUNITY): Payer: No Typology Code available for payment source

## 2024-02-05 ENCOUNTER — Other Ambulatory Visit: Payer: Self-pay

## 2024-02-05 DIAGNOSIS — I2510 Atherosclerotic heart disease of native coronary artery without angina pectoris: Secondary | ICD-10-CM | POA: Diagnosis not present

## 2024-02-05 DIAGNOSIS — E860 Dehydration: Secondary | ICD-10-CM | POA: Diagnosis not present

## 2024-02-05 DIAGNOSIS — I10 Essential (primary) hypertension: Secondary | ICD-10-CM | POA: Diagnosis not present

## 2024-02-05 DIAGNOSIS — Z79899 Other long term (current) drug therapy: Secondary | ICD-10-CM | POA: Insufficient documentation

## 2024-02-05 DIAGNOSIS — Z7982 Long term (current) use of aspirin: Secondary | ICD-10-CM | POA: Insufficient documentation

## 2024-02-05 DIAGNOSIS — R55 Syncope and collapse: Secondary | ICD-10-CM | POA: Diagnosis present

## 2024-02-05 LAB — URINALYSIS, ROUTINE W REFLEX MICROSCOPIC
Bilirubin Urine: NEGATIVE
Glucose, UA: NEGATIVE mg/dL
Hgb urine dipstick: NEGATIVE
Ketones, ur: NEGATIVE mg/dL
Leukocytes,Ua: NEGATIVE
Nitrite: NEGATIVE
Protein, ur: NEGATIVE mg/dL
Specific Gravity, Urine: 1.009 (ref 1.005–1.030)
pH: 6 (ref 5.0–8.0)

## 2024-02-05 LAB — CBC
HCT: 39.4 % (ref 39.0–52.0)
Hemoglobin: 13.5 g/dL (ref 13.0–17.0)
MCH: 33.4 pg (ref 26.0–34.0)
MCHC: 34.3 g/dL (ref 30.0–36.0)
MCV: 97.5 fL (ref 80.0–100.0)
Platelets: 162 10*3/uL (ref 150–400)
RBC: 4.04 MIL/uL — ABNORMAL LOW (ref 4.22–5.81)
RDW: 13.2 % (ref 11.5–15.5)
WBC: 9.4 10*3/uL (ref 4.0–10.5)
nRBC: 0 % (ref 0.0–0.2)

## 2024-02-05 LAB — HEPATIC FUNCTION PANEL
ALT: 18 U/L (ref 0–44)
AST: 24 U/L (ref 15–41)
Albumin: 4.1 g/dL (ref 3.5–5.0)
Alkaline Phosphatase: 56 U/L (ref 38–126)
Bilirubin, Direct: 0.1 mg/dL (ref 0.0–0.2)
Indirect Bilirubin: 0.8 mg/dL (ref 0.3–0.9)
Total Bilirubin: 0.9 mg/dL (ref 0.0–1.2)
Total Protein: 6.7 g/dL (ref 6.5–8.1)

## 2024-02-05 LAB — BASIC METABOLIC PANEL
Anion gap: 10 (ref 5–15)
BUN: 19 mg/dL (ref 8–23)
CO2: 24 mmol/L (ref 22–32)
Calcium: 9.3 mg/dL (ref 8.9–10.3)
Chloride: 105 mmol/L (ref 98–111)
Creatinine, Ser: 1.39 mg/dL — ABNORMAL HIGH (ref 0.61–1.24)
GFR, Estimated: 51 mL/min — ABNORMAL LOW (ref >60)
Glucose, Bld: 132 mg/dL — ABNORMAL HIGH (ref 70–99)
Potassium: 4 mmol/L (ref 3.5–5.1)
Sodium: 139 mmol/L (ref 135–145)

## 2024-02-05 LAB — DIFFERENTIAL
Abs Immature Granulocytes: 0.03 10*3/uL (ref 0.00–0.07)
Basophils Absolute: 0 10*3/uL (ref 0.0–0.1)
Basophils Relative: 0 %
Eosinophils Absolute: 0.1 10*3/uL (ref 0.0–0.5)
Eosinophils Relative: 1 %
Immature Granulocytes: 0 %
Lymphocytes Relative: 11 %
Lymphs Abs: 1 10*3/uL (ref 0.7–4.0)
Monocytes Absolute: 0.9 10*3/uL (ref 0.1–1.0)
Monocytes Relative: 10 %
Neutro Abs: 7.3 10*3/uL (ref 1.7–7.7)
Neutrophils Relative %: 78 %

## 2024-02-05 LAB — RESP PANEL BY RT-PCR (RSV, FLU A&B, COVID)  RVPGX2
Influenza A by PCR: NEGATIVE
Influenza B by PCR: NEGATIVE
Resp Syncytial Virus by PCR: NEGATIVE
SARS Coronavirus 2 by RT PCR: NEGATIVE

## 2024-02-05 LAB — TROPONIN I (HIGH SENSITIVITY)
Troponin I (High Sensitivity): 6 ng/L (ref <18)
Troponin I (High Sensitivity): 7 ng/L (ref <18)

## 2024-02-05 LAB — CBG MONITORING, ED: Glucose-Capillary: 108 mg/dL — ABNORMAL HIGH (ref 70–99)

## 2024-02-05 MED ORDER — SODIUM CHLORIDE 0.9 % IV BOLUS
500.0000 mL | Freq: Once | INTRAVENOUS | Status: AC
  Administered 2024-02-05: 500 mL via INTRAVENOUS

## 2024-02-05 NOTE — ED Provider Notes (Signed)
Travis Armstrong EMERGENCY DEPARTMENT AT [facility] Provider Note   CSN: [id number] Arrival date & time: 02/05/24  1039     History {Add pertinent medical, surgical, social history, OB history to HPI:1} Chief Complaint  Patient presents with   Loss of Consciousness    Travis Armstrong is a 83 y.o. male.  Patient has a history of hypertension and coronary artery disease.  Patient has a history of coronary artery bypass surgery.  Recently he had a cardiac cath that he stated looked good.  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  Patient feels fine now   Loss of Consciousness      Home Medications Prior to Admission medications   Medication Sig Start Date End Date Taking? Authorizing Provider  ACIDOPHILUS LACTOBACILLUS PO Take 1 tablet by mouth 2 (two) times daily.    [provider]  aspirin 81 MG tablet Take 81 mg by mouth daily.     [provider]  atorvastatin (LIPITOR) 10 MG tablet Take 10 mg by mouth daily.    [provider]  gabapentin (NEURONTIN) 300 MG capsule Take 300 mg by mouth 3 (three) times daily.    [provider]  guaiFENesin-dextromethorphan (ROBITUSSIN DM) 100-10 MG/5ML syrup Take 10 mLs by mouth every 4 (four) hours as needed for cough. 06/28/22   Drema Dallas, MD  isosorbide mononitrate (IMDUR) 120 MG 24 hr tablet Take 120 mg by mouth daily.    [provider]  lisinopril (ZESTRIL) 20 MG tablet Take 20 mg by mouth daily.    [provider]  magnesium gluconate (MAGONATE) 500 MG tablet Take 500 mg by mouth daily.     [provider]  Multiple Vitamins-Minerals (PRESERVISION AREDS 2) CAPS Take 2 capsules by mouth daily.    [provider]  nitroGLYCERIN (NITROSTAT) 0.4 MG SL tablet Place 1 tablet (0.4 mg total) under the tongue every 5 (five) minutes as needed for chest pain. Patient not taking: Reported on 06/24/2022 09/23/14   Laurey Morale, MD  ondansetron  (ZOFRAN) 4 MG tablet Take 1 tablet (4 mg total) by mouth every 6 (six) hours as needed for nausea. 06/28/22   Drema Dallas, MD  vitamin B-12 (CYANOCOBALAMIN) 500 MCG tablet Take 1,000 mcg by mouth daily.    [provider]      Allergies    Ranexa [ranolazine], Penicillins, and Other    Review of Systems   Review of Systems  Cardiovascular:  Positive for syncope.    Physical Exam Updated Vital Signs BP 134/69   Pulse (!) 56   Temp 97.6 F (36.4 C) (Axillary)   Resp 15   Ht 5\' 11"  (1.803 m)   Wt 95 kg   SpO2 97%   BMI 29.21 kg/m  Physical Exam  ED Results / Procedures / Treatments   Labs (all labs ordered are listed, but only abnormal results are displayed) Labs Reviewed  BASIC METABOLIC PANEL - Abnormal; Notable for the following components:      Result Value   Glucose, Bld 132 (*)    Creatinine, Ser 1.39 (*)    GFR, Estimated 51 (*)    All other components within normal limits  CBC - Abnormal; Notable for the following components:   RBC 4.04 (*)    All other components within normal limits  URINALYSIS, ROUTINE W REFLEX MICROSCOPIC - Abnormal; Notable for the following components:   Color, Urine STRAW (*)  All other components within normal limits  CBG MONITORING, ED - Abnormal; Notable for the following components:   Glucose-Capillary 108 (*)    All other components within normal limits  RESP PANEL BY RT-PCR (RSV, FLU A&B, COVID)  [id number]  HEPATIC FUNCTION PANEL  DIFFERENTIAL  TROPONIN I (HIGH SENSITIVITY)  TROPONIN I (HIGH SENSITIVITY)    EKG EKG Interpretation Date/Time:  Sunday February 05 2024 10:48:49 EST Ventricular Rate:  50 PR Interval:  185 QRS Duration:  152 QT Interval:  506 QTC Calculation: 462 R Axis:   -6  Text Interpretation: Sinus rhythm Right bundle branch block Confirmed by Bethann Berkshire [phone]) on 02/05/2024 2:47:16 PM  Radiology CT Head Wo Contrast Result Date: 02/05/2024 CLINICAL DATA:  Headache with neuro deficit  EXAM: CT HEAD WITHOUT CONTRAST TECHNIQUE: Contiguous axial images were obtained from the base of the skull through the vertex without intravenous contrast. RADIATION DOSE REDUCTION: This exam was performed according to the departmental dose-optimization program which includes automated exposure control, adjustment of the mA and/or kV according to patient size and/or use of iterative reconstruction technique. COMPARISON:  None Available. FINDINGS: Brain: No evidence of acute infarction, hemorrhage, hydrocephalus, extra-axial collection or mass lesion/mass effect. Generalized atrophy Vascular: No hyperdense vessel or unexpected calcification. Skull: Normal. Negative for fracture or focal lesion. Sinuses/Orbits: No acute finding. IMPRESSION: No acute or reversible finding. Electronically Signed   By: Tiburcio Pea M.D.   On: 02/05/2024 12:11    Procedures Procedures  {Document cardiac monitor, telemetry assessment procedure when appropriate:1}  Medications Ordered in ED Medications  sodium chloride 0.9 % bolus 500 mL (0 mLs Intravenous Stopped 02/05/24 1242)    ED Course/ Medical Decision Making/ A&P   {   Click here for ABCD2, HEART and other calculatorsREFRESH Note before signing :1}                              Medical Decision Making Amount and/or Complexity of Data Reviewed Labs: ordered. Radiology: ordered.   Patient with syncopal episode and normal cardiac enzymes.  Patient has mild dehydration that has improved.  He will follow-up with his PCP  {Document critical care time when appropriate:1} {Document review of labs and clinical decision tools ie heart score, Chads2Vasc2 etc:1}  {Document your independent review of radiology images, and any outside records:1} {Document your discussion with family members, caretakers, and with consultants:1} {Document social determinants of health affecting pt's care:1} {Document your decision making why or why not admission, treatments were  needed:1} Final Clinical Impression(s) / ED Diagnoses Final diagnoses:  Dehydration    Rx / DC Orders ED Discharge Orders     None

## 2024-02-05 NOTE — Discharge Instructions (Signed)
Drink fluids.  Follow-up with your family doctor this week for recheck.  Return if problems

## 2024-02-05 NOTE — ED Triage Notes (Signed)
[place] with reports of witnessed syncopal episode. Pt struck head on coffee table. Bystander states he was out for 10-15 seconds and diaphoretic. Pt has extensive cardiac hx, recent heart cath 2 weeks ago. EMS noticed prolonged QT wave. Pt HR 40-50 and pt reports his normal is 90-110.

## 2025-01-02 ENCOUNTER — Encounter (HOSPITAL_BASED_OUTPATIENT_CLINIC_OR_DEPARTMENT_OTHER): Payer: Self-pay | Admitting: Emergency Medicine

## 2025-01-02 ENCOUNTER — Emergency Department (HOSPITAL_BASED_OUTPATIENT_CLINIC_OR_DEPARTMENT_OTHER): Admitting: Radiology

## 2025-01-02 ENCOUNTER — Other Ambulatory Visit: Payer: Self-pay

## 2025-01-02 ENCOUNTER — Emergency Department (HOSPITAL_BASED_OUTPATIENT_CLINIC_OR_DEPARTMENT_OTHER)
Admission: EM | Admit: 2025-01-02 | Discharge: 2025-01-02 | Disposition: A | Discharge status: Home / Self Care | Attending: Emergency Medicine | Admitting: Emergency Medicine

## 2025-01-02 DIAGNOSIS — W19XXXA Unspecified fall, initial encounter: Secondary | ICD-10-CM | POA: Insufficient documentation

## 2025-01-02 DIAGNOSIS — S2232XA Fracture of one rib, left side, initial encounter for closed fracture: Secondary | ICD-10-CM | POA: Insufficient documentation

## 2025-01-02 DIAGNOSIS — Z7982 Long term (current) use of aspirin: Secondary | ICD-10-CM | POA: Insufficient documentation

## 2025-01-02 MED ORDER — ACETAMINOPHEN 500 MG PO TABS: 1000.0000 mg | ORAL_TABLET | Freq: Once | ORAL | Status: DC

## 2025-01-02 MED ORDER — IBUPROFEN 800 MG PO TABS
800.0000 mg | ORAL_TABLET | Freq: Once | ORAL | Status: AC
  Administered 2025-01-02: 800 mg via ORAL
  Filled 2025-01-02: qty 1

## 2025-01-02 MED ORDER — OXYCODONE-ACETAMINOPHEN 5-325 MG PO TABS
1.0000 | ORAL_TABLET | Freq: Once | ORAL | Status: AC
  Administered 2025-01-02: 1 via ORAL
  Filled 2025-01-02: qty 1

## 2025-01-02 MED ORDER — OXYCODONE-ACETAMINOPHEN 5-325 MG PO TABS: 1.0000 | ORAL_TABLET | Freq: Three times a day (TID) | ORAL | 0 refills | Status: AC | PRN

## 2025-01-02 NOTE — ED Triage Notes (Addendum)
 Patient c/o a mechanical fall this afternoon d/t his bad back.  Patient c/o left rib cage pain.  Patient states I think I broke a rib. Patient denies hitting head, denies LOC, denies thinners.

## 2025-01-02 NOTE — ED Provider Notes (Signed)
 " [place] EMERGENCY DEPARTMENT AT [facility] Provider Note   CSN: [id number] Arrival date & time: 01/02/25  1851     Patient presents with: Travis Armstrong is a 84 y.o. male.  Who presents to the ED after a fall.  Patient was collecting his garbage can when he lost his balance and fell outdoors next to his the house.  Landed on left side.  No head trauma loss consciousness.  No anticoagulation.  Now with left rib pain.  Denies shortness of breath.  He was able to get up under his own power and ambulate after the fall.    Fall       Prior to Admission medications  Medication Sig Start Date End Date Taking? Authorizing Provider  oxyCODONE -acetaminophen  (PERCOCET/ROXICET) 5-325 MG tablet Take 1 tablet by mouth every 8 (eight) hours as needed for up to 5 days for severe pain (pain score 7-10). 01/02/25 01/07/25 Yes Travis Sharper A, DO  ACIDOPHILUS LACTOBACILLUS PO Take 1 tablet by mouth 2 (two) times daily.    [provider]  aspirin  81 MG tablet Take 81 mg by mouth daily.     [provider]  atorvastatin  (LIPITOR ) 10 MG tablet Take 10 mg by mouth daily.    [provider]  gabapentin (NEURONTIN) 300 MG capsule Take 300 mg by mouth 3 (three) times daily.    [provider]  guaiFENesin -dextromethorphan (ROBITUSSIN DM) 100-10 MG/5ML syrup Take 10 mLs by mouth every 4 (four) hours as needed for cough. 06/28/22   Milissa Tod PARAS, MD  isosorbide  mononitrate (IMDUR ) 120 MG 24 hr tablet Take 120 mg by mouth daily.    [provider]  lisinopril  (ZESTRIL ) 20 MG tablet Take 20 mg by mouth daily.    [provider]  magnesium  gluconate (MAGONATE) 500 MG tablet Take 500 mg by mouth daily.     [provider]  Multiple Vitamins-Minerals (PRESERVISION AREDS 2) CAPS Take 2 capsules by mouth daily.    [provider]  nitroGLYCERIN  (NITROSTAT ) 0.4 MG SL tablet Place 1 tablet (0.4 mg total) under the tongue every 5  (five) minutes as needed for chest pain. Patient not taking: Reported on 06/24/2022 09/23/14   Rolan Ezra RAMAN, MD  ondansetron  (ZOFRAN ) 4 MG tablet Take 1 tablet (4 mg total) by mouth every 6 (six) hours as needed for nausea. 06/28/22   Milissa Tod PARAS, MD  vitamin B-12 (CYANOCOBALAMIN) 500 MCG tablet Take 1,000 mcg by mouth daily.    [provider]    Allergies: Ranexa  [ranolazine ], Penicillins, and Other    Review of Systems  Updated Vital Signs BP (!) 162/81 (BP Location: Right Arm)   Pulse 71   Temp 98.1 F (36.7 C) (Oral)   Resp (!) 22   Wt 93 kg   SpO2 96%   BMI 28.59 kg/m   Physical Exam Vitals and nursing note reviewed.  HENT:     Head: Normocephalic and atraumatic.  Eyes:     Pupils: Pupils are equal, round, and reactive to light.  Cardiovascular:     Rate and Rhythm: Normal rate and regular rhythm.  Pulmonary:     Effort: Pulmonary effort is normal.     Breath sounds: Normal breath sounds.  Abdominal:     Palpations: Abdomen is soft.     Tenderness: There is no abdominal tenderness.  Musculoskeletal:     Cervical back: Neck supple. No tenderness.     Comments: Left lateral  chest wall tenderness without deformity Good active range of motion and sensation intact throughout 4 extremities No midline tenderness step-off deformity back  Skin:    General: Skin is warm and dry.  Neurological:     Mental Status: He is alert.  Psychiatric:        Mood and Affect: Mood normal.     (all labs ordered are listed, but only abnormal results are displayed) Labs Reviewed - No data to display  EKG: None  Radiology: DG Ribs Unilateral W/Chest Left Result Date: 01/02/2025 EXAM: 1 AP VIEW(S) XRAY OF THE LEFT RIBS AND CHEST 01/02/2025 07:17:00 PM COMPARISON: None available. CLINICAL HISTORY: rib cage pain from fall FINDINGS: BONES: Acute posterior seventh rib fracture is noted with only mild displacement. Old healed left lateral 7th and 8th rib fractures.  Intrathecal stimulating device in thoracic spine. LUNGS AND PLEURA: No consolidation or pulmonary edema. No pleural effusion or pneumothorax. HEART AND MEDIASTINUM: Aortic atherosclerosis present. Median sternotomy changes and prosthetic aortic valve noted. IMPRESSION: 1. Acute posterior left seventh rib fracture with mild displacement. Electronically signed by: Travis Devonshire MD 01/02/2025 07:23 PM EST RP Workstation: [id number]     Procedures   Medications Ordered in the ED  ibuprofen  (ADVIL ) tablet 800 mg (800 mg Oral Given 01/02/25 2126)  oxyCODONE -acetaminophen  (PERCOCET/ROXICET) 5-325 MG per tablet 1 tablet (1 tablet Oral Given 01/02/25 2126)                                    Medical Decision Making 84 year old male with history as above presents to the ED for injuries after fall outdoors.  Mechanical fall lost his balance.  Landed on left side.  No head trauma or loss consciousness no anticoagulation.  Tender over left lateral ribs.  No other evidence of trauma.  X-ray does show fractured left seventh rib.  Counseled him on symptomatic management with Tylenol  Motrin  and will prescribe short course of Percocet at home.  Provided him with an incentive spirometer.  He will follow-up with his primary doctor  Amount and/or Complexity of Data Reviewed Radiology: ordered.  Risk Prescription drug management.        Final diagnoses:  Closed fracture of one rib of left side, initial encounter  Fall, initial encounter    ED Discharge Orders          Ordered    oxyCODONE -acetaminophen  (PERCOCET/ROXICET) 5-325 MG tablet  Every 8 hours PRN        01/02/25 2216               Travis Ozell LABOR, DO 01/02/25 2217  "

## 2025-01-02 NOTE — Discharge Instructions (Addendum)
 You were seen in the emergency room for injuries after fall You broke the left rib rib #7 No other obvious injuries Take Tylenol  Motrin  as directed for mild to moderate pain For severe pain we have called in a prescription for Percocet for you begin taking as directed for severe pain only Do not drink alcohol or drive with taking Percocet Pick up this medication from your pharmacy Use the incentive spirometer as discussed Follow-up with your primary doctor Return to the emerged permit for trouble breathing or severe pain
